# Patient Record
Sex: Female | Born: 1970 | Race: White | Hispanic: No | Marital: Married | Smoking: Never smoker
Health system: Southern US, Community
[De-identification: ages and names within clinical notes are randomized; demographics above are authoritative.]

## PROBLEM LIST (undated history)

## (undated) DIAGNOSIS — R059 Cough, unspecified: Secondary | ICD-10-CM

## (undated) DIAGNOSIS — T4145XA Adverse effect of unspecified anesthetic, initial encounter: Secondary | ICD-10-CM

## (undated) DIAGNOSIS — F32A Depression, unspecified: Secondary | ICD-10-CM

## (undated) DIAGNOSIS — D802 Selective deficiency of immunoglobulin A [IgA]: Secondary | ICD-10-CM

## (undated) DIAGNOSIS — G478 Other sleep disorders: Secondary | ICD-10-CM

## (undated) DIAGNOSIS — T8859XA Other complications of anesthesia, initial encounter: Secondary | ICD-10-CM

## (undated) DIAGNOSIS — R05 Cough: Secondary | ICD-10-CM

## (undated) DIAGNOSIS — F419 Anxiety disorder, unspecified: Secondary | ICD-10-CM

## (undated) DIAGNOSIS — M542 Cervicalgia: Secondary | ICD-10-CM

## (undated) DIAGNOSIS — D649 Anemia, unspecified: Secondary | ICD-10-CM

## (undated) DIAGNOSIS — J45909 Unspecified asthma, uncomplicated: Secondary | ICD-10-CM

## (undated) DIAGNOSIS — Z9889 Other specified postprocedural states: Secondary | ICD-10-CM

## (undated) DIAGNOSIS — N943 Premenstrual tension syndrome: Secondary | ICD-10-CM

## (undated) DIAGNOSIS — R112 Nausea with vomiting, unspecified: Secondary | ICD-10-CM

## (undated) DIAGNOSIS — K811 Chronic cholecystitis: Secondary | ICD-10-CM

## (undated) DIAGNOSIS — L409 Psoriasis, unspecified: Secondary | ICD-10-CM

## (undated) DIAGNOSIS — M25519 Pain in unspecified shoulder: Secondary | ICD-10-CM

## (undated) DIAGNOSIS — G500 Trigeminal neuralgia: Secondary | ICD-10-CM

## (undated) DIAGNOSIS — H9319 Tinnitus, unspecified ear: Secondary | ICD-10-CM

## (undated) DIAGNOSIS — L404 Guttate psoriasis: Secondary | ICD-10-CM

## (undated) DIAGNOSIS — F329 Major depressive disorder, single episode, unspecified: Secondary | ICD-10-CM

## (undated) DIAGNOSIS — J3489 Other specified disorders of nose and nasal sinuses: Secondary | ICD-10-CM

## (undated) DIAGNOSIS — J302 Other seasonal allergic rhinitis: Secondary | ICD-10-CM

## (undated) DIAGNOSIS — L309 Dermatitis, unspecified: Secondary | ICD-10-CM

## (undated) DIAGNOSIS — Z8489 Family history of other specified conditions: Secondary | ICD-10-CM

## (undated) DIAGNOSIS — J069 Acute upper respiratory infection, unspecified: Secondary | ICD-10-CM

## (undated) HISTORY — DX: Acute upper respiratory infection, unspecified: J06.9

## (undated) HISTORY — DX: Unspecified asthma, uncomplicated: J45.909

## (undated) HISTORY — DX: Depression, unspecified: F32.A

## (undated) HISTORY — DX: Premenstrual tension syndrome: N94.3

## (undated) HISTORY — DX: Cervicalgia: M54.2

## (undated) HISTORY — PX: WISDOM TOOTH EXTRACTION: SHX21

## (undated) HISTORY — DX: Major depressive disorder, single episode, unspecified: F32.9

## (undated) HISTORY — DX: Pain in unspecified shoulder: M25.519

## (undated) HISTORY — DX: Anxiety disorder, unspecified: F41.9

## (undated) HISTORY — DX: Selective deficiency of immunoglobulin a (iga): D80.2

## (undated) HISTORY — DX: Trigeminal neuralgia: G50.0

## (undated) HISTORY — DX: Other seasonal allergic rhinitis: J30.2

## (undated) HISTORY — DX: Dermatitis, unspecified: L30.9

## (undated) HISTORY — DX: Other specified disorders of nose and nasal sinuses: J34.89

## (undated) HISTORY — DX: Other sleep disorders: G47.8

## (undated) HISTORY — DX: Anemia, unspecified: D64.9

## (undated) HISTORY — DX: Guttate psoriasis: L40.4

## (undated) SURGERY — ERCP, WITH INTERVENTION IF INDICATED
Anesthesia: General

---

## 1991-01-05 DIAGNOSIS — L404 Guttate psoriasis: Secondary | ICD-10-CM

## 1991-01-05 HISTORY — DX: Guttate psoriasis: L40.4

## 2011-01-07 ENCOUNTER — Other Ambulatory Visit: Payer: Self-pay | Admitting: Otolaryngology

## 2011-01-07 DIAGNOSIS — R1310 Dysphagia, unspecified: Secondary | ICD-10-CM

## 2011-01-07 DIAGNOSIS — R220 Localized swelling, mass and lump, head: Secondary | ICD-10-CM

## 2011-01-07 DIAGNOSIS — F458 Other somatoform disorders: Secondary | ICD-10-CM

## 2011-01-12 ENCOUNTER — Ambulatory Visit
Admission: RE | Admit: 2011-01-12 | Discharge: 2011-01-12 | Disposition: A | Discharge status: Home / Self Care | Payer: BC Managed Care – PPO | Source: Ambulatory Visit | Attending: Otolaryngology | Admitting: Otolaryngology

## 2011-01-12 DIAGNOSIS — F458 Other somatoform disorders: Secondary | ICD-10-CM

## 2011-01-12 DIAGNOSIS — R1310 Dysphagia, unspecified: Secondary | ICD-10-CM

## 2011-01-12 DIAGNOSIS — R221 Localized swelling, mass and lump, neck: Secondary | ICD-10-CM

## 2011-01-12 MED ORDER — IOHEXOL 300 MG/ML  SOLN
75.0000 mL | Freq: Once | INTRAMUSCULAR | Status: AC | PRN
  Administered 2011-01-12: 75 mL via INTRAVENOUS

## 2011-12-16 ENCOUNTER — Encounter: Payer: Self-pay | Admitting: Obstetrics and Gynecology

## 2011-12-16 ENCOUNTER — Ambulatory Visit (INDEPENDENT_AMBULATORY_CARE_PROVIDER_SITE_OTHER): Payer: BC Managed Care – PPO | Admitting: Obstetrics and Gynecology

## 2011-12-16 VITALS — BP 130/70 | HR 80 | Ht 65.75 in | Wt 147.0 lb

## 2011-12-16 DIAGNOSIS — Z124 Encounter for screening for malignant neoplasm of cervix: Secondary | ICD-10-CM

## 2011-12-16 MED ORDER — CYCLOBENZAPRINE HCL 5 MG PO TABS: 5.0000 mg | ORAL_TABLET | Freq: Three times a day (TID) | ORAL | Status: DC | PRN

## 2011-12-16 MED ORDER — BUPROPION HCL ER (XL) 150 MG PO TB24: 150.0000 mg | ORAL_TABLET | Freq: Every day | ORAL | Status: DC

## 2011-12-16 NOTE — Patient Instructions (Signed)
Muscle Strain °Muscle strain occurs when a muscle is stretched beyond its normal length. A small number of muscle fibers generally are torn. This is especially common in athletes. This happens when a sudden, violent force placed on a muscle stretches it too far. Usually, recovery from muscle strain takes 1 to 2 weeks. Complete healing will take 5 to 6 weeks.  °HOME CARE INSTRUCTIONS  °· While awake, apply ice to the sore muscle for the first 2 days after the injury. °· Put ice in a plastic bag. °· Place a towel between your skin and the bag. °· Leave the ice on for 15 to 20 minutes each hour. °· Do not use the strained muscle for several days, until you no longer have pain. °· You may wrap the injured area with an elastic bandage for comfort. Be careful not to wrap it too tightly. This may interfere with blood circulation or increase swelling. °· Only take over-the-counter or prescription medicines for pain, discomfort, or fever as directed by your caregiver. °SEEK MEDICAL CARE IF:  °You have increasing pain or swelling in the injured area. °MAKE SURE YOU:  °· Understand these instructions. °· Will watch your condition. °· Will get help right away if you are not doing well or get worse. °Document Released: 12/21/2004 Document Revised: 03/15/2011 Document Reviewed: 01/02/2011 °ExitCare® Patient Information ©2013 [employer]. ° °

## 2011-12-16 NOTE — Progress Notes (Signed)
Last Pap: 2007 per pt WNL: Yes Regular Periods:yes Contraception: vasectomy   Monthly Breast exam:no Tetanus<18yrs:yes Nl.Bladder Function:yes Daily BMs:yes Healthy Diet:yes Calcium:no Mammogram:no Date of Mammogram:  Exercise:yes Have often Exercise: 1-2 times per week  Seatbelt: yes Abuse at home: no Stressful work:no Sigmoid-colonoscopy: n/a Bone Density: No PCP: none, pt would like referral  Change in PMH: n/a  Change in FMH:n/a  BP 130/70  Pulse 80  Ht 5' 5.75" (1.67 m)  Wt 147 lb (66.679 kg)  BMI 23.91 kg/m2  LMP 12/06/2011 Pt with complaints:1.  She has seasonal affetive disorder.  Every winter she becomes depressed and doesn't feel like connecting with people.  She has crying spells. She can not concentrate and she is snippy with her children  No HI or SI.  She used zoloft in the the past and it didn't help.  wellbutrin helped to prevent pp depression.  This has been present for 20 yrs.  2. She has a muscle stran in her neck.  Massage and flexeril helps.  No sharp pain.  She thinks its form stress 3. She is having problems sleeping.  She does not use electronics one hr before bed.  Shehas no abnormal lights on.  No caffeine.  occ she exercises before dinner Physical Examination: General appearance - alert, well appearing, and in no distress Mental status - normal mood, behavior, speech, dress, motor activity, and thought processes Neck - supple, no significant adenopathy,  thyroid exam: thyroid is normal in size without nodules or tenderness Chest - clear to auscultation, no wheezes, rales or rhonchi, symmetric air entry Heart - normal rate and regular rhythm Abdomen - soft, nontender, nondistended, no masses or organomegaly Breasts - breasts appear normal, no suspicious masses, no skin or nipple changes or axillary nodes Pelvic - normal external genitalia, vulva, vagina, cervix, uterus and adnexa Rectal - normal rectal, no masses, rectal exam not indicated Back exam -  full range of motion, no tenderness, palpable spasm or pain on motion Neurological - alert, oriented, normal speech, no focal findings or movement disorder noted Musculoskeletal - no joint tenderness, deformity or swelling Extremities - no edema, redness or tenderness in the calves or thighs Skin - normal coloration and turgor, no rashes, no suspicious skin lesions noted Routine exam Depression.  Pt referred for psycho therapy and placed on wellbutrin.  Also told to consider phototherapy Problems sleeping.  She was told to try going to bed earlier and pt exercise at night Muscle strain.  Pt given flexeril.  She declined PT eval Pap sent yes Mammogram due yes scheduled vasectomy used for contraception RT 2 weeks

## 2011-12-17 LAB — PAP IG W/ RFLX HPV ASCU

## 2011-12-21 ENCOUNTER — Telehealth: Payer: Self-pay

## 2011-12-21 NOTE — Telephone Encounter (Signed)
Tc to pt regarding pap results, advised pt that pap was normal but yeast was found. Advised pt that she could be treated by either taking 1 pill or trying over the counter treatment. Pt opted to try over the counter treatment.

## 2011-12-22 ENCOUNTER — Ambulatory Visit
Admission: RE | Admit: 2011-12-22 | Discharge: 2011-12-22 | Disposition: A | Discharge status: Home / Self Care | Payer: BC Managed Care – PPO | Source: Ambulatory Visit | Attending: Obstetrics and Gynecology | Admitting: Obstetrics and Gynecology

## 2011-12-22 DIAGNOSIS — Z124 Encounter for screening for malignant neoplasm of cervix: Secondary | ICD-10-CM

## 2012-01-03 ENCOUNTER — Encounter: Payer: Self-pay | Admitting: Obstetrics and Gynecology

## 2012-01-10 ENCOUNTER — Encounter: Payer: BC Managed Care – PPO | Admitting: Obstetrics and Gynecology

## 2012-01-12 ENCOUNTER — Encounter: Payer: BC Managed Care – PPO | Admitting: Obstetrics and Gynecology

## 2012-01-27 ENCOUNTER — Encounter: Payer: BC Managed Care – PPO | Admitting: Obstetrics and Gynecology

## 2012-02-05 ENCOUNTER — Other Ambulatory Visit: Payer: Self-pay | Admitting: Obstetrics and Gynecology

## 2013-08-13 ENCOUNTER — Encounter (HOSPITAL_COMMUNITY): Payer: Self-pay | Admitting: Emergency Medicine

## 2013-08-13 ENCOUNTER — Emergency Department (HOSPITAL_COMMUNITY)
Admission: EM | Admit: 2013-08-13 | Discharge: 2013-08-13 | Disposition: A | Discharge status: Home / Self Care | Payer: BC Managed Care – PPO | Source: Home / Self Care | Attending: Family Medicine | Admitting: Family Medicine

## 2013-08-13 DIAGNOSIS — S61209A Unspecified open wound of unspecified finger without damage to nail, initial encounter: Secondary | ICD-10-CM

## 2013-08-13 DIAGNOSIS — IMO0002 Reserved for concepts with insufficient information to code with codable children: Secondary | ICD-10-CM

## 2013-08-13 DIAGNOSIS — S61011A Laceration without foreign body of right thumb without damage to nail, initial encounter: Secondary | ICD-10-CM

## 2013-08-13 NOTE — ED Provider Notes (Signed)
Medical screening examination/treatment/procedure(s) were performed by a resident physician or non-physician practitioner and as the supervising physician I was immediately available for consultation/collaboration.  Erienne Spelman, MD Family Medicine   Aevah Stansbery J Janye Maynor, MD 08/13/13 2138 

## 2013-08-13 NOTE — ED Notes (Signed)
Patient reports moving furniture, hit right thumb into the edge of a desk.  Partial avulsion to area beside the nail and laceration extending into nail.  Bleeding controlled.  Last tetanus was less than 2 years ago

## 2013-08-13 NOTE — Discharge Instructions (Signed)
Sutured Wound Care °Sutures are stitches that can be used to close wounds. Wound care helps prevent pain and infection.  °HOME CARE INSTRUCTIONS  °· Rest and elevate the injured area until all the pain and swelling are gone. °· Only take over-the-counter or prescription medicines for pain, discomfort, or fever as directed by your caregiver. °· After 48 hours, gently wash the area with mild soap and water once a day, or as directed. Rinse off the soap. Pat the area dry with a clean towel. Do not rub the wound. This may cause bleeding. °· Follow your caregiver's instructions for how often to change the bandage (dressing). Stop using a dressing after 2 days or after the wound stops draining. °· If the dressing sticks, moisten it with soapy water and gently remove it. °· Apply ointment on the wound as directed. °· Avoid stretching a sutured wound. °· Drink enough fluids to keep your urine clear or pale yellow. °· Follow up with your caregiver for suture removal as directed. °· Use sunscreen on your wound for the next 3 to 6 months so the scar will not darken. °SEEK IMMEDIATE MEDICAL CARE IF:  °· Your wound becomes red, swollen, hot, or tender. °· You have increasing pain in the wound. °· You have a red streak that extends from the wound. °· There is pus coming from the wound. °· You have a fever. °· You have shaking chills. °· There is a bad smell coming from the wound. °· You have persistent bleeding from the wound. °MAKE SURE YOU:  °· Understand these instructions. °· Will watch your condition. °· Will get help right away if you are not doing well or get worse. °Document Released: 01/29/2004 Document Revised: 03/15/2011 Document Reviewed: 04/26/2010 °ExitCare® Patient Information ©2015 [employer]. This information is not intended to replace advice given to you by your health care provider. Make sure you discuss any questions you have with your health care provider. ° °

## 2013-08-13 NOTE — ED Notes (Signed)
Patient laceration dressed with non adherent tefla and 1' coban.

## 2013-08-13 NOTE — ED Provider Notes (Signed)
CSN: [id number]     Arrival date & time 08/13/13  1055 History   First MD Initiated Contact with Patient 08/13/13 1119     Chief Complaint  Patient presents with  . Laceration   (Consider location/radiation/quality/duration/timing/severity/associated sxs/prior Treatment) HPI Comments: Last tetanus 1.5 years ago.  Patient is a 43 y.o. female presenting with skin laceration. The history is provided by the patient.  Laceration Location:  Hand Hand laceration location:  R finger Length (cm):  1 Depth:  Through dermis Quality: avulsion   Bleeding: controlled   Time since incident:  1 hour Injury mechanism: Cut on edge of desk while moving furniture at work P[employer].   Past Medical History  Diagnosis Date  . Depression   . Poor sleep pattern   . Neck pain   . Shoulder pain   . PMS (premenstrual syndrome)    Past Surgical History  Procedure Laterality Date  . Wisdom tooth extraction     Family History  Problem Relation Age of Onset  . Heart disease Paternal Grandfather   . Diabetes Paternal Grandfather   . Heart disease Paternal Grandmother   . Diabetes Paternal Grandmother   . Hernia Father   . Diabetes Father   . Other Father     joint problems  . Thyroid disease Mother   . Other Mother     blood transfusion  . Hepatitis C Mother     contracted from blood transfusion  . Thyroid disease Maternal Aunt   . Thyroid disease Maternal Aunt   . Other Paternal Aunt     joint problems   History  Substance Use Topics  . Smoking status: Never Smoker   . Smokeless tobacco: Not on file  . Alcohol Use: 1.0 - 2.0 oz/week    2-4 drink(s) per week   OB History   Grav Para Term Preterm Abortions TAB SAB Ect Mult Living   2 2        2      Review of Systems  All other systems reviewed and are negative.   Allergies  Other  Home Medications   Prior to Admission medications   Medication Sig Start Date End Date Taking? Authorizing Provider  buPROPion (WELLBUTRIN XL) 150 MG  24 hr tablet Take 1 tablet (150 mg total) by mouth daily. 12/16/11   Naima A Dillard, MD  cyclobenzaprine (FLEXERIL) 5 MG tablet Take 1 tablet (5 mg total) by mouth 3 (three) times daily as needed for muscle spasms. 12/16/11   Naima A Dillard, MD   BP 144/90  Pulse 86  Temp(Src) 98.8 F (37.1 C) (Oral)  Resp 16  SpO2 100%  LMP 08/13/2013 Physical Exam  Nursing note and vitals reviewed. Constitutional: She is oriented to person, place, and time. She appears well-developed and well-nourished. No distress.  HENT:  Head: Normocephalic and atraumatic.  Eyes: Conjunctivae are normal.  Cardiovascular: Normal rate.   Pulmonary/Chest: Effort normal.  Musculoskeletal: Normal range of motion.  Neurological: She is alert and oriented to person, place, and time.  Skin: Skin is warm and dry.  1 cm avulsion type laceration to distal lateral right thumb along edge of nailbed with partial laceration to lateral thumb nail.   Psychiatric: She has a normal mood and affect. Her behavior is normal.    ED Course  LACERATION REPAIR Date/Time: 08/13/2013 1:08 PM Performed by: Lemmie EvensPRESSON, Ikenna Ohms LEE H Authorized by: Lemmie EvensPRESSON, Aydenn Gervin LEE H Consent: Verbal consent obtained. written consent not obtained. Risks and benefits: risks, benefits  and alternatives were discussed Consent given by: patient Patient understanding: patient states understanding of the procedure being performed Patient identity confirmed: verbally with patient Time out: Immediately prior to procedure a "time out" was called to verify the correct patient, procedure, equipment, support staff and site/side marked as required. Body area: upper extremity Location details: right thumb Laceration length: 1 cm Foreign bodies: no foreign bodies Tendon involvement: none Nerve involvement: none Vascular damage: no Anesthesia: local infiltration Local anesthetic: lidocaine 2% without epinephrine Anesthetic total: 0.5 ml Patient sedated:  no Preparation: Patient was prepped and draped in the usual sterile fashion. Irrigation solution: saline Irrigation method: syringe Amount of cleaning: standard Debridement: none Degree of undermining: none Skin closure: 4-0 Prolene Number of sutures: 3 Technique: simple Approximation: close Approximation difficulty: simple Dressing: antibiotic ointment and tube gauze Patient tolerance: Patient tolerated the procedure well with no immediate complications.   (including critical care time) Labs Review Labs Reviewed - No data to display  Imaging Review No results found.   MDM   1. Laceration of right thumb, initial encounter    Laceration care instructions provided to patient. Suture removal in one week.     Ria Clock, [place] 08/13/13 1310

## 2013-08-20 ENCOUNTER — Emergency Department (INDEPENDENT_AMBULATORY_CARE_PROVIDER_SITE_OTHER)
Admission: EM | Admit: 2013-08-20 | Discharge: 2013-08-20 | Disposition: A | Discharge status: Home / Self Care | Payer: BC Managed Care – PPO | Source: Home / Self Care | Attending: Family Medicine | Admitting: Family Medicine

## 2013-08-20 ENCOUNTER — Encounter (HOSPITAL_COMMUNITY): Payer: Self-pay | Admitting: Emergency Medicine

## 2013-08-20 DIAGNOSIS — Z4802 Encounter for removal of sutures: Secondary | ICD-10-CM

## 2013-08-20 NOTE — ED Provider Notes (Signed)
Beth MuscaRachel Rivera is a 43 y.o. female who presents to Urgent Care today for suture removal. Patient had sutures placed into her left thumb about one week ago. She's feeling well. No pain.   Past Medical History  Diagnosis Date  . Depression   . Poor sleep pattern   . Neck pain   . Shoulder pain   . PMS (premenstrual syndrome)    History  Substance Use Topics  . Smoking status: Never Smoker   . Smokeless tobacco: Not on file  . Alcohol Use: 1.0 - 2.0 oz/week    2-4 drink(s) per week   ROS as above Medications: No current facility-administered medications for this encounter.   Current Outpatient Prescriptions  Medication Sig Dispense Refill  . buPROPion (WELLBUTRIN XL) 150 MG 24 hr tablet Take 1 tablet (150 mg total) by mouth daily.  30 tablet  0  . cyclobenzaprine (FLEXERIL) 5 MG tablet Take 1 tablet (5 mg total) by mouth 3 (three) times daily as needed for muscle spasms.  30 tablet  0    Exam:  BP 138/78  Pulse 96  Temp(Src) 98.5 F (36.9 C) (Oral)  Resp 14  SpO2 98%  LMP 08/13/2013 Gen: Well NAD Left thumb: well-appearing laceration. No erythema exudate. Minimally tender. Normal motion. 3 sutures removed No results found for this or any previous visit (from the past 24 hour(s)). No results found.  Assessment and Plan: 43 y.o. female with thumb laceration. Sutures removed. Wound care instructions provided. Followup as needed  Discussed warning signs or symptoms. Please see discharge instructions. Patient expresses understanding.   This note was created using Conservation officer, historic buildingsDragon voice recognition software. Any transcription errors are unintended.    Rodolph BongEvan S Iker Nuttall, MD 08/20/13 [phone]

## 2013-08-20 NOTE — Discharge Instructions (Signed)
Thank you for coming in today. Keep the wound covered in ointment.  The nail should grow back normally but they may be a small chance that it will look funny.    Suture Removal, Care After Refer to this sheet in the next few weeks. These instructions provide you with information on caring for yourself after your procedure. Your health care provider may also give you more specific instructions. Your treatment has been planned according to current medical practices, but problems sometimes occur. Call your health care provider if you have any problems or questions after your procedure. WHAT TO EXPECT AFTER THE PROCEDURE After your stitches (sutures) are removed, it is typical to have the following:  Some discomfort and swelling in the wound area.  Slight redness in the area. HOME CARE INSTRUCTIONS   If you have skin adhesive strips over the wound area, do not take the strips off. They will fall off on their own in a few days. If the strips remain in place after 14 days, you may remove them.  Change any bandages (dressings) at least once a day or as directed by your health care provider. If the bandage sticks, soak it off with warm, soapy water.  Apply cream or ointment only as directed by your health care provider. If using cream or ointment, wash the area with soap and water 2 times a day to remove all the cream or ointment. Rinse off the soap and pat the area dry with a clean towel.  Keep the wound area dry and clean. If the bandage becomes wet or dirty, or if it develops a bad smell, change it as soon as possible.  Continue to protect the wound from injury.  Use sunscreen when out in the sun. New scars become sunburned easily. SEEK MEDICAL CARE IF:  You have increasing redness, swelling, or pain in the wound.  You see pus coming from the wound.  You have a fever.  You notice a bad smell coming from the wound or dressing.  Your wound breaks open (edges not staying  together). Document Released: 09/15/2000 Document Revised: 10/11/2012 Document Reviewed: 08/02/2012 [facility] Patient Information 2015 [place], [place]. This information is not intended to replace advice given to you by your health care provider. Make sure you discuss any questions you have with your health care provider.

## 2013-08-20 NOTE — ED Notes (Signed)
Pt is here for a f/u and to have stitches removed from left thumb [facility] Vo[facility] no new concerns Alert; no signs of acute distress.

## 2013-11-05 ENCOUNTER — Encounter (HOSPITAL_COMMUNITY): Payer: Self-pay | Admitting: Emergency Medicine

## 2015-01-03 ENCOUNTER — Encounter (HOSPITAL_COMMUNITY): Payer: Self-pay | Admitting: Oncology

## 2015-01-03 ENCOUNTER — Emergency Department (HOSPITAL_COMMUNITY): Payer: BLUE CROSS/BLUE SHIELD

## 2015-01-03 ENCOUNTER — Emergency Department (HOSPITAL_COMMUNITY)
Admission: EM | Admit: 2015-01-03 | Discharge: 2015-01-03 | Disposition: A | Discharge status: Home / Self Care | Payer: BLUE CROSS/BLUE SHIELD | Attending: Emergency Medicine | Admitting: Emergency Medicine

## 2015-01-03 DIAGNOSIS — N390 Urinary tract infection, site not specified: Secondary | ICD-10-CM | POA: Insufficient documentation

## 2015-01-03 DIAGNOSIS — Z8669 Personal history of other diseases of the nervous system and sense organs: Secondary | ICD-10-CM | POA: Diagnosis not present

## 2015-01-03 DIAGNOSIS — F329 Major depressive disorder, single episode, unspecified: Secondary | ICD-10-CM | POA: Diagnosis not present

## 2015-01-03 DIAGNOSIS — K8070 Calculus of gallbladder and bile duct without cholecystitis without obstruction: Secondary | ICD-10-CM | POA: Insufficient documentation

## 2015-01-03 DIAGNOSIS — Z8742 Personal history of other diseases of the female genital tract: Secondary | ICD-10-CM | POA: Insufficient documentation

## 2015-01-03 DIAGNOSIS — R1011 Right upper quadrant pain: Secondary | ICD-10-CM | POA: Diagnosis present

## 2015-01-03 LAB — COMPREHENSIVE METABOLIC PANEL
ALBUMIN: 4.2 g/dL (ref 3.5–5.0)
ALT: 15 U/L (ref 14–54)
AST: 20 U/L (ref 15–41)
Alkaline Phosphatase: 53 U/L (ref 38–126)
Anion gap: 7 (ref 5–15)
BUN: 10 mg/dL (ref 6–20)
CALCIUM: 8.8 mg/dL — AB (ref 8.9–10.3)
CHLORIDE: 104 mmol/L (ref 101–111)
CO2: 27 mmol/L (ref 22–32)
CREATININE: 0.72 mg/dL (ref 0.44–1.00)
GFR calc Af Amer: >60 mL/min (ref >60)
GFR calc non Af Amer: >60 mL/min (ref >60)
GLUCOSE: 108 mg/dL — AB (ref 65–99)
Potassium: 3.8 mmol/L (ref 3.5–5.1)
SODIUM: 138 mmol/L (ref 135–145)
Total Bilirubin: 0.5 mg/dL (ref 0.3–1.2)
Total Protein: 7.5 g/dL (ref 6.5–8.1)

## 2015-01-03 LAB — CBC
HCT: 35.5 % — ABNORMAL LOW (ref 36.0–46.0)
HEMOGLOBIN: 11.5 g/dL — AB (ref 12.0–15.0)
MCH: 27.4 pg (ref 26.0–34.0)
MCHC: 32.4 g/dL (ref 30.0–36.0)
MCV: 84.5 fL (ref 78.0–100.0)
Platelets: 300 10*3/uL (ref 150–400)
RBC: 4.2 MIL/uL (ref 3.87–5.11)
RDW: 15.5 % (ref 11.5–15.5)
WBC: 6.3 10*3/uL (ref 4.0–10.5)

## 2015-01-03 LAB — URINE MICROSCOPIC-ADD ON

## 2015-01-03 LAB — URINALYSIS, ROUTINE W REFLEX MICROSCOPIC
Bilirubin Urine: NEGATIVE
GLUCOSE, UA: NEGATIVE mg/dL
Ketones, ur: NEGATIVE mg/dL
Leukocytes, UA: NEGATIVE
Nitrite: POSITIVE — AB
Protein, ur: NEGATIVE mg/dL
SPECIFIC GRAVITY, URINE: 1.021 (ref 1.005–1.030)
pH: 6 (ref 5.0–8.0)

## 2015-01-03 LAB — LIPASE, BLOOD: Lipase: 40 U/L (ref 11–51)

## 2015-01-03 MED ORDER — HYDROCODONE-ACETAMINOPHEN 5-325 MG PO TABS: 1.0000 | ORAL_TABLET | Freq: Four times a day (QID) | ORAL | Status: DC | PRN

## 2015-01-03 MED ORDER — CEPHALEXIN 500 MG PO CAPS: 500.0000 mg | ORAL_CAPSULE | Freq: Three times a day (TID) | ORAL | Status: DC

## 2015-01-03 MED ORDER — CEPHALEXIN 500 MG PO CAPS
500.0000 mg | ORAL_CAPSULE | Freq: Once | ORAL | Status: AC
  Administered 2015-01-03: 500 mg via ORAL
  Filled 2015-01-03: qty 1

## 2015-01-03 NOTE — Discharge Instructions (Signed)
Take motrin for pain.  Take vicodin for severe pain.  Take keflex three times daily for a week for UTI.   See general surgery for follow up.   Return to ER if you have severe abdominal pain, vomiting, fevers.    Biliary Colic Biliary colic is a pain in the upper abdomen. The pain:  Is usually felt on the right side of the abdomen, but it may also be felt in the center of the abdomen, just below the breastbone (sternum).  May spread back toward the right shoulder blade.  May be steady or irregular.  May be accompanied by nausea and vomiting. Most of the time, the pain goes away in 1-5 hours. After the most intense pain passes, the abdomen may continue to ache mildly for about 24 hours. Biliary colic is caused by a blockage in the bile duct. The bile duct is a pathway that carries bile--a liquid that helps to digest fats--from the gallbladder to the small intestine. Biliary colic usually occurs after eating, when the digestive system demands bile. The pain develops when muscle cells contract forcefully to try to move the blockage so that bile can get by. HOME CARE INSTRUCTIONS  Take medicines only as directed by your health care provider.  Drink enough fluid to keep your urine clear or pale yellow.  Avoid fatty, greasy, and fried foods. These kinds of foods increase your body's demand for bile.  Avoid any foods that make your pain worse.  Avoid overeating.  Avoid having a large meal after fasting. SEEK MEDICAL CARE IF:  You develop a fever.  Your pain gets worse.  You vomit.  You develop nausea that prevents you from eating and drinking. SEEK IMMEDIATE MEDICAL CARE IF:  You suddenly develop a fever and shaking chills.  You develop a yellowish discoloration (jaundice) of:  Skin.  Whites of the eyes.  Mucous membranes.  You have continuous or severe pain that is not relieved with medicines.  You have nausea and vomiting that is not relieved with  medicines.  You develop dizziness or you faint.   This information is not intended to replace advice given to you by your health care provider. Make sure you discuss any questions you have with your health care provider.   Document Released: 05/24/2005 Document Revised: 05/07/2014 Document Reviewed: 10/02/2013 Else[employer] Interactive Patient Education [employer]2016 [employer]

## 2015-01-03 NOTE — ED Provider Notes (Signed)
CSN: [id number]     Arrival date & time 01/03/15  1850 History   First MD Initiated Contact with Patient 01/03/15 2106     Chief Complaint  Patient presents with  . Abdominal Pain     (Consider location/radiation/quality/duration/timing/severity/associated sxs/prior Treatment) The history is provided by the patient.  Beth Rivera is a 44 y.o. female hx of depression, here with RUQ pain, flank pain. Patient states that she has intermittent right upper quadrant pain radiating to the right flank for several weeks. She had about 4-5 episodes since November. She states that usually is at night and woke her up from sleep. She did have several episodes of pain after she eats usually about 2-3 hours after she eats. Today she ate lunch around noon and had some pain around 5 PM that was more severe than usual. She was nauseated but did not vomit. She denies any urinary symptoms. No history of gallstones but she thought she may have some gallstones.    Past Medical History  Diagnosis Date  . Depression   . Poor sleep pattern   . Neck pain   . Shoulder pain   . PMS (premenstrual syndrome)    Past Surgical History  Procedure Laterality Date  . Wisdom tooth extraction     Family History  Problem Relation Age of Onset  . Heart disease Paternal Grandfather   . Diabetes Paternal Grandfather   . Heart disease Paternal Grandmother   . Diabetes Paternal Grandmother   . Hernia Father   . Diabetes Father   . Other Father     joint problems  . Thyroid disease Mother   . Other Mother     blood transfusion  . Hepatitis C Mother     contracted from blood transfusion  . Thyroid disease Maternal Aunt   . Thyroid disease Maternal Aunt   . Other Paternal Aunt     joint problems   Social History  Substance Use Topics  . Smoking status: Never Smoker   . Smokeless tobacco: Never Used  . Alcohol Use: 1.0 - 2.0 oz/week    2-4 Standard drinks or equivalent per week   OB History    Gravida Para  Term Preterm AB TAB SAB Ectopic Multiple Living   2 2        2      Review of Systems  Gastrointestinal: Positive for abdominal pain.  All other systems reviewed and are negative.     Allergies  Other  Home Medications   Prior to Admission medications   Medication Sig Start Date End Date Taking? Authorizing Provider  famotidine (PEPCID) 20 MG tablet Take 20 mg by mouth daily as needed for heartburn or indigestion.   Yes Historical Provider, MD  ibuprofen (ADVIL,MOTRIN) 200 MG tablet Take 400 mg by mouth every 6 (six) hours as needed for moderate pain.   Yes Historical Provider, MD  buPROPion (WELLBUTRIN XL) 150 MG 24 hr tablet Take 1 tablet (150 mg total) by mouth daily. Patient not taking: Reported on 01/03/2015 12/16/11   Jaymes GraffNaima Dillard, MD  cyclobenzaprine (FLEXERIL) 5 MG tablet Take 1 tablet (5 mg total) by mouth 3 (three) times daily as needed for muscle spasms. Patient not taking: Reported on 01/03/2015 12/16/11   Jaymes GraffNaima Dillard, MD   BP 137/92 mmHg  Pulse 80  Temp(Src) 98.3 F (36.8 C) (Oral)  Resp 20  Ht 5\' 6"  (1.676 m)  Wt 150 lb (68.04 kg)  BMI 24.22 kg/m2  SpO2 100%  LMP  11/12/2014 (Approximate) Physical Exam  Constitutional: She is oriented to person, place, and time. She appears well-developed and well-nourished.  HENT:  Head: Normocephalic.  Mouth/Throat: Oropharynx is clear and moist.  Eyes: Conjunctivae are normal. Pupils are equal, round, and reactive to light.  Neck: Normal range of motion. Neck supple.  Cardiovascular: Normal rate, regular rhythm and normal heart sounds.   Pulmonary/Chest: Effort normal and breath sounds normal. No respiratory distress. She has no wheezes. She has no rales.  Abdominal: Soft. Bowel sounds are normal.  Mild RUQ tenderness, no rebound. No CVAT   Musculoskeletal: Normal range of motion. She exhibits no edema or tenderness.  Neurological: She is alert and oriented to person, place, and time. No cranial nerve deficit.  Coordination normal.  Skin: Skin is warm and dry.  Psychiatric: She has a normal mood and affect. Her behavior is normal. Judgment and thought content normal.  Nursing note and vitals reviewed.   ED Course  Procedures (including critical care time) Labs Review Labs Reviewed  COMPREHENSIVE METABOLIC PANEL - Abnormal; Notable for the following:    Glucose, Bld 108 (*)    Calcium 8.8 (*)    All other components within normal limits  CBC - Abnormal; Notable for the following:    Hemoglobin 11.5 (*)    HCT 35.5 (*)    All other components within normal limits  URINALYSIS, ROUTINE W REFLEX MICROSCOPIC (NOT AT [facility]) - Abnormal; Notable for the following:    APPearance CLOUDY (*)    Hgb urine dipstick TRACE (*)    Nitrite POSITIVE (*)    All other components within normal limits  URINE MICROSCOPIC-ADD ON - Abnormal; Notable for the following:    Squamous Epithelial / LPF 0-5 (*)    Bacteria, UA MANY (*)    Crystals CA OXALATE CRYSTALS (*)    All other components within normal limits  LIPASE, BLOOD    Imaging Review US Abdomen Complete  01/03/2015  CLINICAL DATA:  44 year old female with history of right upper quadrant abdominal pain for the past 6 weeks. EXAM: ABDOMEN ULTRASOUND COMPLETE COMPARISON:  No priors. FINDINGS: Gallbladder: Multiple echogenic foci with posterior acoustic shadowing, compatible with gallstones, largest of which measures up to 3.1 cm. Gallbladder does not appear distended. Gallbladder wall thickness is normal at 2.4 mm. No pericholecystic fluid. Per report from the sonographer, the patient did not exhibit a sonographic Murphy's sign on examination. Common bile duct: Diameter: 2 mm in the porta hepatis. Liver: No focal lesion identified. Within normal limits in parenchymal echogenicity. IVC: No abnormality visualized. Pancreas: Visualized portion unremarkable. Spleen: Size and appearance within normal limits.  6.7 cm in length. Right Kidney: Length: 12.3 cm.  Echogenicity within normal limits. No mass or hydronephrosis visualized. Left Kidney: Length: 11.7 cm. Echogenicity within normal limits. No mass or hydronephrosis visualized. Abdominal aorta: No aneurysm visualized. Other findings: None. IMPRESSION: 1. Study is positive for cholelithiasis, but there are no findings to suggest an acute cholecystitis at this time. 2. Otherwise normal abdominal ultrasound. Electronically Signed   By: Trudie Reed M.D.   On: 01/03/2015 22:05   I have personally reviewed and evaluated these images and lab results as part of my medical decision-making.   EKG Interpretation None      MDM   Final diagnoses:  None   Beth Rivera is a 44 y.o. female here with RUQ pain. Likely biliary colic vs renal colic. Will get labs, UA, RUQ [place].   10:46 PM US showed cholelithiasis no  acute chole. UA + UTI but has no CVAT. WBC nl. LFTs unremarkable. Doesn't want pain meds in the ED. Will dc home with keflex, vicodin, surgery f/u.      Richardean Canal, MD 01/03/15 [phone]

## 2015-01-03 NOTE — ED Notes (Signed)
Per pt she has had RUQ pain w/ radiation to back x 6 weeks.  +nausea.  Pt has an appointment w/ GI however it is not until February.  Per pt GI told her if the sx became worse to go to urgent care which pt did and they sent her here.  Pt has not had any scans done however per google, believes the pain to be caused by her gallbladder.

## 2015-01-16 ENCOUNTER — Ambulatory Visit: Payer: Self-pay | Admitting: Surgery

## 2015-01-29 ENCOUNTER — Other Ambulatory Visit (HOSPITAL_COMMUNITY): Payer: Self-pay | Admitting: *Deleted

## 2015-01-29 NOTE — Patient Instructions (Addendum)
Beth Rivera  01/29/2015   Your procedure is scheduled on: 02-03-15  Report to [facility]  Entrance take [facility]  elevators to 3rd floor to  Short Stay Center at 530 AM.  Call this number if you have problems the morning of surgery [phone]   Remember: ONLY 1 PERSON MAY GO WITH YOU TO SHORT STAY TO GET  READY MORNING OF YOUR SURGERY.  Do not eat food or drink liquids :After Midnight.   [facility] [phone]   Take these medicines the morning of surgery with A SIP OF WATER: HYDROCODONE IF NEEDED                               You may not have any metal on your body including hair pins and              piercings  Do not wear jewelry, make-up, lotions, powders or perfumes, deodorant             Do not wear nail polish.  Do not shave  48 hours prior to surgery.              Men may shave face and neck.   Do not bring valuables to the hospital. [place] IS NOT             RESPONSIBLE   FOR VALUABLES.  Contacts, dentures or bridgework may not be worn into surgery.  Leave suitcase in the car. After surgery it may be brought to your room.                  Please read over the following fact sheets you were given: _____________________________________________________________________             [facility] - Preparing for Surgery Before surgery, you can play an important role.  Because skin is not sterile, your skin needs to be as free of germs as possible.  You can reduce the number of germs on your skin by washing with CHG (chlorahexidine gluconate) soap before surgery.  CHG is an antiseptic cleaner which kills germs and bonds with the skin to continue killing germs even after washing. Please DO NOT use if you have an allergy to CHG or antibacterial soaps.  If your skin becomes reddened/irritated stop using the CHG and inform your nurse when you arrive at Short Stay. Do not shave (including legs and underarms) for at least 48 hours  prior to the first CHG shower.  You may shave your face/neck. Please follow these instructions carefully:  1.  Shower with CHG Soap the night before surgery and the  morning of Surgery.  2.  If you choose to wash your hair, wash your hair first as usual with your  normal  shampoo.  3.  After you shampoo, rinse your hair and body thoroughly to remove the  shampoo.                           4.  Use CHG as you would any other liquid soap.  You can apply chg directly  to the skin and wash                       Gently with a scrungie or clean washcloth.  5.  Apply the CHG Soap to your body ONLY FROM THE NECK DOWN.   Do not use on face/ open                           Wound or open sores. Avoid contact with eyes, ears mouth and genitals (private parts).                       Wash face,  Genitals (private parts) with your normal soap.             6.  Wash thoroughly, paying special attention to the area where your surgery  will be performed.  7.  Thoroughly rinse your body with warm water from the neck down.  8.  DO NOT shower/wash with your normal soap after using and rinsing off  the CHG Soap.                9.  Pat yourself dry with a clean towel.            10.  Wear clean pajamas.            11.  Place clean sheets on your bed the night of your first shower and do not  sleep with pets. Day of Surgery : Do not apply any lotions/deodorants the morning of surgery.  Please wear clean clothes to the hospital/surgery center.  FAILURE TO FOLLOW THESE INSTRUCTIONS MAY RESULT IN THE CANCELLATION OF YOUR SURGERY PATIENT SIGNATURE_________________________________  NURSE SIGNATURE__________________________________  ________________________________________________________________________

## 2015-01-31 ENCOUNTER — Encounter (HOSPITAL_COMMUNITY)
Admission: RE | Admit: 2015-01-31 | Discharge: 2015-01-31 | Disposition: A | Discharge status: Home / Self Care | Payer: BLUE CROSS/BLUE SHIELD | Source: Ambulatory Visit | Attending: Surgery | Admitting: Surgery

## 2015-01-31 ENCOUNTER — Encounter (HOSPITAL_COMMUNITY): Payer: Self-pay

## 2015-01-31 DIAGNOSIS — Z8379 Family history of other diseases of the digestive system: Secondary | ICD-10-CM | POA: Diagnosis not present

## 2015-01-31 DIAGNOSIS — Z79899 Other long term (current) drug therapy: Secondary | ICD-10-CM | POA: Diagnosis not present

## 2015-01-31 DIAGNOSIS — K8020 Calculus of gallbladder without cholecystitis without obstruction: Secondary | ICD-10-CM | POA: Diagnosis present

## 2015-01-31 DIAGNOSIS — M549 Dorsalgia, unspecified: Secondary | ICD-10-CM | POA: Diagnosis not present

## 2015-01-31 DIAGNOSIS — Z79891 Long term (current) use of opiate analgesic: Secondary | ICD-10-CM | POA: Diagnosis not present

## 2015-01-31 DIAGNOSIS — M1990 Unspecified osteoarthritis, unspecified site: Secondary | ICD-10-CM | POA: Diagnosis not present

## 2015-01-31 DIAGNOSIS — K8010 Calculus of gallbladder with chronic cholecystitis without obstruction: Secondary | ICD-10-CM | POA: Diagnosis not present

## 2015-01-31 DIAGNOSIS — Z23 Encounter for immunization: Secondary | ICD-10-CM | POA: Diagnosis not present

## 2015-01-31 HISTORY — DX: Cough, unspecified: R05.9

## 2015-01-31 HISTORY — DX: Tinnitus, unspecified ear: H93.19

## 2015-01-31 HISTORY — DX: Other complications of anesthesia, initial encounter: T88.59XA

## 2015-01-31 HISTORY — DX: Nausea with vomiting, unspecified: R11.2

## 2015-01-31 HISTORY — DX: Adverse effect of unspecified anesthetic, initial encounter: T41.45XA

## 2015-01-31 HISTORY — DX: Cough: R05

## 2015-01-31 HISTORY — DX: Family history of other specified conditions: Z84.89

## 2015-01-31 HISTORY — DX: Other specified postprocedural states: Z98.890

## 2015-01-31 HISTORY — DX: Chronic cholecystitis: K81.1

## 2015-01-31 HISTORY — DX: Psoriasis, unspecified: L40.9

## 2015-01-31 LAB — CBC
HCT: 38.3 % (ref 36.0–46.0)
Hemoglobin: 12.6 g/dL (ref 12.0–15.0)
MCH: 27.6 pg (ref 26.0–34.0)
MCHC: 32.9 g/dL (ref 30.0–36.0)
MCV: 83.8 fL (ref 78.0–100.0)
PLATELETS: 343 10*3/uL (ref 150–400)
RBC: 4.57 MIL/uL (ref 3.87–5.11)
RDW: 15.4 % (ref 11.5–15.5)
WBC: 5.9 10*3/uL (ref 4.0–10.5)

## 2015-01-31 LAB — HCG, SERUM, QUALITATIVE: PREG SERUM: NEGATIVE

## 2015-02-02 ENCOUNTER — Encounter (HOSPITAL_COMMUNITY): Payer: Self-pay | Admitting: Surgery

## 2015-02-02 DIAGNOSIS — K8010 Calculus of gallbladder with chronic cholecystitis without obstruction: Secondary | ICD-10-CM | POA: Diagnosis present

## 2015-02-02 NOTE — H&P (Signed)
General Surgery [facility]  Beth Rivera. Beth Rivera DOB: 1970-07-13 Married / Language: English / Race: White Female  History of Present Illness Patient words: gallbladder.  The patient is a 45 year old female who presents for evaluation of gallstones.  Patient is referred by Dr. Silverio Lay in the emergency department for treatment of symptomatic cholelithiasis. Patient had developed intermittent right upper quadrant abdominal pain radiating to the back in November 2016. Some of the episodes have lasted as long as 2 hours and been associated with nausea and vomiting. Patient denies fevers or chills. She denies jaundice or acholic stools. She has had no prior abdominal surgery. There is a family history of cholecystectomy in the patient's father. Patient underwent ultrasound on January 03, 2015. This showed multiple gallstones, the largest of which measured 3.1 cm. There was no biliary dilatation and no sign of acute cholecystitis. Patient now presents for consideration of laparoscopic cholecystectomy. Patient denies any prior history of hepatobiliary or pancreatic disease. Patient is taking Vicodin intermittently with episodes of biliary colic.  Other Problems Arthritis Back Pain Cholelithiasis Depression  Diagnostic Studies History Colonoscopy never Mammogram within last year Pap Smear 1-5 years ago  Allergies No Known Drug Allergies01/12/2015  Medication History Cephalexin (  Capsule, Oral) Active. Hydrocodone-Acetaminophen (5-325MG  Tablet, Oral) Active. Flexeril (  Tablet, Oral) Active. Medications Reconciled  Social History Alcohol use Occasional alcohol use. Caffeine use Carbonated beverages, Coffee, Tea. No drug use Tobacco use Never smoker.  Family History Breast Cancer Family Members In General. Depression Brother, Father, Mother. Diabetes Mellitus Father. Heart Disease Father. Respiratory Condition  Brother. Thyroid problems Family Members In General, Mother.  Pregnancy / Birth History Age at menarche 13 years. Contraceptive History Contraceptive implant. Gravida 2 Irregular periods Maternal age 64-30 Para 2  Review of Systems General Present- Weight Loss. Not Present- Appetite Loss, Chills, Fatigue, Fever, Night Sweats and Weight Gain. Skin Present- Dryness. Not Present- Change in Wart/Mole, Hives, Jaundice, New Lesions, Non-Healing Wounds, Rash and Ulcer. HEENT Present- Ringing in the Ears, Seasonal Allergies and Wears glasses/contact lenses. Not Present- Earache, Hearing Loss, Hoarseness, Nose Bleed, Oral Ulcers, Sinus Pain, Sore Throat, Visual Disturbances and Yellow Eyes. Respiratory Present- Snoring. Not Present- Bloody sputum, Chronic Cough, Difficulty Breathing and Wheezing. Breast Not Present- Breast Mass, Breast Pain, Nipple Discharge and Skin Changes. Cardiovascular Not Present- Chest Pain, Difficulty Breathing Lying Down, Leg Cramps, Palpitations, Rapid Heart Rate, Shortness of Breath and Swelling of Extremities. Gastrointestinal Present- Abdominal Pain, Difficulty Swallowing, Excessive gas and Gets full quickly at meals. Not Present- Bloating, Bloody Stool, Change in Bowel Habits, Chronic diarrhea, Constipation, Hemorrhoids, Indigestion, Nausea, Rectal Pain and Vomiting. Female Genitourinary Not Present- Frequency, Nocturia, Painful Urination, Pelvic Pain and Urgency. Musculoskeletal Present- Back Pain. Not Present- Joint Pain, Joint Stiffness, Muscle Pain, Muscle Weakness and Swelling of Extremities. Neurological Present- Headaches. Not Present- Decreased Memory, Fainting, Numbness, Seizures, Tingling, Tremor, Trouble walking and Weakness. Endocrine Not Present- Cold Intolerance, Excessive Hunger, Hair Changes, Heat Intolerance, Hot flashes and New Diabetes. Hematology Not Present- Easy Bruising, Excessive bleeding, Gland problems, HIV and Persistent  Infections.  Vitals Weight: 148 lb Height: 66in Body Surface Area: 1.76 m Body Mass Index: 23.89 kg/m  Temp.: 98.22F(Oral)  Pulse: 80 (Regular)  BP: 116/78 (Sitting, Left Arm, Standard)  Physical Exam  General - appears comfortable, no distress; not diaphorectic  HEENT - normocephalic; sclerae clear, gaze conjugate; mucous membranes moist, dentition good; voice normal  Neck - symmetric on extension; no palpable anterior or posterior cervical adenopathy; no palpable  masses in the thyroid bed  Chest - clear bilaterally without rhonchi, rales, or wheeze  Cor - regular rhythm with normal rate; no significant murmur  Abd - soft without distension; no surgical incisions; no sign of hernia; no hepatosplenomegaly; no tenderness in the right upper quadrant; no masses  Ext - non-tender without significant edema or lymphedema  Neuro - grossly intact; no tremor   Assessment & Plan  CALCULUS OF GALLBLADDER WITH CHRONIC CHOLECYSTITIS WITHOUT OBSTRUCTION (K80.10)  Pt Education - Pamphlet Given - Laparoscopic Gallbladder Surgery: discussed with patient and provided information.  Patient is referred from the emergency department for treatment of symptomatic cholelithiasis. Patient is provided with written literature on gallbladder surgery to review at home.  Patient has had symptoms since November 2016. She has had intermittent episodes of biliary colic. She is interested in proceeding with laparoscopic cholecystectomy.  I have recommended laparoscopic cholecystectomy with intraoperative cholangiography. We have discussed risk and benefits of the procedure including the potential for open surgery. We have discussed the hospital stay to be anticipated. We have discussed postoperative recovery and return to normal activity. Patient understands and wishes to proceed in the near future.  The risks and benefits of the procedure have been discussed at length with the patient. The  patient understands the proposed procedure, potential alternative treatments, and the course of recovery to be expected. All of the patient's questions have been answered at this time. The patient wishes to proceed with surgery.  Velora Heckler, MD, [facility] Office: [phone]

## 2015-02-03 ENCOUNTER — Observation Stay (HOSPITAL_COMMUNITY)
Admission: RE | Admit: 2015-02-03 | Discharge: 2015-02-03 | Disposition: A | Discharge status: Home / Self Care | Payer: BLUE CROSS/BLUE SHIELD | Source: Ambulatory Visit | Attending: Surgery | Admitting: Surgery

## 2015-02-03 ENCOUNTER — Encounter (HOSPITAL_COMMUNITY)
Admission: RE | Disposition: A | Discharge status: Home / Self Care | Payer: Self-pay | Source: Ambulatory Visit | Attending: Surgery

## 2015-02-03 ENCOUNTER — Ambulatory Visit (HOSPITAL_COMMUNITY): Payer: BLUE CROSS/BLUE SHIELD | Admitting: Anesthesiology

## 2015-02-03 ENCOUNTER — Ambulatory Visit (HOSPITAL_COMMUNITY): Payer: BLUE CROSS/BLUE SHIELD

## 2015-02-03 ENCOUNTER — Encounter (HOSPITAL_COMMUNITY): Payer: Self-pay | Admitting: *Deleted

## 2015-02-03 DIAGNOSIS — M1990 Unspecified osteoarthritis, unspecified site: Secondary | ICD-10-CM | POA: Insufficient documentation

## 2015-02-03 DIAGNOSIS — K8010 Calculus of gallbladder with chronic cholecystitis without obstruction: Principal | ICD-10-CM | POA: Diagnosis present

## 2015-02-03 DIAGNOSIS — Z79899 Other long term (current) drug therapy: Secondary | ICD-10-CM | POA: Insufficient documentation

## 2015-02-03 DIAGNOSIS — Z79891 Long term (current) use of opiate analgesic: Secondary | ICD-10-CM | POA: Insufficient documentation

## 2015-02-03 DIAGNOSIS — Z419 Encounter for procedure for purposes other than remedying health state, unspecified: Secondary | ICD-10-CM

## 2015-02-03 DIAGNOSIS — Z23 Encounter for immunization: Secondary | ICD-10-CM | POA: Insufficient documentation

## 2015-02-03 DIAGNOSIS — Z8379 Family history of other diseases of the digestive system: Secondary | ICD-10-CM | POA: Insufficient documentation

## 2015-02-03 DIAGNOSIS — M549 Dorsalgia, unspecified: Secondary | ICD-10-CM | POA: Insufficient documentation

## 2015-02-03 HISTORY — PX: CHOLECYSTECTOMY: SHX55

## 2015-02-03 SURGERY — LAPAROSCOPIC CHOLECYSTECTOMY WITH INTRAOPERATIVE CHOLANGIOGRAM
Anesthesia: General | Site: Abdomen

## 2015-02-03 MED ORDER — ONDANSETRON HCL 4 MG/2ML IJ SOLN
INTRAMUSCULAR | Status: AC
  Filled 2015-02-03: qty 2

## 2015-02-03 MED ORDER — ROCURONIUM BROMIDE 100 MG/10ML IV SOLN
INTRAVENOUS | Status: DC | PRN
  Administered 2015-02-03: 40 mg via INTRAVENOUS

## 2015-02-03 MED ORDER — FENTANYL CITRATE (PF) 100 MCG/2ML IJ SOLN
INTRAMUSCULAR | Status: DC | PRN
  Administered 2015-02-03: 150 ug via INTRAVENOUS
  Administered 2015-02-03 (×2): 50 ug via INTRAVENOUS

## 2015-02-03 MED ORDER — DEXAMETHASONE SODIUM PHOSPHATE 4 MG/ML IJ SOLN
INTRAMUSCULAR | Status: DC | PRN
  Administered 2015-02-03: 10 mg via INTRAVENOUS

## 2015-02-03 MED ORDER — KCL IN DEXTROSE-NACL 20-5-0.45 MEQ/L-%-% IV SOLN
INTRAVENOUS | Status: DC
  Administered 2015-02-03: 1000 mL via INTRAVENOUS
  Filled 2015-02-03: qty 1000

## 2015-02-03 MED ORDER — BUPIVACAINE-EPINEPHRINE 0.5% -1:200000 IJ SOLN
INTRAMUSCULAR | Status: AC
  Filled 2015-02-03: qty 1

## 2015-02-03 MED ORDER — ROCURONIUM BROMIDE 100 MG/10ML IV SOLN
INTRAVENOUS | Status: AC
  Filled 2015-02-03: qty 1

## 2015-02-03 MED ORDER — LACTATED RINGERS IV SOLN
INTRAVENOUS | Status: DC | PRN
  Administered 2015-02-03 (×2): via INTRAVENOUS

## 2015-02-03 MED ORDER — CEFAZOLIN SODIUM-DEXTROSE 2-3 GM-% IV SOLR
2.0000 g | INTRAVENOUS | Status: AC
  Administered 2015-02-03: 2 g via INTRAVENOUS

## 2015-02-03 MED ORDER — LACTATED RINGERS IR SOLN
Status: DC | PRN
  Administered 2015-02-03: 1000 mL

## 2015-02-03 MED ORDER — MIDAZOLAM HCL 5 MG/5ML IJ SOLN
INTRAMUSCULAR | Status: DC | PRN
  Administered 2015-02-03: 2 mg via INTRAVENOUS

## 2015-02-03 MED ORDER — INFLUENZA VAC SPLIT QUAD 0.5 ML IM SUSY
0.5000 mL | PREFILLED_SYRINGE | INTRAMUSCULAR | Status: AC | PRN
  Administered 2015-02-03: 0.5 mL via INTRAMUSCULAR
  Filled 2015-02-03: qty 0.5

## 2015-02-03 MED ORDER — SUGAMMADEX SODIUM 200 MG/2ML IV SOLN
INTRAVENOUS | Status: DC | PRN
  Administered 2015-02-03: 200 mg via INTRAVENOUS

## 2015-02-03 MED ORDER — HYDROMORPHONE HCL 1 MG/ML IJ SOLN
0.2500 mg | INTRAMUSCULAR | Status: DC | PRN
  Administered 2015-02-03: 0.5 mg via INTRAVENOUS
  Administered 2015-02-03 (×2): 0.25 mg via INTRAVENOUS

## 2015-02-03 MED ORDER — INFLUENZA VAC SPLIT QUAD 0.5 ML IM SUSY: 0.5000 mL | PREFILLED_SYRINGE | INTRAMUSCULAR | Status: DC

## 2015-02-03 MED ORDER — PROPOFOL 10 MG/ML IV BOLUS
INTRAVENOUS | Status: AC
  Filled 2015-02-03: qty 20

## 2015-02-03 MED ORDER — CYCLOBENZAPRINE HCL 5 MG PO TABS: 5.0000 mg | ORAL_TABLET | Freq: Three times a day (TID) | ORAL | Status: DC | PRN

## 2015-02-03 MED ORDER — HYDROMORPHONE HCL 1 MG/ML IJ SOLN
INTRAMUSCULAR | Status: AC
  Filled 2015-02-03: qty 1

## 2015-02-03 MED ORDER — FENTANYL CITRATE (PF) 250 MCG/5ML IJ SOLN
INTRAMUSCULAR | Status: AC
  Filled 2015-02-03: qty 5

## 2015-02-03 MED ORDER — LIDOCAINE HCL (CARDIAC) 20 MG/ML IV SOLN
INTRAVENOUS | Status: AC
  Filled 2015-02-03: qty 5

## 2015-02-03 MED ORDER — PROPOFOL 10 MG/ML IV BOLUS
INTRAVENOUS | Status: DC | PRN
  Administered 2015-02-03: 160 mg via INTRAVENOUS

## 2015-02-03 MED ORDER — MIDAZOLAM HCL 2 MG/2ML IJ SOLN
INTRAMUSCULAR | Status: AC
  Filled 2015-02-03: qty 2

## 2015-02-03 MED ORDER — LIDOCAINE HCL (CARDIAC) 20 MG/ML IV SOLN
INTRAVENOUS | Status: DC | PRN
  Administered 2015-02-03: 50 mg via INTRAVENOUS

## 2015-02-03 MED ORDER — CEFAZOLIN SODIUM-DEXTROSE 2-3 GM-% IV SOLR
INTRAVENOUS | Status: AC
  Filled 2015-02-03: qty 50

## 2015-02-03 MED ORDER — DEXAMETHASONE SODIUM PHOSPHATE 10 MG/ML IJ SOLN
INTRAMUSCULAR | Status: AC
  Filled 2015-02-03: qty 1

## 2015-02-03 MED ORDER — ONDANSETRON 4 MG PO TBDP: 4.0000 mg | ORAL_TABLET | Freq: Four times a day (QID) | ORAL | Status: DC | PRN

## 2015-02-03 MED ORDER — SCOPOLAMINE 1 MG/3DAYS TD PT72
MEDICATED_PATCH | TRANSDERMAL | Status: AC
  Filled 2015-02-03: qty 1

## 2015-02-03 MED ORDER — PROMETHAZINE HCL 25 MG/ML IJ SOLN: 6.2500 mg | INTRAMUSCULAR | Status: DC | PRN

## 2015-02-03 MED ORDER — ONDANSETRON HCL 4 MG/2ML IJ SOLN: 4.0000 mg | Freq: Four times a day (QID) | INTRAMUSCULAR | Status: DC | PRN

## 2015-02-03 MED ORDER — SODIUM CHLORIDE 0.9 % IJ SOLN
INTRAMUSCULAR | Status: AC
  Filled 2015-02-03: qty 10

## 2015-02-03 MED ORDER — HYDROCODONE-ACETAMINOPHEN 5-325 MG PO TABS: 1.0000 | ORAL_TABLET | ORAL | Status: DC | PRN

## 2015-02-03 MED ORDER — IOHEXOL 300 MG/ML  SOLN
INTRAMUSCULAR | Status: DC | PRN
  Administered 2015-02-03: 50 mL via INTRAVENOUS
  Administered 2015-02-03: 16 mL

## 2015-02-03 MED ORDER — BUPROPION HCL ER (XL) 150 MG PO TB24
150.0000 mg | ORAL_TABLET | Freq: Every day | ORAL | Status: DC
  Administered 2015-02-03: 150 mg via ORAL
  Filled 2015-02-03: qty 1

## 2015-02-03 MED ORDER — ACETAMINOPHEN 325 MG PO TABS: 650.0000 mg | ORAL_TABLET | Freq: Four times a day (QID) | ORAL | Status: DC | PRN

## 2015-02-03 MED ORDER — SCOPOLAMINE 1 MG/3DAYS TD PT72
MEDICATED_PATCH | TRANSDERMAL | Status: DC | PRN
  Administered 2015-02-03: 1 via TRANSDERMAL

## 2015-02-03 MED ORDER — SUGAMMADEX SODIUM 200 MG/2ML IV SOLN
INTRAVENOUS | Status: AC
  Filled 2015-02-03: qty 2

## 2015-02-03 MED ORDER — ACETAMINOPHEN 650 MG RE SUPP
650.0000 mg | Freq: Four times a day (QID) | RECTAL | Status: DC | PRN
Start: 2015-02-03 — End: 2015-02-03

## 2015-02-03 MED ORDER — BUPIVACAINE-EPINEPHRINE 0.5% -1:200000 IJ SOLN
INTRAMUSCULAR | Status: DC | PRN
  Administered 2015-02-03: 20 mL

## 2015-02-03 MED ORDER — EPHEDRINE SULFATE 50 MG/ML IJ SOLN
INTRAMUSCULAR | Status: AC
  Filled 2015-02-03: qty 1

## 2015-02-03 MED ORDER — ONDANSETRON HCL 4 MG/2ML IJ SOLN
INTRAMUSCULAR | Status: DC | PRN
  Administered 2015-02-03: 4 mg via INTRAVENOUS

## 2015-02-03 MED ORDER — 0.9 % SODIUM CHLORIDE (POUR BTL) OPTIME
TOPICAL | Status: DC | PRN
  Administered 2015-02-03: 1000 mL

## 2015-02-03 MED ORDER — HYDROMORPHONE HCL 1 MG/ML IJ SOLN
1.0000 mg | INTRAMUSCULAR | Status: DC | PRN
Start: 2015-02-03 — End: 2015-02-03

## 2015-02-03 SURGICAL SUPPLY — 33 items
APPLIER CLIP ROT 10 11.4 M/L (STAPLE) ×3
BENZOIN TINCTURE PRP APPL 2/3 (GAUZE/BANDAGES/DRESSINGS) ×3 IMPLANT
CABLE HIGH FREQUENCY MONO STRZ (ELECTRODE) ×3 IMPLANT
CHLORAPREP W/TINT 26ML (MISCELLANEOUS) ×3 IMPLANT
CLIP APPLIE ROT 10 11.4 M/L (STAPLE) ×1 IMPLANT
CLOSURE WOUND 1/2 X4 (GAUZE/BANDAGES/DRESSINGS) ×1
COVER MAYO STAND STRL (DRAPES) ×3 IMPLANT
COVER SURGICAL LIGHT HANDLE (MISCELLANEOUS) ×3 IMPLANT
DECANTER SPIKE VIAL GLASS SM (MISCELLANEOUS) ×3 IMPLANT
DRAPE C-ARM 42X120 X-RAY (DRAPES) ×3 IMPLANT
DRAPE LAPAROSCOPIC ABDOMINAL (DRAPES) IMPLANT
ELECT REM PT RETURN 9FT ADLT (ELECTROSURGICAL) ×3
ELECTRODE REM PT RTRN 9FT ADLT (ELECTROSURGICAL) ×1 IMPLANT
GAUZE SPONGE 2X2 8PLY STRL LF (GAUZE/BANDAGES/DRESSINGS) ×1 IMPLANT
GLOVE SURG ORTHO 8.0 STRL STRW (GLOVE) ×3 IMPLANT
GOWN STRL REUS W/TWL XL LVL3 (GOWN DISPOSABLE) ×6 IMPLANT
HEMOSTAT SURGICEL 4X8 (HEMOSTASIS) IMPLANT
KIT BASIN OR (CUSTOM PROCEDURE TRAY) ×3 IMPLANT
POUCH SPECIMEN RETRIEVAL 10MM (ENDOMECHANICALS) ×3 IMPLANT
SCISSORS LAP 5X35 DISP (ENDOMECHANICALS) ×3 IMPLANT
SET CHOLANGIOGRAPH MIX (MISCELLANEOUS) ×3 IMPLANT
SET IRRIG TUBING LAPAROSCOPIC (IRRIGATION / IRRIGATOR) ×3 IMPLANT
SLEEVE XCEL OPT CAN 5 100 (ENDOMECHANICALS) ×3 IMPLANT
SPONGE GAUZE 2X2 STER 10/PKG (GAUZE/BANDAGES/DRESSINGS) ×2
STRIP CLOSURE SKIN 1/2X4 (GAUZE/BANDAGES/DRESSINGS) ×2 IMPLANT
SUT MNCRL AB 4-0 PS2 18 (SUTURE) ×3 IMPLANT
TAPE CLOTH SURG 4X10 WHT LF (GAUZE/BANDAGES/DRESSINGS) ×3 IMPLANT
TOWEL OR 17X26 10 PK STRL BLUE (TOWEL DISPOSABLE) ×3 IMPLANT
TRAY LAPAROSCOPIC (CUSTOM PROCEDURE TRAY) ×3 IMPLANT
TROCAR BLADELESS OPT 5 100 (ENDOMECHANICALS) ×3 IMPLANT
TROCAR XCEL BLUNT TIP 100MML (ENDOMECHANICALS) ×3 IMPLANT
TROCAR XCEL NON-BLD 11X100MML (ENDOMECHANICALS) ×3 IMPLANT
TUBING INSUF HEATED (TUBING) ×3 IMPLANT

## 2015-02-03 NOTE — Interval H&P Note (Signed)
History and Physical Interval Note:  02/03/2015 7:11 AM  Beth Rivera  has presented today for surgery, with the diagnosis of Chronic cholecystitis ,cholelithiasis.  The various methods of treatment have been discussed with the patient and family. After consideration of risks, benefits and other options for treatment, the patient has consented to    Procedure(s): LAPAROSCOPIC CHOLECYSTECTOMY WITH INTRAOPERATIVE CHOLANGIOGRAM (N/A) as a surgical intervention .    The patient's history has been reviewed, patient examined, no change in status, stable for surgery.  I have reviewed the patient's chart and labs.  Questions were answered to the patient's satisfaction.    Velora Heckler, MD, [facility] Office: [phone]    Amori Colomb Judie Petit

## 2015-02-03 NOTE — Anesthesia Postprocedure Evaluation (Signed)
Anesthesia Post Note  Patient: Beth Rivera  Procedure(s) Performed: Procedure(s) (LRB): LAPAROSCOPIC CHOLECYSTECTOMY WITH INTRAOPERATIVE CHOLANGIOGRAM (N/A)  Patient location during evaluation: PACU Anesthesia Type: General Level of consciousness: awake and alert Pain management: pain level controlled Vital Signs Assessment: post-procedure vital signs reviewed and stable Respiratory status: spontaneous breathing, nonlabored ventilation, respiratory function stable and patient connected to nasal cannula oxygen Cardiovascular status: blood pressure returned to baseline and stable Postop Assessment: no signs of nausea or vomiting Anesthetic complications: no    Last Vitals:  Filed Vitals:   02/03/15 1100 02/03/15 1205  BP: 124/80 135/82  Pulse: 65 66  Temp: 36.5 C 36.8 C  Resp: 15 15    Last Pain:  Filed Vitals:   02/03/15 1227  PainSc: 0-No pain                 Reino Kent

## 2015-02-03 NOTE — Op Note (Signed)
Procedure Note  Pre-operative Diagnosis:  Chronic cholecystitis, cholelithiasis  Post-operative Diagnosis:  same  Surgeon:  Velora Heckler, MD, FACS  Assistant:  none   Procedure:  Laparoscopic cholecystectomy with intra-operative cholangiography  Anesthesia:  General  Estimated Blood Loss:  minimal  Drains: none         Specimen: Gallbladder to pathology  Indications:  Patient is referred by Dr. Silverio Lay in the emergency department for treatment of symptomatic cholelithiasis. Patient had developed intermittent right upper quadrant abdominal pain radiating to the back in November 2016. Some of the episodes have lasted as long as 2 hours and been associated with nausea and vomiting. Patient denies fevers or chills. She denies jaundice or acholic stools. She has had no prior abdominal surgery. There is a family history of cholecystectomy in the patient's father. Patient underwent ultrasound on January 03, 2015. This showed multiple gallstones, the largest of which measured 3.1 cm. There was no biliary dilatation and no sign of acute cholecystitis. Patient now presents for consideration of laparoscopic cholecystectomy.  Procedure Details:  The patient was seen in the pre-op holding area. The risks, benefits, complications, treatment options, and expected outcomes were previously discussed with the patient. The patient agreed with the proposed plan and has signed the informed consent form.  The patient was brought to the Operating Room, identified as Graycie Halley and the procedure verified as laparoscopic cholecystectomy with intraoperative cholangiography. A "time out" was completed and the above information confirmed.  The patient was placed in the supine position. Following induction of general anesthesia, the abdomen was prepped and draped in the usual aseptic fashion.  An incision was made in the skin near the umbilicus. The midline fascia was incised and the peritoneal cavity  was entered and a Hasson canula was introduced under direct vision.  The Hasson canula was secured with a 0-Vicryl pursestring suture. Pneumoperitoneum was established with carbon dioxide. Additional trocars were introduced under direct vision along the right costal margin in the midline, mid-clavicular line, and anterior axillary line.   The gallbladder was identified and the fundus grasped and retracted cephalad. The gallbladder was largely intrahepatic.  Adhesions were taken down bluntly and the electrocautery was utilized as needed, taking care not to injure any adjacent structures. The infundibulum was grasped and retracted laterally, exposing the peritoneum overlying the triangle of Calot. The peritoneum was incised and structures exposed with blunt dissection. The cystic duct was clearly identified, bluntly dissected circumferentially, and clipped at the neck of the gallbladder.  An incision was made in the cystic duct and the cholangiogram catheter introduced. The catheter was secured using an ligaclip.  Real-time cholangiography was performed using C-arm fluoroscopy.  There was rapid filling of a normal caliber common bile duct.  There was reflux of contrast into the left and right hepatic ductal systems.  There was free flow distally into the duodenum without filling defect or obstruction.  The catheter was removed from the peritoneal cavity.  The cystic duct was then ligated with surgical clips and divided. The cystic artery was identified, dissected circumferentially, ligated with ligaclips, and divided.  The gallbladder was dissected away from the liver bed using the electrocautery for hemostasis. The gallbladder was completely removed from the liver and placed into an endocatch bag. The gallbladder was removed in the endocatch bag through the umbilical port site and submitted to pathology for review.  The right upper quadrant was irrigated and the gallbladder bed was inspected. Hemostasis was  achieved with the electrocautery.  Pneumoperitoneum was released after viewing removal of the trocars with good hemostasis noted. The umbilical wound was irrigated and the fascia was then closed with the pursestring suture.  Local anesthetic was infiltrated at all port sites. The skin incisions were closed with 4-0 Monocril subcuticular sutures and steri-strips and dressings were applied.  Instrument, sponge, and needle counts were correct at the conclusion of the case.  The patient was awakened from anesthesia and brought to the recovery room in stable condition.  The patient tolerated the procedure well.   Velora Heckler, MD, [facility] Office: [phone]

## 2015-02-03 NOTE — Transfer of Care (Signed)
Immediate Anesthesia Transfer of Care Note  Patient: Beth Rivera  Procedure(s) Performed: Procedure(s): LAPAROSCOPIC CHOLECYSTECTOMY WITH INTRAOPERATIVE CHOLANGIOGRAM (N/A)  Patient Location: PACU  Anesthesia Type:General  Level of Consciousness: awake, alert  and oriented  Airway & Oxygen Therapy: Patient Spontanous Breathing and Patient connected to face mask oxygen  Post-op Assessment: Report given to RN  Post vital signs: Reviewed  Last Vitals:  Filed Vitals:   02/03/15 0548  BP: 127/74  Pulse: 96  Temp: 36.8 C  Resp: 18    Complications: No apparent anesthesia complications

## 2015-02-03 NOTE — Anesthesia Procedure Notes (Signed)
Procedure Name: Intubation Date/Time: 02/03/2015 7:28 AM Performed by: Jhonnie Garner Pre-anesthesia Checklist: Patient identified, Emergency Drugs available, Suction available and Patient being monitored Patient Re-evaluated:Patient Re-evaluated prior to inductionOxygen Delivery Method: Circle system utilized Preoxygenation: Pre-oxygenation with 100% oxygen Intubation Type: IV induction Ventilation: Mask ventilation without difficulty Laryngoscope Size: Miller and 2 Grade View: Grade I Tube type: Oral Tube size: 7.0 mm Number of attempts: 1 Airway Equipment and Method: Stylet Placement Confirmation: ETT inserted through vocal cords under direct vision,  positive ETCO2 and breath sounds checked- equal and bilateral Secured at: 21 cm Tube secured with: Tape Dental Injury: Teeth and Oropharynx as per pre-operative assessment

## 2015-02-03 NOTE — Discharge Summary (Signed)
  Physician Discharge Summary [facility]  Patient ID: Beth Rivera MRN: [id number] DOB/AGE: 1970/03/08 45 y.o.  Admit date: 02/03/2015 Discharge date: 02/03/2015  Admission Diagnoses:  Chronic cholecystitis, cholelithiasis  Discharge Diagnoses:  Principal Problem:   Cholelithiasis with chronic cholecystitis   Discharged Condition: good  Hospital Course: patient observed post op until mid afternoon on the day of surgery.  Ambulating, tolerating diet, pain controlled.  Patient desires discharge home.  Consults: None  Treatments: surgery: lap chole with IOC  Discharge Exam: Blood pressure 135/82, pulse 66, temperature 98.3 F (36.8 C), temperature source Oral, resp. rate 15, height  (1.676 m), weight 65.772 kg (145 lb), SpO2 99 %.   Disposition: Home     Medication List    TAKE these medications        HYDROcodone-acetaminophen 5-325 MG tablet  Commonly known as:  NORCO/VICODIN  Take 1 tablet by mouth every 6 (six) hours as needed.     HYDROcodone-acetaminophen 5-325 MG tablet  Commonly known as:  NORCO/VICODIN  Take 1-2 tablets by mouth every 4 (four) hours as needed for moderate pain.      ASK your doctor about these medications        buPROPion 150 MG 24 hr tablet  Commonly known as:  WELLBUTRIN XL  Take 1 tablet (150 mg total) by mouth daily.     calcium carbonate 500 MG chewable tablet  Commonly known as:  TUMS - dosed in mg elemental calcium  Chew 1 tablet by mouth 3 (three) times daily as needed for indigestion or heartburn.     cephALEXin 500 MG capsule  Commonly known as:  KEFLEX  Take 1 capsule (500 mg total) by mouth 3 (three) times daily.     cyclobenzaprine 5 MG tablet  Commonly known as:  FLEXERIL  Take 1 tablet (5 mg total) by mouth 3 (three) times daily as needed for muscle spasms.     famotidine 20 MG tablet  Commonly known as:  PEPCID  Take 20 mg by mouth daily as needed for heartburn or indigestion.     ibuprofen 200 MG tablet  Commonly known as:  ADVIL,MOTRIN  Take 400 mg by mouth every 6 (six) hours as needed for moderate pain.           Follow-up Information    Follow up with Velora Heckler, MD. Schedule an appointment as soon as possible for a visit in 3 weeks.   Specialty:  General Surgery   Why:  For wound re-check   Contact information:   [address] [place] [address] [phone]       Velora Heckler, MD, [facility] Office: [phone]   Signed: Velora Heckler 02/03/2015, 1:55 PM

## 2015-02-03 NOTE — Discharge Instructions (Signed)
Post Anesthesia Home Care Instructions  Activity: Get plenty of rest for the remainder of the day. A responsible adult should stay with you for 24 hours following the procedure.  For the next 24 hours, DO NOT: -Drive a car -Advertising copywriter -Drink alcoholic beverages -Take any medication unless instructed by your physician -Make any legal decisions or sign important papers.  Meals: Start with liquid foods such as gelatin or soup. Progress to regular foods as tolerated. Avoid greasy, spicy, heavy foods. If nausea and/or vomiting occur, drink only clear liquids until the nausea and/or vomiting subsides. Call your physician if vomiting continues.  Special Instructions/Symptoms: Your throat may feel dry or sore from the anesthesia or the breathing tube placed in your throat during surgery. If this causes discomfort, gargle with warm salt water. The discomfort should disappear within 24 hours.  If you had a scopolamine patch placed behind your ear for the management of post- operative nausea and/or vomiting:  1. The medication in the patch is effective for 72 hours, after which it should be removed.  Wrap patch in a tissue and discard in the trash. Wash hands thoroughly with soap and water. 2. You may remove the patch earlier than 72 hours if you experience unpleasant side effects which may include dry mouth, dizziness or visual disturbances. 3. Avoid touching the patch. Wash your hands with soap and water after contact with the patch.    [facility]  LAPAROSCOPIC SURGERY:  POST-OP INSTRUCTIONS  Always review your discharge instruction sheet given to you by the facility where your surgery was performed.  A prescription for pain medication may be given to you upon discharge.  Take your pain medication as prescribed.  If narcotic pain medicine is not needed, then you may take acetaminophen (Tylenol) or ibuprofen (Advil) as needed.  Take your usually prescribed  medications unless otherwise directed.  If you need a refill on your pain medication, please contact your pharmacy.  They will contact our office to request authorization. Prescriptions will not be filled after 5 P.M. or on weekends.  You should follow a light diet the first few days after arrival home, such as soup and crackers or toast.  Be sure to include plenty of fluids daily.  Most patients will experience some swelling and bruising in the area of the incisions.  Ice packs will help.  Swelling and bruising can take several days to resolve.   It is common to experience some constipation after surgery.  Increasing fluid intake and taking a stool softener (such as Colace) will usually help or prevent this problem from occurring.  A mild laxative (Milk of Magnesia or Miralax) should be taken according to package instructions if there has been no bowel movement after 48 hours.  You will have steri-strips and a gauze dressing over your incisions.  You may remove the gauze bandage on the second day after surgery, and you may shower at that time.  Leave your steri-strips (small skin tapes) in place directly over the incision.  These strips should remain on the skin for 5-7 days and then be removed.  You may get them wet in the shower and pat them dry.  Any sutures or staples will be removed at the office during your follow-up visit.  ACTIVITIES:  You may resume regular (light) daily activities beginning the next day - such as daily self-care, walking, climbing stairs - gradually increasing activities as tolerated.  You may have sexual intercourse when it is comfortable.  Refrain from any heavy lifting or straining until approved by your doctor.  You may drive when you are no longer taking prescription pain medication, you can comfortably wear a seatbelt, and you can safely maneuver your car and apply brakes.  You should see your doctor in the office for a follow-up appointment approximately 2-3 weeks  after your surgery.  Make sure that you call for this appointment within a day or two after you arrive home to insure a convenient appointment time.  WHEN TO CALL YOUR DOCTOR: 1. Fever over 101.0 2. Inability to urinate 3. Continued bleeding from incision 4. Increased pain, redness, or drainage from the incision 5. Increasing abdominal pain  The clinic staff is available to answer your questions during regular business hours.  Please dont hesitate to call and ask to speak to one of the nurses for clinical concerns.  If you have a medical emergency, go to the nearest emergency room or call 911.  A surgeon from [facility] is always on call for the hospital.  Velora Heckler, MD, [facility] Office: [phone] Toll Free:  [phone] FAX [phone]  Website: [email or web address]

## 2015-02-03 NOTE — Anesthesia Preprocedure Evaluation (Signed)
Anesthesia Evaluation  Patient identified by MRN, date of birth, ID band Patient awake    Reviewed: Allergy & Precautions, H&P , NPO status , Patient's Chart, lab work & pertinent test results  History of Anesthesia Complications (+) PONV and history of anesthetic complications  Airway Mallampati: II  TM Distance: >3 FB Neck ROM: full    Dental no notable dental hx.    Pulmonary neg pulmonary ROS,    Pulmonary exam normal breath sounds clear to auscultation       Cardiovascular negative cardio ROS Normal cardiovascular exam Rhythm:regular Rate:Normal     Neuro/Psych PSYCHIATRIC DISORDERS Depression negative neurological ROS     GI/Hepatic negative GI ROS, Neg liver ROS,   Endo/Other  negative endocrine ROS  Renal/GU negative Renal ROS     Musculoskeletal   Abdominal   Peds  Hematology negative hematology ROS (+)   Anesthesia Other Findings   Reproductive/Obstetrics                             Anesthesia Physical Anesthesia Plan  ASA: II  Anesthesia Plan: General   Post-op Pain Management:    Induction: Intravenous  Airway Management Planned: Oral ETT  Additional Equipment:   Intra-op Plan:   Post-operative Plan: Extubation in OR  Informed Consent: I have reviewed the patients History and Physical, chart, labs and discussed the procedure including the risks, benefits and alternatives for the proposed anesthesia with the patient or authorized representative who has indicated his/her understanding and acceptance.   Dental Advisory Given  Plan Discussed with: Anesthesiologist, CRNA and Surgeon  Anesthesia Plan Comments: (Plan for PONV prophylaxis with scopolamine patch, decadron, zofran +/- propofol based anesthetic)        Anesthesia Quick Evaluation

## 2015-12-11 ENCOUNTER — Ambulatory Visit (INDEPENDENT_AMBULATORY_CARE_PROVIDER_SITE_OTHER): Payer: BLUE CROSS/BLUE SHIELD | Admitting: Family

## 2015-12-11 ENCOUNTER — Encounter: Payer: Self-pay | Admitting: Family

## 2015-12-11 VITALS — BP 154/92 | HR 84 | Temp 98.9°F | Resp 16 | Ht 66.0 in | Wt 143.0 lb

## 2015-12-11 DIAGNOSIS — F339 Major depressive disorder, recurrent, unspecified: Secondary | ICD-10-CM

## 2015-12-11 DIAGNOSIS — G4710 Hypersomnia, unspecified: Secondary | ICD-10-CM | POA: Insufficient documentation

## 2015-12-11 DIAGNOSIS — F338 Other recurrent depressive disorders: Secondary | ICD-10-CM

## 2015-12-11 DIAGNOSIS — R112 Nausea with vomiting, unspecified: Secondary | ICD-10-CM | POA: Diagnosis not present

## 2015-12-11 DIAGNOSIS — Z23 Encounter for immunization: Secondary | ICD-10-CM

## 2015-12-11 DIAGNOSIS — F458 Other somatoform disorders: Secondary | ICD-10-CM

## 2015-12-11 DIAGNOSIS — R0989 Other specified symptoms and signs involving the circulatory and respiratory systems: Secondary | ICD-10-CM | POA: Insufficient documentation

## 2015-12-11 MED ORDER — ONDANSETRON 4 MG PO TBDP: 4.0000 mg | ORAL_TABLET | Freq: Three times a day (TID) | ORAL | 0 refills | Status: DC | PRN

## 2015-12-11 MED ORDER — BUPROPION HCL ER (XL) 150 MG PO TB24: 150.0000 mg | ORAL_TABLET | Freq: Every day | ORAL | 0 refills | Status: DC

## 2015-12-11 MED ORDER — CYCLOBENZAPRINE HCL 10 MG PO TABS: 10.0000 mg | ORAL_TABLET | Freq: Three times a day (TID) | ORAL | 0 refills | Status: DC | PRN

## 2015-12-11 NOTE — Progress Notes (Signed)
Subjective:    Patient ID: Beth Rivera, female    DOB: 10/28/1970, 45 y.o.   MRN: [id number]  Chief Complaint  Patient presents with  . Establish Care    referral to sleep specialist, antidepression medication, flexeril for globus pharyngeus, zofran for vomitting spells, dexa scan and hormone levels checked    HPI:  Beth Rivera is a 45 y.o. female who  has a past medical history of Chronic cholecystitis; Complication of anesthesia; Cough (LAST 3 WEEKS); Depression; Family history of adverse reaction to anesthesia; Neck pain; PMS (premenstrual syndrome); PONV (postoperative nausea and vomiting); Poor sleep pattern; Psoriasis; Shoulder pain; and Tinnitus. and presents today for an office visit to establish care.    1.) Sleep issues - Associated symptoms of hypersomnia. Previously tested at home with borderline diagnosis of sleep apnea. Recommended for her to complete lab study but was unable to do so at the time. Expresses that she has hypersomina and notes that she takes a nap and will go to bed early. Averaging about 8 hours of sleep at night and 1-2 hours during the day. Does have some fatigue and tiredness even after sleep. Does snore at night and there have been periods of suspected apnea. Has a strong family history with both parents having sleep apnea.   2.) Depression - Associated symptom of depression that occurs in the winter. Denies any suicidal ideations. Previously maintained on Zoloft which did help for a period of time and then changed to bupropion. Describes a lot of emotion and having a short fuse. There is occasional heightened anxiety even with daily tasks that does not occur in the spring or summer.  3.) Gall bladder - Previously had a cholecystectomy and would have nausea on a daily basis which is now down to 1-2 times per week with only occasional vomiting. This has gradually improved over time from August to present. Maintained on Zofran as needed which did help  significantly with her symptoms.    Allergies  Allergen Reactions  . Other     Seasonal, dogs      Outpatient Medications Prior to Visit  Medication Sig Dispense Refill  . buPROPion (WELLBUTRIN XL) 150 MG 24 hr tablet Take 1 tablet (150 mg total) by mouth daily. (Patient not taking: Reported on 01/03/2015) 30 tablet 0  . calcium carbonate (TUMS - DOSED IN MG ELEMENTAL CALCIUM) 500 MG chewable tablet Chew 1 tablet by mouth 3 (three) times daily as needed for indigestion or heartburn.    . cephALEXin (KEFLEX) 500 MG capsule Take 1 capsule (500 mg total) by mouth 3 (three) times daily. (Patient not taking: Reported on 01/31/2015) 21 capsule 0  . cyclobenzaprine (FLEXERIL) 5 MG tablet Take 1 tablet (5 mg total) by mouth 3 (three) times daily as needed for muscle spasms. (Patient not taking: Reported on 01/03/2015) 30 tablet 0  . famotidine (PEPCID) 20 MG tablet Take 20 mg by mouth daily as needed for heartburn or indigestion.    Marland Kitchen. HYDROcodone-acetaminophen (NORCO/VICODIN) 5-325 MG tablet Take 1 tablet by mouth every 6 (six) hours as needed. (Patient taking differently: Take 1 tablet by mouth every 6 (six) hours as needed for moderate pain. ) 8 tablet 0  . HYDROcodone-acetaminophen (NORCO/VICODIN) 5-325 MG tablet Take 1-2 tablets by mouth every 4 (four) hours as needed for moderate pain. 20 tablet 0  . ibuprofen (ADVIL,MOTRIN) 200 MG tablet Take 400 mg by mouth every 6 (six) hours as needed for moderate pain.     No facility-administered  medications prior to visit.      Past Medical History:  Diagnosis Date  . Chronic cholecystitis   . Complication of anesthesia   . Cough LAST 3 WEEKS   CLEAR SPUTUM, NO FEVER   . Depression   . Family history of adverse reaction to anesthesia    SON HAS PONV  . Neck pain   . PMS (premenstrual syndrome)   . PONV (postoperative nausea and vomiting)   . Poor sleep pattern   . Psoriasis    GUTATE, MOSTLY ON LIMBS  . Shoulder pain   . Tinnitus    BOTH  EARS      Past Surgical History:  Procedure Laterality Date  . CHOLECYSTECTOMY N/A 02/03/2015   Procedure: LAPAROSCOPIC CHOLECYSTECTOMY WITH INTRAOPERATIVE CHOLANGIOGRAM;  Surgeon: Darnell Level, MD;  Location: [facility];  Service: General;  Laterality: N/A;  . WISDOM TOOTH EXTRACTION        Family History  Problem Relation Age of Onset  . Heart disease Paternal Grandfather   . Diabetes Paternal Grandfather   . Heart disease Paternal Grandmother   . Diabetes Paternal Grandmother   . Hernia Father   . Diabetes Father   . Other Father     joint problems  . Thyroid disease Mother   . Other Mother     blood transfusion  . Hepatitis C Mother     contracted from blood transfusion  . Thyroid disease Maternal Aunt   . Thyroid disease Maternal Aunt   . Other Paternal Aunt     joint problems      Social History   Social History  . Marital status: Married    Spouse name: N/A  . Number of children: 2  . Years of education: 16   Occupational History  . Not on file.   Social History Main Topics  . Smoking status: Never Smoker  . Smokeless tobacco: Never Used  . Alcohol use 1.0 - 2.0 oz/week    2 - 4 Standard drinks or equivalent per week     Comment: OCCASIONAL  . Drug use: No  . Sexual activity: Yes    Partners: Male    Birth control/ protection: Surgical     Comment: husband vasectomy    Other Topics Concern  . Not on file   Social History Narrative   Fun: Biking, hiking and gardening.   Denies abuse and feels safe at home.       Review of Systems  Constitutional: Negative for chills and fever.  HENT: Negative for congestion, sore throat and trouble swallowing.   Respiratory: Negative for chest tightness and shortness of breath.   Cardiovascular: Negative for chest pain, palpitations and leg swelling.  Gastrointestinal: Positive for nausea and vomiting. Negative for abdominal pain, constipation and diarrhea.  Psychiatric/Behavioral: Positive for dysphoric  mood and sleep disturbance. Negative for self-injury and suicidal ideas.       Objective:    BP (!) 154/92 (BP Location: Left Arm, Patient Position: Sitting, Cuff Size: Normal)   Pulse 84   Temp 98.9 F (37.2 C) (Oral)   Resp 16   Ht 5\' 6"  (1.676 m)   Wt 143 lb (64.9 kg)   SpO2 97%   BMI 23.08 kg/m  Nursing note and vital signs reviewed.  Physical Exam  Constitutional: She is oriented to person, place, and time. She appears well-developed and well-nourished. No distress.  Cardiovascular: Normal rate, regular rhythm, normal heart sounds and intact distal pulses.   Pulmonary/Chest: Effort normal  and breath sounds normal.  Abdominal: Normal appearance and bowel sounds are normal. She exhibits no mass. There is no hepatosplenomegaly. There is no tenderness. There is no rigidity, no rebound, no guarding, no tenderness at McBurney's point and negative Murphy's sign.  Neurological: She is alert and oriented to person, place, and time.  Skin: Skin is warm and dry.  Psychiatric: She has a normal mood and affect. Her behavior is normal. Judgment and thought content normal.       Assessment & Plan:   Problem List Items Addressed This Visit      Digestive   Non-intractable vomiting with nausea    Occasional nausea and vomiting of unexplained origin follow cholecystectomy appears to be slowly improving with symptom management with ondansetron as needed. Refill ondansetron. Continue to monitor.       Relevant Medications   ondansetron (ZOFRAN-ODT) 4 MG disintegrating tablet     Other   Seasonal affective disorder (HCC) - Primary    Symptoms are consistent with seasonal affective disorder that generally occurs between November and March. Start bupropion. Recommend counseling in combination with medication, however patient declines at this time. Continue to monitor.       Relevant Medications   buPROPion (WELLBUTRIN XL) 150 MG 24 hr tablet   Hypersomnia    Hypersomnia with concern  with possible sleep apnea based on history and previous at home testing with the recommendation of a follow up sleep study for confirmation. Does have significant family history. Refer to sleep medicine for completion of sleep study.       Relevant Orders   Ambulatory referral to Neurology   Globus pharyngeus    Feelings of globus pharygeus that occurs infrequently and improved with cyclobenzaprine. Refill cyclobenzaprine. If symptoms continue consider referral to gastroenterology for possible endoscopy.      Relevant Medications   cyclobenzaprine (FLEXERIL) 10 MG tablet    Other Visit Diagnoses    Need for Tdap vaccination       Relevant Orders   Tdap vaccine greater than or equal to 7yo IM (Completed)   Encounter for immunization       Relevant Orders   Tdap vaccine greater than or equal to 7yo IM (Completed)   Flu Vaccine QUAD 36+ mos IM (Completed)       I have discontinued Ms. Neisler's cyclobenzaprine, ibuprofen, famotidine, cephALEXin, HYDROcodone-acetaminophen, calcium carbonate, and HYDROcodone-acetaminophen. I am also having her start on ondansetron and cyclobenzaprine. Additionally, I am having her maintain her buPROPion.   Meds ordered this encounter  Medications  . buPROPion (WELLBUTRIN XL) 150 MG 24 hr tablet    Sig: Take 1 tablet (150 mg total) by mouth daily.    Dispense:  30 tablet    Refill:  0    Order Specific Question:   Supervising Provider    Answer:   Hillard Danker A [[id number]]  . ondansetron (ZOFRAN-ODT) 4 MG disintegrating tablet    Sig: Take 1 tablet (4 mg total) by mouth every 8 (eight) hours as needed for nausea or vomiting.    Dispense:  20 tablet    Refill:  0    Order Specific Question:   Supervising Provider    Answer:   Hillard Danker A [[id number]]  . cyclobenzaprine (FLEXERIL) 10 MG tablet    Sig: Take 1 tablet (10 mg total) by mouth 3 (three) times daily as needed for muscle spasms.    Dispense:  30 tablet    Refill:  0    Order  Specific Question:   Supervising Provider    Answer:   Hillard Danker A [[id number]]     Follow-up: Return in about 1 month (around 01/11/2016), or if symptoms worsen or fail to improve.  Jeanine Luz, FNP

## 2015-12-11 NOTE — Assessment & Plan Note (Signed)
Occasional nausea and vomiting of unexplained origin follow cholecystectomy appears to be slowly improving with symptom management with ondansetron as needed. Refill ondansetron. Continue to monitor.

## 2015-12-11 NOTE — Assessment & Plan Note (Signed)
Hypersomnia with concern with possible sleep apnea based on history and previous at home testing with the recommendation of a follow up sleep study for confirmation. Does have significant family history. Refer to sleep medicine for completion of sleep study.

## 2015-12-11 NOTE — Assessment & Plan Note (Signed)
Symptoms are consistent with seasonal affective disorder that generally occurs between November and March. Start bupropion. Recommend counseling in combination with medication, however patient declines at this time. Continue to monitor.

## 2015-12-11 NOTE — Assessment & Plan Note (Signed)
Feelings of globus pharygeus that occurs infrequently and improved with cyclobenzaprine. Refill cyclobenzaprine. If symptoms continue consider referral to gastroenterology for possible endoscopy.

## 2015-12-11 NOTE — Patient Instructions (Addendum)
Thank you for choosing [facility].  SUMMARY AND INSTRUCTIONS:  Medication:  Please start the Wellbutrin daily.  Take the Zofran as needed for nausea.  Cyclobezaprine as needed for globus sensation.  Your prescription(s) have been submitted to your pharmacy or been printed and provided for you. Please take as directed and contact our office if you believe you are having problem(s) with the medication(s) or have any questions.  Referrals:  Our Patient Care Coordinators will call with your appointment with sleep medicine.  Referrals have been made during this visit. You should expect to hear back from our schedulers in about 7-10 days in regards to establishing an appointment with the specialists we discussed.   Follow up:  If your symptoms worsen or fail to improve, please contact our office for further instruction, or in case of emergency go directly to the emergency room at the closest medical facility.    Seasonal Affective Disorder Introduction Seasonal affective disorder (SAD) is a form of depression. It is when you feel sad, down, or blue at specific times of the year. The most common time of year for this is late fall and winter, when the days are shorter and most people spend less time outdoors. This is why SAD is also known as the "winter blues." SAD occurs less commonly in the spring or summer. SAD can vary in severity and can interfere with work, school, relationships, and normal daily activities. What increases the risk? This condition is more common in:  Young women.  People who have a history of clinical depression or bipolar disorder.  People who live far [place] or far [place] of the equator. What are the signs or symptoms? Symptoms of this condition include:  Depressed mood, such as:  Feeling sad, down, blue, or teary.  Having crying spells.  Irritability.  Trouble sleeping or sleeping more than usual.  Loss of interest in activities that you  usually enjoy.  Feelings of guilt or worthlessness.  Restlessness or loss of energy.  Difficulty concentrating, remembering, or making decisions.  Significant change in appetite or weight.  Recurrent wishes for death, recurrent thoughts of self-harm, or an attempt at suicide. How is this diagnosed? Diagnosis of this condition is usually made through an assessment by your health care provider. You will be asked about your moods, thoughts, and behaviors. You will also be asked about your medical history, any major life changes, medicines, and substance use. A physical exam and lab work may be done to make sure there is no other cause for your depression. You may be referred to a mental health specialist for further evaluation. How is this treated? Treatment for this condition may include:  Light therapy. Light therapy involves sitting in front of a light source for 15-30 minutes every day. It is thought to work by increasing the duration of daylight that is sensed by the brain.  Antidepressant medicine.  Cognitive behavioral therapy (CBT).CBT is a form of talk therapy that helps to identify and change negative thoughts that are associated with SAD. Follow these instructions at home:  Take over-the-counter and prescription medicines only as told by your healthcare provider. This is important.  Check with your health care provider before you start taking any new prescriptions, over-the-counter medicines, herbs, or supplements.  Keep all follow-up visits as told by your health care provider.This is important.  Maintain a healthy lifestyle.  Eat healthy foods.  Get plenty of sleep.  Exercise regularly.  Do not drink alcohol.  Do not  use recreational drugs.  Make your home and work environment as sunny or bright as possible. Open blinds and spend as much time as possible outside. Contact a health care provider if:  Your medicines do not seem to be helping.  Your symptoms do  not improve or they get worse.  You have trouble taking care of yourself.  You experience side effects of medicines, such as changes in sleep patterns, dizziness, drowsiness, weight gain, restlessness, movement changes, or tremors. Get help right away if:  You have serious thoughts about hurting yourself or others.  You experience serious side effects of medicine, such as:  Swelling of your face, lips, tongue, or throat.  Fever, confusion, muscle spasms, or seizures. This information is not intended to replace advice given to you by your health care provider. Make sure you discuss any questions you have with your health care provider. Document Released: 09/15/2000 Document Revised: 05/29/2015 Document Reviewed: 12/25/2013  2017 Elsevier

## 2016-01-20 ENCOUNTER — Encounter: Payer: Self-pay | Admitting: Neurology

## 2016-01-20 ENCOUNTER — Ambulatory Visit (INDEPENDENT_AMBULATORY_CARE_PROVIDER_SITE_OTHER): Payer: BLUE CROSS/BLUE SHIELD | Admitting: Neurology

## 2016-01-20 VITALS — BP 122/80 | HR 78 | Resp 16 | Ht 66.0 in | Wt 143.0 lb

## 2016-01-20 DIAGNOSIS — G4710 Hypersomnia, unspecified: Secondary | ICD-10-CM | POA: Diagnosis not present

## 2016-01-20 DIAGNOSIS — G4733 Obstructive sleep apnea (adult) (pediatric): Secondary | ICD-10-CM

## 2016-01-20 DIAGNOSIS — G4730 Sleep apnea, unspecified: Secondary | ICD-10-CM

## 2016-01-20 NOTE — Patient Instructions (Signed)

## 2016-01-20 NOTE — Progress Notes (Signed)
Subjective:    Patient ID: Beth Rivera is a 46 y.o. female.  HPI     Beth Foley, MD, PhD [facility] [address] [address] [place], [place] [address]  HPI:   Dear Beth Rivera,   I saw your patient, Beth Rivera, upon your kind request in my neurologic clinic today for initial consultation of her sleep disorder, in particular, concern for underlying obstructive sleep apnea. The patient is unaccompanied today. As you know, Beth Rivera is a 46 year old right-handed woman with an underlying medical history of depression, neck pain, PMS, and psoriasis, who reports a prior diagnosis of obstructive sleep apnea. She had a home sleep test in January 2016 which showed mild to borderline findings with an RDI of 5.4, minimum desaturation to 82%, time below 89% saturation of 12 minutes. I reviewed your office note from 12/11/2015. She reports a family history of obstructive sleep apnea in both parents.  Her Epworth sleepiness score is 11 out of 24 today, her fatigue score is 47 out of 63. She lives at home with her husband and 2 children as well as a cousin. She does not currently work. She is a nonsmoker, drinks alcohol occasionally, about once a month and caffeine in the form of coffee, one cup per day and green tea one serving per day, does not currently drink any sodas. Used to work as Runner, broadcasting/film/video. She denies cataplexy, AH HAs, nocturia, RLS symptoms, sleep paralysis, or hypnagogic or hypnopompic hallucinations, does report that she has heard music at night at times which she knew was not real. No PLMs reported. She has a 45 yo and 45 yo.  Her bedtime is between 10 and 11 PM, wakeup time around 6 AM or later 6:45 AM. She likes to take a nap which usually is 1 or 2 hours long, typically around 1 PM or 1:30 PM. She had gallbladder surgery a year ago, has intermittent issues with neck pain for which she has tried Flexeril in the past, has recently received a  prescription for this but has not taken it yet. They have a dog in the bedroom, usually sleeps in the closet, does not bother her at night. She would be willing to consider treatment for her obstructive sleep apnea. She delayed treatment a couple of years ago due to insurance restrictions.  Her Past Medical History Is Significant For: Past Medical History:  Diagnosis Date  . Chronic cholecystitis   . Complication of anesthesia   . Cough LAST 3 WEEKS   CLEAR SPUTUM, NO FEVER   . Depression   . Family history of adverse reaction to anesthesia    SON HAS PONV  . Neck pain   . PMS (premenstrual syndrome)   . PONV (postoperative nausea and vomiting)   . Poor sleep pattern   . Psoriasis    GUTATE, MOSTLY ON LIMBS  . Shoulder pain   . Tinnitus    BOTH EARS    Her Past Surgical History Is Significant For: Past Surgical History:  Procedure Laterality Date  . CHOLECYSTECTOMY N/A 02/03/2015   Procedure: LAPAROSCOPIC CHOLECYSTECTOMY WITH INTRAOPERATIVE CHOLANGIOGRAM;  Surgeon: Darnell Level, MD;  Location: [facility];  Service: General;  Laterality: N/A;  . WISDOM TOOTH EXTRACTION      Her Family History Is Significant For: Family History  Problem Relation Age of Onset  . Heart disease Paternal Grandfather   . Diabetes Paternal Grandfather   . Heart disease Paternal Grandmother   . Diabetes Paternal Grandmother   .  Hernia Father   . Diabetes Father   . Other Father     joint problems  . Thyroid disease Mother   . Other Mother     blood transfusion  . Hepatitis C Mother     contracted from blood transfusion  . Thyroid disease Maternal Aunt   . Thyroid disease Maternal Aunt   . Other Paternal Aunt     joint problems    Her Social History Is Significant For: Social History   Social History  . Marital status: Married    Spouse name: N/A  . Number of children: 2  . Years of education: BA   Social History Main Topics  . Smoking status: Never Smoker  . Smokeless tobacco: Never  Used  . Alcohol use 1.0 - 2.0 oz/week    2 - 4 Standard drinks or equivalent per week     Comment: OCCASIONAL  . Drug use: No  . Sexual activity: Yes    Partners: Male    Birth control/ protection: Surgical     Comment: husband vasectomy    Other Topics Concern  . None   Social History Narrative   Fun: Biking, hiking and gardening.   Denies abuse and feels safe at home.    Drinks 1 cup of coffee and green tea a day     Her Allergies Are:  Allergies  Allergen Reactions  . Other     Seasonal, dogs  :   Her Current Medications Are:  Outpatient Encounter Prescriptions as of 01/20/2016  Medication Sig  . buPROPion (WELLBUTRIN XL) 150 MG 24 hr tablet Take 1 tablet (150 mg total) by mouth daily.  . cyclobenzaprine (FLEXERIL) 10 MG tablet Take 1 tablet (10 mg total) by mouth 3 (three) times daily as needed for muscle spasms.  . ondansetron (ZOFRAN-ODT) 4 MG disintegrating tablet Take 1 tablet (4 mg total) by mouth every 8 (eight) hours as needed for nausea or vomiting. (Patient not taking: Reported on 01/20/2016)   No facility-administered encounter medications on file as of 01/20/2016.   :  Review of Systems:  Out of a complete 14 point review of systems, all are reviewed and negative with the exception of these symptoms as listed below:  Review of Systems  Constitutional: Positive for fatigue.  HENT: Positive for tinnitus.   Neurological:       Patient had sleep study a couple of years ago, before she could f/u for titration study, the company went out of business.   Patient has trouble staying asleep, snores, witnessed apnea, wakes up feeling tired, daytime fatigue, headaches, takes naps.    Epworth Sleepiness Scale 0= would never doze 1= slight chance of dozing 2= moderate chance of dozing 3= high chance of dozing  Sitting and reading:1 Watching TV:0 Sitting inactive in a public place (ex. Theater or meeting):1 As a passenger in a car for an hour without a  break:3 Lying down to rest in the afternoon:3 Sitting and talking to someone:0 Sitting quietly after lunch (no alcohol):3 In a car, while stopped in traffic:0 Total:11  Objective:  Neurologic Exam  Physical Exam Physical Examination:   Vitals:   01/20/16 1007  BP: 122/80  Pulse: 78  Resp: 16   Physical Examination:   Vitals:   01/20/16 1007  BP: 122/80  Pulse: 78  Resp: 16   General Examination: The patient is a very pleasant 46 y.o. female in no acute distress. She appears well-developed and well-nourished and well groomed.  HEENT: Normocephalic, atraumatic, pupils are equal, round and reactive to light and accommodation. Funduscopic exam is normal with sharp disc margins noted. Extraocular tracking is good without limitation to gaze excursion or nystagmus noted. Normal smooth pursuit is noted. Hearing is grossly intact. Tympanic membranes are clear bilaterally. Face is symmetric with normal facial animation and normal facial sensation. Speech is clear with no dysarthria noted. There is no hypophonia. There is no lip, neck/head, jaw or voice tremor. Neck is supple with full range of passive and active motion. There are no carotid bruits on auscultation. Oropharynx exam reveals: mild mouth dryness, good dental hygiene and moderate airway crowding, due to larger uvula and smaller airway opening. Mallampati is class II. Tongue protrudes centrally and palate elevates symmetrically. Tonsils are 1+ in size. Neck size is 12.5 inches. She has a Mild overbite. Nasal inspection reveals no significant nasal mucosal bogginess or redness and no septal deviation.   Chest: Clear to auscultation without wheezing, rhonchi or crackles noted.  Heart: S1+S2+0, regular and normal without murmurs, rubs or gallops noted.   Abdomen: Soft, non-tender and non-distended with normal bowel sounds appreciated on auscultation.  Extremities: There is no pitting edema in the distal lower extremities  bilaterally. Pedal pulses are intact.  Skin: Warm and dry without trophic changes noted.   Musculoskeletal: exam reveals no obvious joint deformities, tenderness or joint swelling or erythema.   Neurologically:  Mental status: The patient is awake, alert and oriented in all 4 spheres. Her immediate and remote memory, attention, language skills and fund of knowledge are appropriate. There is no evidence of aphasia, agnosia, apraxia or anomia. Speech is clear with normal prosody and enunciation. Thought process is linear. Mood is normal and affect is normal.  Cranial nerves II - XII are as described above under HEENT exam. In addition: shoulder shrug is normal with equal shoulder height noted.  Motor exam: Normal bulk, strength and tone is noted. There is no drift, tremor or rebound. Romberg is negative. Reflexes are 2+ throughout. Babinski: Toes are flexor bilaterally. Fine motor skills and coordination: intact with normal finger taps, normal hand movements, normal rapid alternating patting, normal foot taps and normal foot agility.  Cerebellar testing: No dysmetria or intention tremor on finger to nose testing. Heel to shin is unremarkable bilaterally. There is no truncal or gait ataxia.  Sensory exam: intact to light touch, vibration, temperature sense in the upper and lower extremities.  Gait, station and balance: She stands easily. No veering to one side is noted. No leaning to one side is noted. Posture is age-appropriate and stance is narrow based. Gait shows normal stride length and normal pace. No problems turning are noted. Tandem walk is unremarkable. Intact toe and heel stance is noted.               Assessment and Plan:   In summary, Sheylin Scharnhorst is a very pleasant 46 y.o.-year old female with an underlying medical history of depression, neck pain, PMS, and psoriasis, whose istory and physical exam as well as prior home sleep test results are in keeping with obstructive sleep apnea.  Of note, she has a family history of obstructive sleep apnea in both parents and she reports that her mother is not obese and still has to use a CPAP machine. She reports daytime somnolence and the need for a daily nap but she does not endorse sleep attacks or severe uncontrolled hypersomnolence, arguing against narcolepsy.  I had a long chat with the patient  about my findings and the diagnosis of OSA, its prognosis and treatment options. We talked about medical treatments, surgical interventions and non-pharmacological approaches. I explained in particular the risks and ramifications of untreated moderate to severe OSA, especially with respect to developing cardiovascular disease down the Road, including congestive heart failure, difficult to treat hypertension, cardiac arrhythmias, or stroke. Even type 2 diabetes has, in part, been linked to untreated OSA. Symptoms of untreated OSA include daytime sleepiness, memory problems, mood irritability and mood disorder such as depression and anxiety, lack of energy, as well as recurrent headaches, especially morning headaches. We talked about trying to maintain a healthy lifestyle in general, as well as the importance of weight control. I encouraged the patient to eat healthy, exercise daily and keep well hydrated, to keep a scheduled bedtime and wake time routine, to not skip any meals and eat healthy snacks in between meals. I advised the patient not to drive when feeling sleepy. I recommended the following at this time: sleep study with potential positive airway pressure titration. (We will score hypopneas at 3% and split the sleep study into diagnostic and treatment portion, if the estimated. 2 hour AHI is >15/h).   I explained the sleep test procedure to the patient and also outlined possible surgical and non-surgical treatment options of OSA, including the use of a custom-made dental device (which would require a referral to a specialist dentist or oral  surgeon), upper airway surgical options, such as pillar implants, radiofrequency surgery, tongue base surgery, and UPPP (which would involve a referral to an ENT surgeon). Rarely, jaw surgery such as mandibular advancement may be considered.  I also explained the CPAP treatment option to the patient, who indicated that she would be willing to try CPAP if the need arises. I explained the importance of being compliant with PAP treatment, not only for insurance purposes but primarily to improve Her symptoms, and for the patient's long term health benefit, including to reduce Her cardiovascular risks. I answered all her questions today and the patient was in agreement. I would like to see her back after the sleep study is completed and encouraged her to call with any interim questions, concerns, problems or updates.   Thank you very much for allowing me to participate in the care of this nice patient. If I can be of any further assistance to you please do not hesitate to call me at [phone].  Sincerely,   Beth FoleySaima Suheyb Raucci, MD, PhD

## 2016-02-01 ENCOUNTER — Other Ambulatory Visit: Payer: Self-pay | Admitting: Family

## 2016-02-01 DIAGNOSIS — F338 Other recurrent depressive disorders: Secondary | ICD-10-CM

## 2016-02-16 ENCOUNTER — Ambulatory Visit (INDEPENDENT_AMBULATORY_CARE_PROVIDER_SITE_OTHER): Payer: BLUE CROSS/BLUE SHIELD | Admitting: Neurology

## 2016-02-16 DIAGNOSIS — G4733 Obstructive sleep apnea (adult) (pediatric): Secondary | ICD-10-CM | POA: Diagnosis not present

## 2016-02-16 DIAGNOSIS — G4710 Hypersomnia, unspecified: Secondary | ICD-10-CM

## 2016-02-19 ENCOUNTER — Telehealth: Payer: Self-pay

## 2016-02-19 NOTE — Telephone Encounter (Signed)
I spoke to pt. I advised her of this information. She says that she will speak to her PCP and her dentist to discuss these possible snoring options. If the snoring treatment does not work or she is unable to get treatment, she said she will call us back to discuss a possible PSG/MSLT. Pt verbalized understanding of this advice.

## 2016-02-19 NOTE — Telephone Encounter (Signed)
I called pt and advised her of her sleep study results. Pt is able to see the technical data on mychart. She is asking about the "snoring events" of 188. Pt wants to know if her snoring is treated, would that help her sleep better? Why does she need 10-12 hours if her sleep looks good? Does Dr. Frances FurbishAthar recommend treating pt's snoring?  I advised pt that I would call her back with Dr. Teofilo PodAthar's response. Pt verbalized understanding.

## 2016-02-19 NOTE — Telephone Encounter (Signed)
-----   Message from Huston FoleySaima Athar, MD sent at 02/19/2016  8:01 AM EST ----- Patient referred by Sharman CheekG. Calone, NP, seen by me on 01/20/16, HST on 02/17/16.   Please call and notify the patient that the recent home sleep test did not show any significant obstructive sleep apnea. Patient can follow up with the referring provider. Once you have spoken to patient, you can close this encounter.   Thanks,  Huston FoleySaima Athar, MD, PhD [facility]

## 2016-02-19 NOTE — Progress Notes (Signed)
Patient referred by Sharman CheekG. Calone, NP, seen by me on 01/20/16, HST on 02/17/16.   Please call and notify the patient that the recent home sleep test did not show any significant obstructive sleep apnea. Patient can follow up with the referring provider. Once you have spoken to patient, you can close this encounter.   Thanks,  Huston FoleySaima Ishan Sanroman, MD, PhD [facility])

## 2016-02-19 NOTE — Procedures (Signed)
  [facility] [address], [address] [place], [place] [address]  NAME: Jeanine LuzGregory Calone, FNP DOB: 01/20/1970 MEDICAL RECORD [id number] DOS: 02/23/16 REFERRING PHYSICIAN: Jeanine LuzGregory Calone, FNP  Study Performed:  HST/Out of Center Sleep Test  HISTORY: 46 year old woman with a history of depression, neck pain, PMS, and psoriasis, who reports a prior diagnosis of obstructive sleep apnea. Epworth sleepiness score is 11 out of 24, her fatigue score is 47 out of 63. BMI is 23.1.   STUDY RESULTS:  Total Recording Time: 6h 8140m   Total Apnea/Hypopnea Index (AHI): 4.4  Average Oxygen Saturation: 95%  Lowest Oxygen Saturation: 90%  Average Mean Heart Rate: 77 bpm  IMPRESSION: Hypersomnia, NOS   RECOMMENDATION:   This home sleep test does not demonstrate any significant obstructive or central sleep disordered breathing. Other causes of the patient's symptoms, including circadian rhythm disturbances, PLMD, an underlying mood disorder, medication effect and/or an underlying medical problem cannot be ruled out based on this test. Clinical correlation is recommended. The patient and her referring provider will be notified of the test results; a follow up appointment will be made as necessary.   I certify that I have reviewed the raw data recording prior to the issuance of this report in accordance with the standards of Accreditation of the American Academy of Sleep medicine (AASM).   Huston FoleySaima Aubreyana Saltz, MD, PhD Diplomat, ABPN (Neurology and Sleep)

## 2016-02-19 NOTE — Telephone Encounter (Signed)
Please call patient back: For disturbing snoring, she may want to consult with ENT and/or her dentist.  Dentist can potentially make an oral appliance and  ENT can talk to her about possible surgical options for treatment of snoring.  Ultimately, if these measures do not pan out and she has ongoing issues with severe sleepiness, we may be able to bring her back for sleep study testing with an overnight sleep study followed by a nap study. The problem is that she did not endorse much in the way of narcolepsy-like symptoms or severe sleepiness. Her sleep apnea score is not enough to justify CPAP her AutoPap treatment.

## 2016-03-17 ENCOUNTER — Ambulatory Visit (INDEPENDENT_AMBULATORY_CARE_PROVIDER_SITE_OTHER): Payer: BLUE CROSS/BLUE SHIELD | Admitting: Family

## 2016-03-17 ENCOUNTER — Encounter: Payer: Self-pay | Admitting: Family

## 2016-03-17 DIAGNOSIS — F339 Major depressive disorder, recurrent, unspecified: Secondary | ICD-10-CM

## 2016-03-17 DIAGNOSIS — F338 Other recurrent depressive disorders: Secondary | ICD-10-CM

## 2016-03-17 MED ORDER — BUPROPION HCL ER (XL) 150 MG PO TB24: 150.0000 mg | ORAL_TABLET | Freq: Every day | ORAL | 0 refills | Status: DC

## 2016-03-17 NOTE — Assessment & Plan Note (Signed)
Seasonal affective disorder appears adequately controlled current medication regimen and no adverse side effects. Continue current dosage of Wellbutrin pending end of season and continued need.

## 2016-03-17 NOTE — Progress Notes (Signed)
Subjective:    Patient ID: Beth Rivera, female    DOB: March 27, 1970, 46 y.o.   MRN: [id number]  Chief Complaint  Patient presents with  . Follow-up    wants to talk about appetite    HPI:  Beth Rivera is a 46 y.o. female who  has a past medical history of Chronic cholecystitis; Complication of anesthesia; Cough (LAST 3 WEEKS); Depression; Family history of adverse reaction to anesthesia; Neck pain; PMS (premenstrual syndrome); PONV (postoperative nausea and vomiting); Poor sleep pattern; Psoriasis; Shoulder pain; and Tinnitus. and presents today for a follow up office visit.   1.) Seasonal affective disorder -  Currently maintained Wellbutrin. Reports taking the medication as prescribed and denies significant adverse side effect but does have a decreased amount of appetite.  Symptoms are generally well controlled with the current medication. Able to eat and has maintained her weight.   Wt Readings from Last 3 Encounters:  03/17/16 142 lb 12.8 oz (64.8 kg)  01/20/16 143 lb (64.9 kg)  12/11/15 143 lb (64.9 kg)     Allergies  Allergen Reactions  . Other     Seasonal, dogs      Outpatient Medications Prior to Visit  Medication Sig Dispense Refill  . cyclobenzaprine (FLEXERIL) 10 MG tablet Take 1 tablet (10 mg total) by mouth 3 (three) times daily as needed for muscle spasms. 30 tablet 0  . ondansetron (ZOFRAN-ODT) 4 MG disintegrating tablet Take 1 tablet (4 mg total) by mouth every 8 (eight) hours as needed for nausea or vomiting. 20 tablet 0  . buPROPion (WELLBUTRIN XL) 150 MG 24 hr tablet take 1 tablet by mouth once daily 30 tablet 0   No facility-administered medications prior to visit.     Review of Systems  Constitutional: Negative for chills and fever.  Respiratory: Negative for chest tightness and shortness of breath.   Neurological: Negative for weakness.  Psychiatric/Behavioral: Negative for dysphoric mood and sleep disturbance. The patient is not  nervous/anxious.       Objective:    BP 120/80 (BP Location: Left Arm, Patient Position: Sitting, Cuff Size: Normal)   Pulse 81   Temp 98.5 F (36.9 C) (Oral)   Resp 16   Ht 5\' 6"  (1.676 m)   Wt 142 lb 12.8 oz (64.8 kg)   SpO2 97%   BMI 23.05 kg/m  Nursing note and vital signs reviewed.  Physical Exam  Constitutional: She is oriented to person, place, and time. She appears well-developed and well-nourished. No distress.  Cardiovascular: Normal rate, regular rhythm, normal heart sounds and intact distal pulses.   Pulmonary/Chest: Effort normal and breath sounds normal.  Neurological: She is alert and oriented to person, place, and time.  Skin: Skin is warm and dry.  Psychiatric: She has a normal mood and affect. Her behavior is normal. Judgment and thought content normal.       Assessment & Plan:   Problem List Items Addressed This Visit      Other   Seasonal affective disorder (HCC)    Seasonal affective disorder appears adequately controlled current medication regimen and no adverse side effects. Continue current dosage of Wellbutrin pending end of season and continued need.      Relevant Medications   buPROPion (WELLBUTRIN XL) 150 MG 24 hr tablet       I have changed Ms. Dimaio's buPROPion. I am also having her maintain her ondansetron and cyclobenzaprine.   Meds ordered this encounter  Medications  . buPROPion (WELLBUTRIN XL)  150 MG 24 hr tablet    Sig: Take 1 tablet (150 mg total) by mouth daily.    Dispense:  90 tablet    Refill:  0    Order Specific Question:   Supervising Provider    Answer:   Hillard DankerRAWFORD, ELIZABETH A [[id number]]     Follow-up: Return if symptoms worsen or fail to improve.  Jeanine Luzalone, [place] Truett, FNP

## 2016-03-17 NOTE — Patient Instructions (Addendum)
Thank you for choosing [facility].  SUMMARY AND INSTRUCTIONS:  Consider follow up with ENT as needed.   Medication:  Please continue to take medication as prescribed.   Your prescription(s) have been submitted to your pharmacy or been printed and provided for you. Please take as directed and contact our office if you believe you are having problem(s) with the medication(s) or have any questions.  Follow up:  If your symptoms worsen or fail to improve, please contact our office for further instruction, or in case of emergency go directly to the emergency room at the closest medical facility.

## 2016-11-17 ENCOUNTER — Ambulatory Visit: Payer: BLUE CROSS/BLUE SHIELD | Admitting: Nurse Practitioner

## 2017-01-04 DIAGNOSIS — D802 Selective deficiency of immunoglobulin A [IgA]: Secondary | ICD-10-CM

## 2017-01-04 HISTORY — PX: UPPER GASTROINTESTINAL ENDOSCOPY: SHX188

## 2017-01-04 HISTORY — DX: Selective deficiency of immunoglobulin a (iga): D80.2

## 2017-01-18 ENCOUNTER — Encounter: Payer: Self-pay | Admitting: Nurse Practitioner

## 2017-01-18 ENCOUNTER — Other Ambulatory Visit (INDEPENDENT_AMBULATORY_CARE_PROVIDER_SITE_OTHER): Payer: BLUE CROSS/BLUE SHIELD

## 2017-01-18 ENCOUNTER — Ambulatory Visit (INDEPENDENT_AMBULATORY_CARE_PROVIDER_SITE_OTHER)
Admission: RE | Admit: 2017-01-18 | Discharge: 2017-01-18 | Disposition: A | Discharge status: Home / Self Care | Payer: BLUE CROSS/BLUE SHIELD | Source: Ambulatory Visit | Attending: Nurse Practitioner | Admitting: Nurse Practitioner

## 2017-01-18 ENCOUNTER — Ambulatory Visit: Payer: BLUE CROSS/BLUE SHIELD | Admitting: Nurse Practitioner

## 2017-01-18 VITALS — BP 120/86 | HR 76 | Temp 99.2°F | Resp 16 | Ht 66.0 in | Wt 127.0 lb

## 2017-01-18 DIAGNOSIS — F458 Other somatoform disorders: Secondary | ICD-10-CM | POA: Diagnosis not present

## 2017-01-18 DIAGNOSIS — M898X1 Other specified disorders of bone, shoulder: Secondary | ICD-10-CM

## 2017-01-18 DIAGNOSIS — S0590XA Unspecified injury of unspecified eye and orbit, initial encounter: Secondary | ICD-10-CM

## 2017-01-18 DIAGNOSIS — R634 Abnormal weight loss: Secondary | ICD-10-CM

## 2017-01-18 DIAGNOSIS — R0989 Other specified symptoms and signs involving the circulatory and respiratory systems: Secondary | ICD-10-CM

## 2017-01-18 DIAGNOSIS — F338 Other recurrent depressive disorders: Secondary | ICD-10-CM

## 2017-01-18 DIAGNOSIS — R05 Cough: Secondary | ICD-10-CM | POA: Diagnosis not present

## 2017-01-18 LAB — CBC WITH DIFFERENTIAL/PLATELET
Basophils Absolute: 0.1 K/uL (ref 0.0–0.1)
Basophils Relative: 1.1 % (ref 0.0–3.0)
Eosinophils Absolute: 0.1 K/uL (ref 0.0–0.7)
Eosinophils Relative: 1.9 % (ref 0.0–5.0)
HCT: 36.3 % (ref 36.0–46.0)
Hemoglobin: 11.7 g/dL — ABNORMAL LOW (ref 12.0–15.0)
Lymphocytes Relative: 52.8 % — ABNORMAL HIGH (ref 12.0–46.0)
Lymphs Abs: 2.9 K/uL (ref 0.7–4.0)
MCHC: 32.2 g/dL (ref 30.0–36.0)
MCV: 81.1 fl (ref 78.0–100.0)
Monocytes Absolute: 0.4 K/uL (ref 0.1–1.0)
Monocytes Relative: 8.1 % (ref 3.0–12.0)
Neutro Abs: 2 K/uL (ref 1.4–7.7)
Neutrophils Relative %: 36.1 % — ABNORMAL LOW (ref 43.0–77.0)
Platelets: 320 K/uL (ref 150.0–400.0)
RBC: 4.48 Mil/uL (ref 3.87–5.11)
RDW: 17.3 % — ABNORMAL HIGH (ref 11.5–15.5)
WBC: 5.5 K/uL (ref 4.0–10.5)

## 2017-01-18 LAB — COMPREHENSIVE METABOLIC PANEL WITH GFR
ALT: 8 U/L (ref 0–35)
AST: 15 U/L (ref 0–37)
Albumin: 4.3 g/dL (ref 3.5–5.2)
Alkaline Phosphatase: 57 U/L (ref 39–117)
BUN: 8 mg/dL (ref 6–23)
CO2: 29 meq/L (ref 19–32)
Calcium: 9.3 mg/dL (ref 8.4–10.5)
Chloride: 102 meq/L (ref 96–112)
Creatinine, Ser: 0.82 mg/dL (ref 0.40–1.20)
GFR: 80 mL/min
Glucose, Bld: 73 mg/dL (ref 70–99)
Potassium: 4.2 meq/L (ref 3.5–5.1)
Sodium: 138 meq/L (ref 135–145)
Total Bilirubin: 0.3 mg/dL (ref 0.2–1.2)
Total Protein: 7.4 g/dL (ref 6.0–8.3)

## 2017-01-18 LAB — C-REACTIVE PROTEIN: CRP: 0.2 mg/dL — AB (ref 0.5–20.0)

## 2017-01-18 LAB — SEDIMENTATION RATE: Sed Rate: 9 mm/hr (ref 0–20)

## 2017-01-18 LAB — TSH: TSH: 0.94 u[IU]/mL (ref 0.35–4.50)

## 2017-01-18 MED ORDER — BUPROPION HCL ER (XL) 150 MG PO TB24: 150.0000 mg | ORAL_TABLET | Freq: Every day | ORAL | 1 refills | Status: DC

## 2017-01-18 MED ORDER — CYCLOBENZAPRINE HCL 10 MG PO TABS: 10.0000 mg | ORAL_TABLET | Freq: Three times a day (TID) | ORAL | 0 refills | Status: DC | PRN

## 2017-01-18 NOTE — Progress Notes (Addendum)
Name: Beth Rivera   MRN: [id number]    DOB: 1970/10/15   Date:01/18/2017       Progress Note  Subjective  Chief Complaint  Chief Complaint  Patient presents with  . Establish Care    weight loss since gallbladder surgery 2 years ago, no appetite, bump by eye, collar bone    HPI Beth Rivera presents today to establish care. She has several acute complaints.  Weight loss This is not a new problem This problem began about 2 years ago after she had her gallbladder removed. She says it took about 1.5 years to recover from gallbladder surgery- with frequent nausea and vomiting over that time. For about 6 months now, she has felt better overall and the nausea and vomiting have resolved but she has noticed about  A 15 lb weight loss since last year. She reports continued decreased appetite-she says that foods don't taste good and she feels full quickly. She reports intermittent lightheadedness, a recent dry cough that's improving. She denies fevers, weakness, syncope, lymphadenopathy, chest pain, shortness of breath, abdominal pain, pelvic pain, dysuria, urinary frequency, constipation, diarrhea, anxiety or depression She is maintained on Wellbutrin for depression which she feels is working well. She has never been a smoker. She does not use illegal drugs. She drinks about 2 alcoholic drinks per week. She has a family history of breast cancer- her mothers sister. She does follow with GYN and says she had a normal routine mammogram in September 2018 ordered by GYN.  Wt Readings from Last 3 Encounters:  01/18/17 127 lb (57.6 kg)  03/17/16 142 lb 12.8 oz (64.8 kg)  01/20/16 143 lb (64.9 kg)   Left eye problem- This is a new problem. This problem began about 1 month ago, when a handle bar fell onto her eye. She initially had some bruising and pain which has resolved. She has noticed a hard knot under her skin to the outer corner of her left eye since. She denies LOC, headaches, vision  changes, redness, swelling.  Left clavicle pain- This is a chronic problem. This is her first encounter for this problem. She describes as an aching pulling pain to her left clavicle. The pain is increased with movement. She does not recall any injury to her clavicle.  She denies falls, redness, bruising, discoloration, decreased range of motion. She denies any limitations of daily activity related to the pain. She has tried stretching daily with minimal improvement.  Patient Active Problem List   Diagnosis Date Noted  . Seasonal affective disorder ([place]) 12/11/2015  . Hypersomnia 12/11/2015  . Non-intractable vomiting with nausea 12/11/2015  . Globus pharyngeus 12/11/2015  . Cholelithiasis with chronic cholecystitis 02/02/2015    Social History   Tobacco Use  . Smoking status: Never Smoker  . Smokeless tobacco: Never Used  Substance Use Topics  . Alcohol use: Yes    Alcohol/week: 1.0 - 2.0 oz    Types: 2 - 4 Standard drinks or equivalent per week    Comment: OCCASIONAL     Current Outpatient Medications:  .  buPROPion (WELLBUTRIN XL) 150 MG 24 hr tablet, Take 1 tablet (150 mg total) by mouth daily., Disp: 90 tablet, Rfl: 0 .  cyclobenzaprine (FLEXERIL) 10 MG tablet, Take 1 tablet (10 mg total) by mouth 3 (three) times daily as needed for muscle spasms., Disp: 30 tablet, Rfl: 0 .  Multiple Vitamins-Minerals (MULTIVITAMIN ADULT PO), Take by mouth., Disp: , Rfl:   Allergies  Allergen Reactions  . Other  Seasonal, dogs    ROS See HPI  Objective  Vitals:   01/18/17 1059  BP: 120/86  Pulse: 76  Resp: 16  Temp: 99.2 F (37.3 C)  TempSrc: Oral  SpO2: 99%  Weight: 127 lb (57.6 kg)  Height: '5\' 6"'$  (1.676 m)    Body mass index is 20.5 kg/m.  Nursing Note and Vital Signs reviewed.  Physical Exam  Constitutional: She is oriented to person, place, and time and well-developed, well-nourished, and in no distress.  HENT:  Head: Normocephalic and atraumatic.    Right Ear: External ear normal.  Left Ear: External ear normal.  Eyes: Conjunctivae and EOM are normal. Pupils are equal, round, and reactive to light. Right eye exhibits no discharge. Left eye exhibits no discharge.    Palpable firm mass to illustrated landmark, nontender, no discoloration, smaller than fingertip.  Neck: Normal range of motion. Neck supple. No tracheal deviation present. No thyromegaly present.  Cardiovascular: Normal rate, regular rhythm, normal heart sounds and intact distal pulses.  Pulmonary/Chest: Effort normal and breath sounds normal. No respiratory distress.  Abdominal: Soft. Bowel sounds are normal. She exhibits no distension. There is no hepatosplenomegaly. There is no tenderness. There is no CVA tenderness.  Musculoskeletal: Normal range of motion. She exhibits no edema, tenderness or deformity.       Left shoulder: She exhibits normal range of motion, no tenderness, no bony tenderness, no swelling and normal strength.  Lymphadenopathy:    She has no cervical adenopathy.  Neurological: She is alert and oriented to person, place, and time. Gait normal.  Skin: Skin is warm and dry.  Psychiatric: Mood, affect and judgment normal.  Vitals reviewed.   Assessment & Plan RTC in 1 month for f/u  Weight loss Diagnostic testing ordered: - CBC with Differential/Platelet; Future - Comprehensive metabolic panel; Future - TSH; Future - Sed Rate (ESR); Future - C-reactive protein; Future - DG Chest 2 View; Future  Eye injury, initial encounter No worrisome findings on history or physical exam. Symptoms likely due to healing process. Return precautions given. Will consider imagining if symptoms persist.  Clavicle pain Diagnostic testing ordered: - DG Chest 2 View; Future Will refer to sports medicine if x-ray is normal and pain continues.  -Red flags and when to present for emergency care or RTC including fever >101.45F, headaches, vision changes, chest pain,  shortness of breath, worsening symptoms. reviewed with patient at time of visit. Follow up and care instructions discussed and provided in AVS.

## 2017-01-18 NOTE — Patient Instructions (Addendum)
Please head downstairs for lab work/x-ray.  I'd like to see you back in about 1 month, to see how you are doing.  Please follow up sooner for any fever >101.71F, headaches, vision changes, chest pain, shortness of breath, worsening symptoms.  It was good to meet you. Thanks for letting me take care of you today :)

## 2017-01-18 NOTE — Addendum Note (Signed)
Addended by: Mercer PodWRENN, Tirso Laws E on: 01/18/2017 12:33 PM   Modules accepted: Orders

## 2017-01-24 ENCOUNTER — Other Ambulatory Visit: Payer: Self-pay | Admitting: Nurse Practitioner

## 2017-01-24 DIAGNOSIS — R634 Abnormal weight loss: Secondary | ICD-10-CM

## 2017-01-25 ENCOUNTER — Encounter: Payer: Self-pay | Admitting: Gastroenterology

## 2017-02-01 ENCOUNTER — Encounter: Payer: Self-pay | Admitting: Nurse Practitioner

## 2017-02-01 DIAGNOSIS — R634 Abnormal weight loss: Secondary | ICD-10-CM

## 2017-02-03 ENCOUNTER — Encounter: Payer: Self-pay | Admitting: Family Medicine

## 2017-02-03 ENCOUNTER — Ambulatory Visit: Payer: BLUE CROSS/BLUE SHIELD | Admitting: Family Medicine

## 2017-02-03 VITALS — BP 112/64 | HR 69 | Temp 98.1°F | Ht 66.0 in | Wt 126.0 lb

## 2017-02-03 DIAGNOSIS — G8929 Other chronic pain: Secondary | ICD-10-CM

## 2017-02-03 DIAGNOSIS — M25512 Pain in left shoulder: Secondary | ICD-10-CM | POA: Diagnosis not present

## 2017-02-03 NOTE — Assessment & Plan Note (Signed)
This appears to be a capsule issue. Her movement in ER is mildly limited but rotator cuff looks good on US and on exam. Doesn't appear to be a neck issue.  - referral to PT  - if no improvement can try a GH injection or MRI shoulder.

## 2017-02-03 NOTE — Patient Instructions (Signed)
Please try the sleeper stretch to see if that improves your range of motion  Please continue to try to use the shoulder and exercise You could try a body helix shoulder wrap to wear during tennis to see if that helps.

## 2017-02-03 NOTE — Progress Notes (Signed)
Beth MuscaRachel Rivera - 47 y.o. female MRN [id number]  Date of birth: 10/30/1970  SUBJECTIVE:  Including CC & ROS.  Chief Complaint  Patient presents with  . Shoulder Pain    Beth Rivera is a 47 y.o. female that is presenting with left shoulder and clavicle pain. She states the pain has been chronic in nature. Pain is described as an achy pain. Located in the anterior aspect of her left shoulder radiates upward to her clavicle. She is very active, she started playing tennis in May. Denies tingling or numbness. She does admit to some weakness raising her arm. Denies injury of surgery. She feels a pain and weakness when she is performing a backhand. She is right handed. The pain is localized. Denies any bruising or swelling. She feels that she doesn't have the range of motion when she is raising her arms over her head.   Independent review of the chest xray from 1/15 shows normal clavicle.    Review of Systems  Constitutional: Negative for fever.  Respiratory: Negative for cough.   Cardiovascular: Negative for chest pain.  Gastrointestinal: Negative for abdominal pain.  Musculoskeletal: Negative for back pain.  Skin: Negative for color change.  Neurological: Positive for weakness.  Hematological: Negative for adenopathy.  Psychiatric/Behavioral: Negative for agitation.    HISTORY: Past Medical, Surgical, Social, and Family History Reviewed & Updated per EMR.   Pertinent Historical Findings include:  Past Medical History:  Diagnosis Date  . Chronic cholecystitis   . Complication of anesthesia   . Cough LAST 3 WEEKS   CLEAR SPUTUM, NO FEVER   . Depression   . Family history of adverse reaction to anesthesia    SON HAS PONV  . Neck pain   . PMS (premenstrual syndrome)   . PONV (postoperative nausea and vomiting)   . Poor sleep pattern   . Psoriasis    GUTATE, MOSTLY ON LIMBS  . Shoulder pain   . Tinnitus    BOTH EARS    Past Surgical History:  Procedure Laterality Date  .  CHOLECYSTECTOMY N/A 02/03/2015   Procedure: LAPAROSCOPIC CHOLECYSTECTOMY WITH INTRAOPERATIVE CHOLANGIOGRAM;  Surgeon: Beth Levelodd Gerkin, MD;  Location: [facility];  Service: General;  Laterality: N/A;  . WISDOM TOOTH EXTRACTION      Allergies  Allergen Reactions  . Other     Seasonal, dogs    Family History  Problem Relation Age of Onset  . Heart disease Paternal Grandfather   . Diabetes Paternal Grandfather   . Heart disease Paternal Grandmother   . Diabetes Paternal Grandmother   . Hernia Father   . Diabetes Father   . Other Father        joint problems  . Thyroid disease Mother   . Other Mother        blood transfusion  . Hepatitis C Mother        contracted from blood transfusion  . Thyroid disease Maternal Aunt   . Thyroid disease Maternal Aunt   . Other Paternal Aunt        joint problems     Social History   Socioeconomic History  . Marital status: Married    Spouse name: Not on file  . Number of children: 2  . Years of education: BA  . Highest education level: Not on file  Social Needs  . Financial resource strain: Not on file  . Food insecurity - worry: Not on file  . Food insecurity - inability: Not on file  . Transportation  needs - medical: Not on file  . Transportation needs - non-medical: Not on file  Occupational History  . Not on file  Tobacco Use  . Smoking status: Never Smoker  . Smokeless tobacco: Never Used  Substance and Sexual Activity  . Alcohol use: Yes    Alcohol/week: 1.0 - 2.0 oz    Types: 2 - 4 Standard drinks or equivalent per week    Comment: OCCASIONAL  . Drug use: No  . Sexual activity: Yes    Partners: Male    Birth control/protection: Surgical    Comment: husband vasectomy   Other Topics Concern  . Not on file  Social History Narrative   Fun: Biking, hiking and gardening.   Denies abuse and feels safe at home.    Drinks 1 cup of coffee and green tea a day      PHYSICAL EXAM:  VS: BP 112/64 (BP Location: Left Arm, Patient  Position: Sitting, Cuff Size: Normal)   Pulse 69   Temp 98.1 F (36.7 C) (Oral)   Ht 5\' 6"  (1.676 m)   Wt 126 lb (57.2 kg)   SpO2 100%   BMI 20.34 kg/m  Physical Exam Gen: NAD, alert, cooperative with exam, well-appearing ENT: normal lips, normal nasal mucosa,  Eye: normal EOM, normal conjunctiva and lids CV:  no edema, +2 pedal pulses   Resp: no accessory muscle use, non-labored,  GI: no masses or tenderness, no hernia  Skin: no rashes, no areas of induration  Neuro: normal tone, normal sensation to touch Psych:  normal insight, alert and oriented MSK:  Left shoulder:  No TTP of the clavicle or AC joint  Normal active abduction and flexion  ER is normal but decreased compared to right.  Normal strength to resistance with IR and ER  Normal empty can testing  Normal Hawkin's testing  Normal cross arm test  Normal O'Brien's  Neurovascularly intact.   Limited ultrasound: left shoulder:  Normal appearing BT in short and long axis  Normal subscapularis in static and dynamic testing  Normal supraspinatus in static and dynamic testing  Normal infraspinatus  Normal dynamic testing of posterior GH  Normal AC joint  Normal appearing clavicle  Normal trapezius   Summary: normal exam   Ultrasound and interpretation by Clare Gandy, MD           ASSESSMENT & PLAN:   Chronic left shoulder pain This appears to be a capsule issue. Her movement in ER is mildly limited but rotator cuff looks good on [place] and on exam. Doesn't appear to be a neck issue.  - referral to PT  - if no improvement can try a GH injection or MRI shoulder.

## 2017-02-21 ENCOUNTER — Ambulatory Visit: Payer: BLUE CROSS/BLUE SHIELD | Admitting: Nurse Practitioner

## 2017-02-21 ENCOUNTER — Ambulatory Visit: Payer: BLUE CROSS/BLUE SHIELD | Admitting: Family

## 2017-03-02 ENCOUNTER — Other Ambulatory Visit (INDEPENDENT_AMBULATORY_CARE_PROVIDER_SITE_OTHER): Payer: BLUE CROSS/BLUE SHIELD

## 2017-03-02 ENCOUNTER — Ambulatory Visit (INDEPENDENT_AMBULATORY_CARE_PROVIDER_SITE_OTHER): Payer: BLUE CROSS/BLUE SHIELD | Admitting: Gastroenterology

## 2017-03-02 ENCOUNTER — Telehealth: Payer: Self-pay | Admitting: Nurse Practitioner

## 2017-03-02 ENCOUNTER — Encounter: Payer: Self-pay | Admitting: Gastroenterology

## 2017-03-02 ENCOUNTER — Other Ambulatory Visit: Payer: Self-pay

## 2017-03-02 VITALS — BP 110/70 | HR 64 | Ht 66.0 in | Wt 127.4 lb

## 2017-03-02 DIAGNOSIS — R634 Abnormal weight loss: Secondary | ICD-10-CM | POA: Diagnosis not present

## 2017-03-02 DIAGNOSIS — R112 Nausea with vomiting, unspecified: Secondary | ICD-10-CM

## 2017-03-02 DIAGNOSIS — K219 Gastro-esophageal reflux disease without esophagitis: Secondary | ICD-10-CM

## 2017-03-02 DIAGNOSIS — D649 Anemia, unspecified: Secondary | ICD-10-CM

## 2017-03-02 DIAGNOSIS — F338 Other recurrent depressive disorders: Secondary | ICD-10-CM

## 2017-03-02 DIAGNOSIS — D509 Iron deficiency anemia, unspecified: Secondary | ICD-10-CM

## 2017-03-02 LAB — FERRITIN: FERRITIN: 6.4 ng/mL — AB (ref 10.0–291.0)

## 2017-03-02 LAB — IBC PANEL
Iron: 17 ug/dL — ABNORMAL LOW (ref 42–145)
SATURATION RATIOS: 3.6 % — AB (ref 20.0–50.0)
TRANSFERRIN: 336 mg/dL (ref 212.0–360.0)

## 2017-03-02 LAB — VITAMIN B12: VITAMIN B 12: 328 pg/mL (ref 211–911)

## 2017-03-02 LAB — FOLATE: FOLATE: 13.3 ng/mL (ref >5.9)

## 2017-03-02 LAB — IGA: IGA: 1 mg/dL — AB (ref 68–378)

## 2017-03-02 MED ORDER — BUPROPION HCL ER (XL) 150 MG PO TB24: 150.0000 mg | ORAL_TABLET | Freq: Every day | ORAL | 1 refills | Status: DC

## 2017-03-02 MED ORDER — IRON 325 (65 FE) MG PO TABS: 325.0000 mg | ORAL_TABLET | Freq: Two times a day (BID) | ORAL | 0 refills | Status: DC

## 2017-03-02 NOTE — Telephone Encounter (Signed)
Patient came by the office today requesting that her buPROPion (WELLBUTRIN XL) 150 MG 24 hr tablet be sent as a 90 day supply to [facility] [place] [id number]. She is [facility] to [facility] to [facility] and is needing this refill now.

## 2017-03-02 NOTE — Patient Instructions (Signed)
Your physician has requested that you go to the basement for lab work before leaving today.  Follow the instructions on the Hemoccult cards and mail them back to [place] when you are finished or you may take them directly to the lab in the basement of the [facility] building. We will call you with the results.   You have been scheduled for a CT scan of the abdomen and pelvis at [facility] ([address]---this is in the same building as [facility]).   You are scheduled on 03/09/17 at 9:00am. You should arrive 15 minutes prior to your appointment time for registration. Please follow the written instructions below on the day of your exam:  WARNING: IF YOU ARE ALLERGIC TO IODINE/X-RAY DYE, PLEASE NOTIFY RADIOLOGY IMMEDIATELY AT [phone]! YOU WILL BE GIVEN A 13 HOUR PREMEDICATION PREP.  1) Do not eat or drink anything after 5:00am (4 hours prior to your test) 2) You have been given 2 bottles of oral contrast to drink. The solution may taste               better if refrigerated, but do NOT add ice or any other liquid to this solution. Shake             well before drinking.    Drink 1 bottle of contrast @ 7:00am (2 hours prior to your exam)  Drink 1 bottle of contrast @ 8:00am (1 hour prior to your exam)  You may take any medications as prescribed with a small amount of water except for the following: Metformin, Glucophage, Glucovance, Avandamet, Riomet, Fortamet, Actoplus Met, Janumet, Glumetza or Metaglip. The above medications must be held the day of the exam AND 48 hours after the exam.  The purpose of you drinking the oral contrast is to aid in the visualization of your intestinal tract. The contrast solution may cause some diarrhea. Before your exam is started, you will be given a small amount of fluid to drink. Depending on your individual set of symptoms, you may also receive an intravenous injection of x-ray contrast/dye. Plan on being at [facility] for 30 minutes or  longer, depending on the type of exam you are having performed.  This test typically takes 30-45 minutes to complete.  If you have any questions regarding your exam or if you need to reschedule, you may call the CT department at [phone] between the hours of 8:00 am and 5:00 pm, Monday-Friday.  ________________________________________________________________________   Dennis Bast have been scheduled for an endoscopy. Please follow written instructions given to you at your visit today. If you use inhalers (even only as needed), please bring them with you on the day of your procedure. Your physician has requested that you go to www.startemmi.com and enter the access code given to you at your visit today. This web site gives a general overview about your procedure. However, you should still follow specific instructions given to you by our office regarding your preparation for the procedure.   Thank you for choosing me and [facility].  Pricilla Riffle. Dagoberto Ligas., MD., [facility] Regal

## 2017-03-02 NOTE — Telephone Encounter (Signed)
Updated pharmacy and [facility]

## 2017-03-02 NOTE — Progress Notes (Signed)
History of Present Illness: This is a 47 year old female referred by Evaristo BuryShambley, Ashleigh N, NP for the evaluation of weight loss, poor appetite, nausea, vomiting, early satiety.  She relates a gradual 20-25 pound weight loss since her lap chole in 01/2015.  She notes significant problems with recurrent nausea, vomiting, heartburn and early satiety that went on for months following her lap chole.  She adjusted her diet and now eats much smaller meals and has control of her symptoms. She continues to lose weight however she is no longer having nausea, vomiting or heartburn.  Following her cholecystectomy she had diarrhea for several months but this resolved.  Her bowel pattern prior to her lap chole was mild chronic constipation.  Her bowel habits have been normal since her diarrhea resolved.  Hb=11.7 and she has been told of iron deficiency in the past. Recent CMP, TSH were normal. Denies abdominal pain, change in stool caliber, melena, hematochezia, chest pain.  Abd US 12/2014 IMPRESSION: 1. Study is positive for cholelithiasis, but there are no findings to suggest an acute cholecystitis at this time. 2. Otherwise normal abdominal ultrasound   Allergies  Allergen Reactions  . Other     Seasonal, dogs   Outpatient Medications Prior to Visit  Medication Sig Dispense Refill  . buPROPion (WELLBUTRIN XL) 150 MG 24 hr tablet Take 1 tablet (150 mg total) by mouth daily. 90 tablet 1  . cetirizine (KLS ALLER-TEC) 10 MG tablet Take 10 mg by mouth daily.    . cyclobenzaprine (FLEXERIL) 10 MG tablet Take 1 tablet (10 mg total) by mouth 3 (three) times daily as needed for muscle spasms. 30 tablet 0  . Multiple Vitamins-Minerals (MULTIVITAMIN ADULT PO) Take by mouth.     No facility-administered medications prior to visit.    Past Medical History:  Diagnosis Date  . Chronic cholecystitis   . Complication of anesthesia   . Cough LAST 3 WEEKS   CLEAR SPUTUM, NO FEVER   . Depression   . Family history  of adverse reaction to anesthesia    SON HAS PONV  . Neck pain   . PMS (premenstrual syndrome)   . PONV (postoperative nausea and vomiting)   . Poor sleep pattern   . Psoriasis    GUTATE, MOSTLY ON LIMBS  . Shoulder pain   . Tinnitus    BOTH EARS   Past Surgical History:  Procedure Laterality Date  . CHOLECYSTECTOMY N/A 02/03/2015   Procedure: LAPAROSCOPIC CHOLECYSTECTOMY WITH INTRAOPERATIVE CHOLANGIOGRAM;  Surgeon: Darnell Levelodd Gerkin, MD;  Location: [facility];  Service: General;  Laterality: N/A;  . WISDOM TOOTH EXTRACTION     Social History   Socioeconomic History  . Marital status: Married    Spouse name: None  . Number of children: 2  . Years of education: BA  . Highest education level: None  Social Needs  . Financial resource strain: None  . Food insecurity - worry: None  . Food insecurity - inability: None  . Transportation needs - medical: None  . Transportation needs - non-medical: None  Occupational History  . None  Tobacco Use  . Smoking status: Never Smoker  . Smokeless tobacco: Never Used  Substance and Sexual Activity  . Alcohol use: Yes    Alcohol/week: 1.0 - 2.0 oz    Types: 2 - 4 Standard drinks or equivalent per week    Comment: OCCASIONAL  . Drug use: No  . Sexual activity: Yes    Partners: Male  Birth control/protection: Surgical    Comment: husband vasectomy   Other Topics Concern  . None  Social History Narrative   Fun: Biking, hiking and gardening.   Denies abuse and feels safe at home.    Drinks 1 cup of coffee and green tea a day    Family History  Problem Relation Age of Onset  . Heart disease Paternal Grandfather   . Diabetes Paternal Grandfather   . Heart disease Paternal Grandmother   . Diabetes Paternal Grandmother   . Hernia Father   . Diabetes Father   . Other Father        joint problems  . Thyroid disease Mother   . Other Mother        blood transfusion  . Hepatitis C Mother        contracted from blood transfusion  .  Thyroid disease Maternal Aunt   . Thyroid disease Maternal Aunt   . Other Paternal Aunt        joint problems      Review of Systems: Pertinent positive and negative review of systems were noted in the above HPI section. All other review of systems were otherwise negative.   Physical Exam: General: Well developed, well nourished, no acute distress Head: Normocephalic and atraumatic Eyes:  sclerae anicteric, EOMI Ears: Normal auditory acuity Mouth: No deformity or lesions Neck: Supple, no masses or thyromegaly Lungs: Clear throughout to auscultation Heart: Regular rate and rhythm; no murmurs, rubs or bruits Abdomen: Soft, non tender and non distended. No masses, hepatosplenomegaly or hernias noted. Normal Bowel sounds Musculoskeletal: Symmetrical with no gross deformities  Skin: No lesions on visible extremities Pulses:  Normal pulses noted Extremities: No clubbing, cyanosis, edema or deformities noted Neurological: Alert oriented x 4, grossly nonfocal Cervical Nodes:  No significant cervical adenopathy Inguinal Nodes: No significant inguinal adenopathy Psychological:  Alert and cooperative. Normal mood and affect  Assessment and Recommendations:  1.  Unexplained weight loss, early satiety, history of recurrent nausea/vomiting, GERD.  Rule out occult malignancy, esophagitis, ulcer celiac disease.  Schedule abd/pelvic CT, schedule EGD, obtain stool hemoccults, tTG & IgA. The risks (including bleeding, perforation, infection, missed lesions, medication reactions and possible hospitalization or surgery if complications occur), benefits, and alternatives to endoscopy with possible biopsy and possible dilation were discussed with the patient and they consent to proceed.   2. Anemia. Fe, TIBC, ferritin, B12, folate today.     cc: Evaristo Bury, NP [address] [place], [place] [address]

## 2017-03-03 LAB — TISSUE TRANSGLUTAMINASE, IGA: (tTG) Ab, IgA: 1 U/mL

## 2017-03-09 ENCOUNTER — Ambulatory Visit (INDEPENDENT_AMBULATORY_CARE_PROVIDER_SITE_OTHER)
Admission: RE | Admit: 2017-03-09 | Discharge: 2017-03-09 | Disposition: A | Discharge status: Home / Self Care | Payer: BLUE CROSS/BLUE SHIELD | Source: Ambulatory Visit | Attending: Gastroenterology | Admitting: Gastroenterology

## 2017-03-09 DIAGNOSIS — R1110 Vomiting, unspecified: Secondary | ICD-10-CM | POA: Diagnosis not present

## 2017-03-09 DIAGNOSIS — D649 Anemia, unspecified: Secondary | ICD-10-CM

## 2017-03-09 DIAGNOSIS — R112 Nausea with vomiting, unspecified: Secondary | ICD-10-CM

## 2017-03-09 DIAGNOSIS — R634 Abnormal weight loss: Secondary | ICD-10-CM

## 2017-03-09 DIAGNOSIS — R109 Unspecified abdominal pain: Secondary | ICD-10-CM | POA: Diagnosis not present

## 2017-03-09 MED ORDER — IOPAMIDOL (ISOVUE-300) INJECTION 61%
100.0000 mL | Freq: Once | INTRAVENOUS | Status: AC | PRN
  Administered 2017-03-09: 100 mL via INTRAVENOUS

## 2017-03-11 DIAGNOSIS — G500 Trigeminal neuralgia: Secondary | ICD-10-CM | POA: Insufficient documentation

## 2017-03-11 HISTORY — DX: Trigeminal neuralgia: G50.0

## 2017-03-14 ENCOUNTER — Ambulatory Visit (INDEPENDENT_AMBULATORY_CARE_PROVIDER_SITE_OTHER): Payer: BLUE CROSS/BLUE SHIELD | Admitting: Internal Medicine

## 2017-03-14 ENCOUNTER — Encounter: Payer: Self-pay | Admitting: Internal Medicine

## 2017-03-14 DIAGNOSIS — J069 Acute upper respiratory infection, unspecified: Secondary | ICD-10-CM

## 2017-03-14 DIAGNOSIS — B9789 Other viral agents as the cause of diseases classified elsewhere: Secondary | ICD-10-CM

## 2017-03-14 MED ORDER — AZELASTINE-FLUTICASONE 137-50 MCG/ACT NA SUSP: 2.0000 | Freq: Every day | NASAL | 6 refills | Status: DC

## 2017-03-14 NOTE — Progress Notes (Signed)
   Subjective:    Patient ID: Beth Rivera, female    DOB: 12/30/1970, 47 y.o.   MRN: [id number]  HPI The patient is a 47 YO female coming in for pain in the left ear. Started about 1 week ago now and overall increasing. She denies fevers or chills. She is having nasal drainage yellow and some mild cough. Mostly non-productive but rare yellow sputum. Denies SOB. No sleeping problems. She is taking zyrtec over the counter. Denies using anything else for the problems. Denies headaches or muscle aches.   Review of Systems  Constitutional: Negative for activity change, appetite change, chills, fatigue, fever and unexpected weight change.  HENT: Positive for congestion, ear pain, postnasal drip, rhinorrhea and sinus pressure. Negative for ear discharge, sinus pain, sneezing, sore throat, tinnitus, trouble swallowing and voice change.   Eyes: Negative.   Respiratory: Positive for cough. Negative for chest tightness, shortness of breath and wheezing.   Cardiovascular: Negative.   Gastrointestinal: Negative.   Musculoskeletal: Negative for arthralgias and myalgias.  Neurological: Negative.       Objective:   Physical Exam  Constitutional: She is oriented to person, place, and time. She appears well-developed and well-nourished.  HENT:  Head: Normocephalic and atraumatic.  Oropharynx with redness and clear drainage, nose with swollen turbinates, TMs normal bilaterally  Eyes: EOM are normal.  Neck: Normal range of motion. No thyromegaly present.  Cardiovascular: Normal rate and regular rhythm.  Pulmonary/Chest: Effort normal and breath sounds normal. No respiratory distress. She has no wheezes. She has no rales.  Abdominal: Soft.  Lymphadenopathy:    She has no cervical adenopathy.  Neurological: She is alert and oriented to person, place, and time.  Skin: Skin is warm and dry.   Vitals:   03/14/17 1416  BP: 100/78  Pulse: 83  Temp: 99.2 F (37.3 C)  TempSrc: Oral  SpO2: 98%    Weight: 125 lb (56.7 kg)  Height: 5\' 6"  (1.676 m)      Assessment & Plan:

## 2017-03-14 NOTE — Patient Instructions (Signed)
We have sent in dymista to use 2 sprays in each nostril once a day for the next 1-2 weeks.   If you are not better by Friday call and let us know or send a mychart message.

## 2017-03-15 ENCOUNTER — Encounter: Payer: Self-pay | Admitting: Internal Medicine

## 2017-03-15 DIAGNOSIS — J069 Acute upper respiratory infection, unspecified: Secondary | ICD-10-CM | POA: Insufficient documentation

## 2017-03-15 DIAGNOSIS — B9789 Other viral agents as the cause of diseases classified elsewhere: Principal | ICD-10-CM

## 2017-03-15 NOTE — Assessment & Plan Note (Signed)
Continue zyrtec and add dymista for better control of drainage. She denies need for cough medicine. No indication for antibiotics or steroids today.

## 2017-03-18 ENCOUNTER — Other Ambulatory Visit (INDEPENDENT_AMBULATORY_CARE_PROVIDER_SITE_OTHER): Payer: BLUE CROSS/BLUE SHIELD

## 2017-03-18 DIAGNOSIS — R112 Nausea with vomiting, unspecified: Secondary | ICD-10-CM

## 2017-03-18 DIAGNOSIS — D649 Anemia, unspecified: Secondary | ICD-10-CM | POA: Diagnosis not present

## 2017-03-18 DIAGNOSIS — R634 Abnormal weight loss: Secondary | ICD-10-CM

## 2017-03-18 DIAGNOSIS — G500 Trigeminal neuralgia: Secondary | ICD-10-CM | POA: Diagnosis not present

## 2017-03-18 LAB — HEMOCCULT SLIDES (X 3 CARDS)
Fecal Occult Blood: NEGATIVE
OCCULT 1: NEGATIVE
OCCULT 2: NEGATIVE
OCCULT 3: NEGATIVE
OCCULT 4: NEGATIVE
OCCULT 5: NEGATIVE

## 2017-03-21 ENCOUNTER — Other Ambulatory Visit: Payer: BLUE CROSS/BLUE SHIELD

## 2017-03-21 ENCOUNTER — Ambulatory Visit: Payer: BLUE CROSS/BLUE SHIELD | Admitting: Nurse Practitioner

## 2017-03-21 ENCOUNTER — Encounter: Payer: Self-pay | Admitting: Nurse Practitioner

## 2017-03-21 VITALS — BP 124/88 | HR 104 | Temp 98.7°F | Resp 16 | Ht 66.0 in | Wt 120.6 lb

## 2017-03-21 DIAGNOSIS — M2550 Pain in unspecified joint: Secondary | ICD-10-CM | POA: Diagnosis not present

## 2017-03-21 DIAGNOSIS — J309 Allergic rhinitis, unspecified: Secondary | ICD-10-CM

## 2017-03-21 DIAGNOSIS — D802 Selective deficiency of immunoglobulin A [IgA]: Secondary | ICD-10-CM | POA: Diagnosis not present

## 2017-03-21 DIAGNOSIS — R634 Abnormal weight loss: Secondary | ICD-10-CM

## 2017-03-21 MED ORDER — FLUTICASONE PROPIONATE 50 MCG/ACT NA SUSP
2.0000 | Freq: Every day | NASAL | 6 refills | Status: DC
Start: 2017-03-21 — End: 2020-06-11

## 2017-03-21 NOTE — Progress Notes (Signed)
Name: Beth Rivera   MRN: [id number]    DOB: 1970/08/05   Date:03/21/2017       Progress Note  Subjective  Chief Complaint  Chief Complaint  Patient presents with  . Follow-up    discuss flexeril refill, check ears    HPI She is here today for weight loss follow up. She would also like to talk about her ears popping, flexeril and new diagnosis of IGA deficiency  Weight loss- This is not a new problem The weight loss seemed to begin after cholecystectomy several years ago  First seen by me for this complaint on 1/15 and CBC w diff, CMET, TSH, sed rate, CRP and chst xray were ordered and did not show any indications for weight loss so she was referred to gastroenterology for further evaluation.  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 She has a history of seasonal allergies  Neck pain-  This is a chronic issue She was given flexeril PRN and is following with sports medicine for neck and shoulder pain She is wondering if it is safe to continue her flexeril after a new diagnosis of trigeminal neuralgia by neurology- she was told this could interact with her new medication tegretol She denies weakness, confusion, syncope.  Patient Active Problem List   Diagnosis Date Noted  . Viral URI with cough 03/15/2017  . Chronic left shoulder pain 02/03/2017  . Seasonal affective disorder (HCC) 12/11/2015  . Hypersomnia 12/11/2015  . Non-intractable vomiting with nausea 12/11/2015  . Globus  pharyngeus 12/11/2015  . Cholelithiasis with chronic cholecystitis 02/02/2015    Past Surgical History:  Procedure Laterality Date  . CHOLECYSTECTOMY N/A 02/03/2015   Procedure: LAPAROSCOPIC CHOLECYSTECTOMY WITH INTRAOPERATIVE CHOLANGIOGRAM;  Surgeon: Darnell Level, MD;  Location: [facility];  Service: General;  Laterality: N/A;  . WISDOM TOOTH EXTRACTION      Family History  Problem Relation Age of Onset  . Heart disease Paternal Grandfather   . Diabetes Paternal Grandfather   . Heart disease Paternal Grandmother   . Diabetes Paternal Grandmother   . Hernia Father   . Diabetes Father   . Other Father        joint problems  . Thyroid disease Mother   . Other Mother        blood transfusion  . Hepatitis C Mother        contracted from blood transfusion  . Thyroid disease Maternal Aunt   . Thyroid disease Maternal Aunt   . Other Paternal Aunt        joint problems    Social History   Socioeconomic History  . Marital status: Married    Spouse name: Not on file  . Number of children: 2  . Years of education: BA  . Highest education level: Not on file  Social Needs  . Financial resource strain: Not on file  . Food insecurity - worry: Not on file  . Food insecurity - inability: Not on file  .  Transportation needs - medical: Not on file  . Transportation needs - non-medical: Not on file  Occupational History  . Not on file  Tobacco Use  . Smoking status: Never Smoker  . Smokeless tobacco: Never Used  Substance and Sexual Activity  . Alcohol use: Yes    Alcohol/week: 1.0 - 2.0 oz    Types: 2 - 4 Standard drinks or equivalent per week    Comment: OCCASIONAL  . Drug use: No  . Sexual activity: Yes    Partners: Male    Birth control/protection: Surgical    Comment: husband vasectomy   Other Topics Concern  . Not on file  Social History Narrative   Fun: Biking, hiking and gardening.   Denies abuse and feels safe at home.    Drinks 1 cup of coffee and green tea a day       Current Outpatient Medications:  .  Azelastine-Fluticasone 137-50 MCG/ACT SUSP, Place 2 sprays into the nose daily., Disp: 23 g, Rfl: 6 .  buPROPion (WELLBUTRIN XL) 150 MG 24 hr tablet, Take 1 tablet (150 mg total) by mouth daily., Disp: 90 tablet, Rfl: 1 .  carbamazepine (CARBATROL) 100 MG 12 hr capsule, 100 mg., Disp: , Rfl: 3 .  cetirizine (KLS ALLER-TEC) 10 MG tablet, Take 10 mg by mouth daily., Disp: , Rfl:  .  cyclobenzaprine (FLEXERIL) 10 MG tablet, Take 1 tablet (10 mg total) by mouth 3 (three) times daily as needed for muscle spasms., Disp: 30 tablet, Rfl: 0 .  Ferrous Sulfate (IRON) 325 (65 Fe) MG TABS, Take 1 tablet (325 mg total) by mouth 2 (two) times daily with a meal., Disp: 30 each, Rfl: 0 .  HYDROcodone-acetaminophen (NORCO/VICODIN) 5-325 MG tablet, 5-325 tablets., Disp: , Rfl:  .  Multiple Vitamins-Minerals (MULTIVITAMIN ADULT PO), Take by mouth., Disp: , Rfl:   Allergies  Allergen Reactions  . Other     Seasonal, dogs     ROS See HPI  Objective  Vitals:   03/21/17 1130  BP: 124/88  Pulse: (!) 104  Resp: 16  Temp: 98.7 F (37.1 C)  TempSrc: Oral  SpO2: 97%  Weight: 120 lb 9.6 oz (54.7 kg)  Height: 5\' 6"  (1.676 m)    Body mass index is 19.47 kg/m.  Physical Exam Vital signs reviewed. Constitutional: Patient appears well-developed and well-nourished. No distress.  HENT: Head: Normocephalic and atraumatic. Ears: Bilateral TMs without erythema or effusion; no frontal or maxillary tenderness. Nose: Nose normal. Mouth/Throat: Oropharynx is clear and moist. Eyes: Conjunctivae and EOM are normal. Pupils are equal, round, and reactive to light. No scleral icterus.  Neck: Normal range of motion. Neck supple. No cervical adenopathy. Cardiovascular: Normal rate, regular rhythm and normal heart sounds. No BLE edema. Distal pulses intact. Pulmonary/Chest: Effort normal and breath sounds normal. No respiratory distress. Musculoskeletal: Normal range of motion,  no joint effusions. No gross deformities Neurological: She is alert and oriented to person, place, and time. No cranial nerve deficit. Coordination, balance, strength, speech and gait are normal.  Skin: Skin is warm and dry. No rash noted. No erythema.  Psychiatric: Patient has a normal mood and affect. behavior is normal. Judgment and thought content normal.   Assessment & Plan RTC in 3 months for F/U, CPE if well  Arthralgia, unspecified joint - Rheumatoid Factor; Future We discussed side effects of using muscle relaxer including drowsiness and not administering the muscle relaxer at the same time as the tegretrol to avoid increasing risks of adverse effects;  she was instructed to continue muscle relaxer prn at lowest dosage for shortest duration of time necessary to provide relief.  Weight loss Continue F/U with GI for further workup of weight loss

## 2017-03-21 NOTE — Patient Instructions (Addendum)
Please head downstairs for lab work.  It is okay to continue your muscle relaxer as needed only. Please be aware that when you take this medication it can cause drowsiness. The tegretol can also cause drowsiness. Do not take these 2 medications together. Do not operate machinery or drink alcohol when you take these medications.  Please follow up with your neurologist for further questions about MS, and orthopedics to help [place] with your shoulder pain.  For your ear popping and pressure, I have sent a prescription for flonase 2 sprays into each nostril daily until your symptoms improve, then you can decrease to 1 spray into each nostril daily. You can also take an over the counter claritin or zyrtec once daily. Please follow up If you develop fevers over 101, worsening symptoms or symptoms dont improve with flonase.  Please return in about 3 months for follow up, we can do your annual physical if you are feeling well that day.   Selective Immunoglobulin A Deficiency Selective immunoglobulin A deficiency means that you either do not have any or do not have enough immunoglobulin A (IgA). IgA is found in your blood and in other areas of the body, such as your lungs, sinuses, digestive tract, and skin. IgA is a type of protein that your body needs to fight off infections (antibody). There are four other types of antibodies: IgE, IgD, IgM, and IgG. Selective IgA deficiency is the most common type of antibody deficiency. It can occur at any age. What are the causes? Usually, the cause of this condition is not known. However, possible causes include:  A genetic defect (mutation) passed down through families.  Viral infections, such as rubella and cytomegalovirus.  Certain medicines, including aspirin, ibuprofen, and drugs used to treat arthritis, high blood pressure, seizures, thyroid disease, and malaria.  What increases the risk? This condition is more likely to develop in:  People who are  taking a medicine that can cause IgA deficiency.  People who have a family history of IgA deficiency.  What are the signs or symptoms? This condition on its own may not cause any symptoms. However, this condition can cause you to get more frequent or long-lasting (chronic) infections, such as:  Pneumonia.  Upper respiratory infections.  Ear infections.  Sinusitis.  Eye infections.  Skin infections.  This condition can also make you more likely to:  Develop a disease in which your body's defense system (immune system) attacks the normal tissues of your body (autoimmune disease). This causes inflammation. Autoimmune diseases may also cause swollen joints, skin rash, diarrhea, and belly pain.  Have allergies. An allergy may cause nasal congestion, nasal polyps, and asthma.  Have a type of very severe allergic reaction (anaphylaxis) if you get a blood transfusion that contains IgA antibodies or if you are given a transfusion of antibodies (immune globulins). This is rare. If it does occur, it may cause swelling, difficulty breathing, and very low blood pressure.  Develop chronic gastrointestinal conditions, such as inflammatory bowel disease.  How is this diagnosed? This condition is diagnosed based on your symptoms, your medical history, and a physical exam. You may also have a blood test to confirm the diagnosis. Other blood tests may be done to measure the levels of your other antibodies or to look for signs of infection. You may also have imaging studies of your lungs or sinuses. How is this treated? Treatment may include:  Taking antibiotic medicines to fight or prevent infections.  Stopping the use of  any medicines that might be causing IgA deficiency.  Treating your allergies.  Treating an autoimmune disease that you may develop.  If your condition is not causing any other diseases, allergies, or infections to develop, you may not need treatment. Follow these  instructions at home:  Take over-the-counter and prescription medicines only as told by your health care provider.  Wear a medical ID bracelet to alert health care providers about the possibility of an anaphylactic reaction from a transfusion.  Ask your health care provider if you should follow a diet for related gastrointestinal symptoms or conditions.  Do not drink water from a source that may be unclean. Unclean water can cause an infection.  Do not use any tobacco products, such as cigarettes, chewing tobacco, and e-cigarettes. If you need help quitting, ask your health care provider. Contact a health care provider if:  You have a fever.  You have congestion that is getting worse.  You develop a cough or sore throat.  You have diarrhea. Get help right away if:  You have hives.  You have wheezing or shortness of breath. This information is not intended to replace advice given to you by your health care provider. Make sure you discuss any questions you have with your health care provider. Document Released: 01/17/2015 Document Revised: 08/25/2015 Document Reviewed: 01/16/2014 Elsevier Interactive Patient Education  [employer]2018 [employer]

## 2017-03-22 LAB — RHEUMATOID FACTOR: Rhuematoid fact SerPl-aCnc: <14 IU/mL (ref <14)

## 2017-03-24 ENCOUNTER — Encounter: Payer: Self-pay | Admitting: Gastroenterology

## 2017-03-27 DIAGNOSIS — D802 Selective deficiency of immunoglobulin A [IgA]: Secondary | ICD-10-CM | POA: Insufficient documentation

## 2017-03-27 DIAGNOSIS — J3089 Other allergic rhinitis: Secondary | ICD-10-CM | POA: Insufficient documentation

## 2017-03-29 DIAGNOSIS — G500 Trigeminal neuralgia: Secondary | ICD-10-CM | POA: Diagnosis not present

## 2017-03-29 DIAGNOSIS — H9319 Tinnitus, unspecified ear: Secondary | ICD-10-CM | POA: Diagnosis not present

## 2017-03-29 DIAGNOSIS — H532 Diplopia: Secondary | ICD-10-CM | POA: Diagnosis not present

## 2017-03-30 ENCOUNTER — Other Ambulatory Visit: Payer: Self-pay | Admitting: Nurse Practitioner

## 2017-03-30 ENCOUNTER — Encounter: Payer: Self-pay | Admitting: Nurse Practitioner

## 2017-03-30 DIAGNOSIS — D802 Selective deficiency of immunoglobulin A [IgA]: Secondary | ICD-10-CM

## 2017-04-03 ENCOUNTER — Encounter: Payer: Self-pay | Admitting: Nurse Practitioner

## 2017-04-03 NOTE — Assessment & Plan Note (Signed)
IgA diagnosis discussed including return precautions-See AVS for additional information provided to patient Continue F/U with GI for weight loss She will F/U with her neurologist for MS evaluation CRP, ESR WNL 01/18/17 Will add RF today and F/U with further recommendations pending lab results

## 2017-04-03 NOTE — Assessment & Plan Note (Signed)
Will treat ear pressure with flonase and OTC antihistamines  Discussed management of allergy symptoms at home as well as return precautions- See AVS for additional information provided to patient Due to new diagnosis of Iga deficiency we will consider referral to allergist for continued allergy complaints - fluticasone (FLONASE) 50 MCG/ACT nasal spray; Place 2 sprays into both nostrils daily.  Dispense: 16 g; Refill: 6

## 2017-04-04 ENCOUNTER — Encounter: Payer: Self-pay | Admitting: Gastroenterology

## 2017-04-04 ENCOUNTER — Telehealth: Payer: Self-pay | Admitting: Nurse Practitioner

## 2017-04-04 ENCOUNTER — Other Ambulatory Visit: Payer: Self-pay | Admitting: Nurse Practitioner

## 2017-04-04 DIAGNOSIS — D802 Selective deficiency of immunoglobulin A [IgA]: Secondary | ICD-10-CM

## 2017-04-04 NOTE — Telephone Encounter (Signed)
Can you please let her know that rheumatology does not see patients for IgA deficiency so I have placed a different referral to allergist...hopefully they will be able to help us.

## 2017-04-07 NOTE — Addendum Note (Signed)
Addended by: Eustace MooreMURRAY, Liany Mumpower W on: 04/07/2017 09:33 AM   Modules accepted: Orders

## 2017-04-07 NOTE — Telephone Encounter (Signed)
Pt aware of referral to allergist. Pt did ask if it would be a good idea to make a referral to an immunologist for her IgA? She just wants to make sure she sees someone who is familiar with IgA so it is properly addressed. Please advise. Thank you

## 2017-04-07 NOTE — Telephone Encounter (Signed)
I did the referral as she requested;

## 2017-04-07 NOTE — Telephone Encounter (Signed)
Pt aware.

## 2017-04-12 ENCOUNTER — Other Ambulatory Visit: Payer: Self-pay

## 2017-04-12 ENCOUNTER — Ambulatory Visit (AMBULATORY_SURGERY_CENTER): Payer: BLUE CROSS/BLUE SHIELD | Admitting: Gastroenterology

## 2017-04-12 ENCOUNTER — Encounter: Payer: Self-pay | Admitting: Gastroenterology

## 2017-04-12 VITALS — BP 124/75 | HR 72 | Resp 18 | Ht 66.0 in | Wt 127.0 lb

## 2017-04-12 DIAGNOSIS — R112 Nausea with vomiting, unspecified: Secondary | ICD-10-CM | POA: Diagnosis not present

## 2017-04-12 DIAGNOSIS — R634 Abnormal weight loss: Secondary | ICD-10-CM

## 2017-04-12 MED ORDER — SODIUM CHLORIDE 0.9 % IV SOLN: 500.0000 mL | Freq: Once | INTRAVENOUS | Status: DC

## 2017-04-12 NOTE — Progress Notes (Signed)
I have reviewed the patient's medical history in detail and updated the computerized patient record.

## 2017-04-12 NOTE — Patient Instructions (Signed)
YOU HAD AN ENDOSCOPIC PROCEDURE TODAY AT THE [facility]:   Refer to the procedure report that was given to you for any specific questions about what was found during the examination.  If the procedure report does not answer your questions, please call your gastroenterologist to clarify.  If you requested that your care partner not be given the details of your procedure findings, then the procedure report has been included in a sealed envelope for you to review at your convenience later.  YOU SHOULD EXPECT: Some feelings of bloating in the abdomen. Passage of more gas than usual.  Walking can help get rid of the air that was put into your GI tract during the procedure and reduce the bloating. If you had a lower endoscopy (such as a colonoscopy or flexible sigmoidoscopy) you may notice spotting of blood in your stool or on the toilet paper. If you underwent a bowel prep for your procedure, you may not have a normal bowel movement for a few days.  Please Note:  You might notice some irritation and congestion in your nose or some drainage.  This is from the oxygen used during your procedure.  There is no need for concern and it should clear up in a day or so.  SYMPTOMS TO REPORT IMMEDIATELY:    Following upper endoscopy (EGD)  Vomiting of blood or coffee ground material  New chest pain or pain under the shoulder blades  Painful or persistently difficult swallowing  New shortness of breath  Fever of 100F or higher  Black, tarry-looking stools  For urgent or emergent issues, a gastroenterologist can be reached at any hour by calling [phone].   DIET:  We do recommend a small meal at first, but then you may proceed to your regular diet.  Drink plenty of fluids but you should avoid alcoholic beverages for 24 hours.  ACTIVITY:  You should plan to take it easy for the rest of today and you should NOT DRIVE or use heavy machinery until tomorrow (because of the sedation medicines used  during the test).    FOLLOW UP: Our staff will call the number listed on your records the next business day following your procedure to check on you and address any questions or concerns that you may have regarding the information given to you following your procedure. If we do not reach you, we will leave a message.  However, if you are feeling well and you are not experiencing any problems, there is no need to return our call.  We will assume that you have returned to your regular daily activities without incident.  If any biopsies were taken you will be contacted by phone or by letter within the next 1-3 weeks.  Please call us at [phone] if you have not heard about the biopsies in 3 weeks.    SIGNATURES/CONFIDENTIALITY: You and/or your care partner have signed paperwork which will be entered into your electronic medical record.  These signatures attest to the fact that that the information above on your After Visit Summary has been reviewed and is understood.  Full responsibility of the confidentiality of this discharge information lies with you and/or your care-partner.     You may resume your current medications today.  Please call if any questions or concerns.

## 2017-04-12 NOTE — Progress Notes (Signed)
No problems noted in the recovery room. maw 

## 2017-04-12 NOTE — Op Note (Signed)
[facility] Patient Name: Beth Rivera Procedure Date: 04/12/2017 9:54 AM MRN: [id number] Endoscopist: Meryl Dare , MD Age: 47 Referring MD:  Date of Birth: 1970/03/22 Gender: Female Account #: [id number] Procedure:                Upper GI endoscopy Indications:              Nausea with vomiting, Weight loss Medicines:                Monitored Anesthesia Care Procedure:                Pre-Anesthesia Assessment:                           - Prior to the procedure, a History and Physical                            was performed, and patient medications and                            allergies were reviewed. The patient's tolerance of                            previous anesthesia was also reviewed. The risks                            and benefits of the procedure and the sedation                            options and risks were discussed with the patient.                            All questions were answered, and informed consent                            was obtained. Prior Anticoagulants: The patient has                            taken no previous anticoagulant or antiplatelet                            agents. ASA Grade Assessment: II - A patient with                            mild systemic disease. After reviewing the risks                            and benefits, the patient was deemed in                            satisfactory condition to undergo the procedure.                           After obtaining informed consent, the endoscope was  passed under direct vision. Throughout the                            procedure, the patient's blood pressure, pulse, and                            oxygen saturations were monitored continuously. The                            Endoscope was introduced through the mouth, and                            advanced to the second part of duodenum. The upper                            GI endoscopy was  accomplished without difficulty.                            The patient tolerated the procedure well. The                            retroflexed photo of the cardia and fundus did not                            capture. Scope In: Scope Out: Findings:                 The esophagus was normal.                           The stomach was normal.                           The examined duodenum was normal.                           The cardia and gastric fundus were normal on                            retroflexion. Complications:            No immediate complications. Estimated Blood Loss:     Estimated blood loss: none. Impression:               - Normal esophagus.                           - Normal stomach.                           - Normal examined duodenum.                           - No specimens collected. Recommendation:           - Patient has a contact number available for  emergencies. The signs and symptoms of potential                            delayed complications were discussed with the                            patient. Return to normal activities tomorrow.                            Written discharge instructions were provided to the                            patient.                           - Resume previous diet.                           - Continue present medications. Meryl Dare, MD 04/12/2017 10:12:24 AM This report has been signed electronically.

## 2017-04-12 NOTE — Progress Notes (Signed)
Report given to PACU, vss 

## 2017-04-13 ENCOUNTER — Telehealth: Payer: Self-pay | Admitting: *Deleted

## 2017-04-13 ENCOUNTER — Telehealth: Payer: Self-pay

## 2017-04-13 DIAGNOSIS — G500 Trigeminal neuralgia: Secondary | ICD-10-CM | POA: Diagnosis not present

## 2017-04-13 NOTE — Telephone Encounter (Signed)
Called [phone] and left a messaged we tried to reach pt for a follow up call. maw

## 2017-04-13 NOTE — Telephone Encounter (Signed)
No answer. Name identifier. Message left to call if any questions.

## 2017-04-18 ENCOUNTER — Encounter: Payer: BLUE CROSS/BLUE SHIELD | Attending: Nurse Practitioner | Admitting: Dietician

## 2017-04-19 ENCOUNTER — Encounter: Payer: Self-pay | Admitting: Allergy and Immunology

## 2017-04-25 ENCOUNTER — Encounter: Payer: Self-pay | Admitting: Nurse Practitioner

## 2017-04-25 ENCOUNTER — Other Ambulatory Visit: Payer: Self-pay | Admitting: Nurse Practitioner

## 2017-04-25 DIAGNOSIS — M2550 Pain in unspecified joint: Secondary | ICD-10-CM

## 2017-04-25 DIAGNOSIS — L989 Disorder of the skin and subcutaneous tissue, unspecified: Secondary | ICD-10-CM

## 2017-04-25 DIAGNOSIS — J309 Allergic rhinitis, unspecified: Secondary | ICD-10-CM

## 2017-04-25 DIAGNOSIS — D802 Selective deficiency of immunoglobulin A [IgA]: Secondary | ICD-10-CM

## 2017-05-02 DIAGNOSIS — J320 Chronic maxillary sinusitis: Secondary | ICD-10-CM | POA: Diagnosis not present

## 2017-05-02 DIAGNOSIS — J301 Allergic rhinitis due to pollen: Secondary | ICD-10-CM | POA: Diagnosis not present

## 2017-05-02 DIAGNOSIS — J3081 Allergic rhinitis due to animal (cat) (dog) hair and dander: Secondary | ICD-10-CM | POA: Diagnosis not present

## 2017-05-16 ENCOUNTER — Ambulatory Visit: Payer: BLUE CROSS/BLUE SHIELD | Admitting: Dietician

## 2017-05-17 ENCOUNTER — Telehealth: Payer: Self-pay | Admitting: Neurology

## 2017-05-17 NOTE — Telephone Encounter (Signed)
I spoke to the patient and I informed her that I have not heard of a "[facility]a" MRI and she verbalized understanding.Marland Kitchen

## 2017-05-17 NOTE — Telephone Encounter (Signed)
Pt returned my call. She asked if we offered "Fiesta" MRIs. I told her that to my knowledge, we do not, but that I will check with our MRI coordinator, and if this is incorrect, she will call the pt to discuss. Pt verbalized understanding.

## 2017-05-17 NOTE — Telephone Encounter (Signed)
Pt would like to know if Dr Frances Furbish has access to [facility]IE[facility] MRI's please call

## 2017-05-17 NOTE — Telephone Encounter (Signed)
I called pt to discuss. No answer, left a message asking her to call me back.  I do not see any MRI results on pt's chart. Pt also has not been seen in over one year. If pt would like [place] to discuss a new problem with her, she would need an office visit with Dr. Frances Furbish. If pt calls back, I will discuss this with her.

## 2017-05-23 ENCOUNTER — Ambulatory Visit: Payer: BLUE CROSS/BLUE SHIELD | Admitting: Pediatrics

## 2017-05-23 DIAGNOSIS — G500 Trigeminal neuralgia: Secondary | ICD-10-CM | POA: Diagnosis not present

## 2017-05-31 ENCOUNTER — Ambulatory Visit: Payer: BLUE CROSS/BLUE SHIELD | Admitting: Allergy and Immunology

## 2017-06-28 ENCOUNTER — Encounter: Payer: Self-pay | Admitting: Pediatrics

## 2017-06-28 ENCOUNTER — Ambulatory Visit: Payer: BLUE CROSS/BLUE SHIELD | Admitting: Pediatrics

## 2017-06-28 VITALS — BP 106/68 | HR 91 | Temp 98.8°F | Resp 16 | Ht 65.04 in | Wt 118.0 lb

## 2017-06-28 DIAGNOSIS — R634 Abnormal weight loss: Secondary | ICD-10-CM

## 2017-06-28 DIAGNOSIS — J4520 Mild intermittent asthma, uncomplicated: Secondary | ICD-10-CM

## 2017-06-28 DIAGNOSIS — D849 Immunodeficiency, unspecified: Secondary | ICD-10-CM | POA: Insufficient documentation

## 2017-06-28 DIAGNOSIS — J3089 Other allergic rhinitis: Secondary | ICD-10-CM | POA: Diagnosis not present

## 2017-06-28 DIAGNOSIS — J45909 Unspecified asthma, uncomplicated: Secondary | ICD-10-CM | POA: Insufficient documentation

## 2017-06-28 DIAGNOSIS — J449 Chronic obstructive pulmonary disease, unspecified: Secondary | ICD-10-CM | POA: Insufficient documentation

## 2017-06-28 DIAGNOSIS — L409 Psoriasis, unspecified: Secondary | ICD-10-CM | POA: Diagnosis not present

## 2017-06-28 DIAGNOSIS — D802 Selective deficiency of immunoglobulin A [IgA]: Secondary | ICD-10-CM | POA: Diagnosis not present

## 2017-06-28 MED ORDER — MONTELUKAST SODIUM 10 MG PO TABS: ORAL_TABLET | ORAL | 5 refills | Status: DC

## 2017-06-28 MED ORDER — ALBUTEROL SULFATE HFA 108 (90 BASE) MCG/ACT IN AERS: INHALATION_SPRAY | RESPIRATORY_TRACT | 1 refills | Status: DC

## 2017-06-28 MED ORDER — TRIAMCINOLONE ACETONIDE 0.1 % EX CREA: TOPICAL_CREAM | CUTANEOUS | 3 refills | Status: DC

## 2017-06-28 MED ORDER — FLUTICASONE PROPIONATE 50 MCG/ACT NA SUSP: NASAL | 5 refills | Status: DC

## 2017-06-28 NOTE — Progress Notes (Signed)
[address] [place] [address] Dept: [phone]  New Patient Note  Patient ID: Beth Rivera, female    DOB: 1970/04/08  Age: 47 y.o. MRN: [id number] Date of Office Visit: 06/28/2017 Referring provider: Keturah Barre, MD [address] [place], [place] [address]    Chief Complaint: Sinus Problem (PND, ear ache and sinus headache and cough ( yellow mucus)) and Asthma (exercise induced asthma)  HPI Beth Rivera presents for evaluation of postnasal drainage and fullness in the ear for several months.  She has had a chronic cough and some coughing spells with exercise.  She has a history of chronic bronchitis from 20 94-2002.  She has had 2 or 3 sinus infections per year She was allergic to cow's milk until age 58.  She has an IgA deficiency She has had 3 pneumonias in the past.  Her last pneumonia was in 2012.  She had negative blood work for celiac disease but she is IgA deficient so this is inconclusive.  She had a normal upper endoscopy recently.  As an adult she has developed an itchy rash the back of her elbows.  She is currently on iron for iron deficiency anemia.  She has lost 40 pounds in the past 1-1/2 years and  complains of nausea and bloating.  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    She has a history of psoriasis.   Review of Systems  Constitutional:       40 pound weight loss in the past year  HENT:       2-3 sinus infections per year for several years.  Circumferential thickening of the left maxillary sinus.   biopsy for a left  laryngeal  laryngeal issue 2012 and 2013  Eyes: Negative.   Respiratory:       Chronic bronchitis from 1994-2002.  Frequent coughing spells  Cardiovascular: Negative.   Gastrointestinal:       Nausea and bloating for about a year.  Normal upper endoscopy recently.  No biopsy done for .celiac disease.  IgA deficiency with a negative IgA antibody for celiac disease  Genitourinary: Negative.   Musculoskeletal:   Arthritis of her hands and neck  Skin:       Psoriasis as an adult  Neurological:       Trigeminal neuralgia  Endo/Heme/Allergies:       No diabetes or thyroid disease.  Iron deficiency anemia.  IgA deficiency  Psychiatric/Behavioral: Negative.     Outpatient Encounter Medications as of 06/28/2017  Medication Sig  . cetirizine (KLS ALLER-TEC) 10 MG tablet Take 10 mg by mouth daily.  . cyclobenzaprine (FLEXERIL) 10 MG tablet Take 1 tablet (10 mg total) by mouth 3 (three) times daily as needed for muscle spasms.  . DULoxetine (CYMBALTA) 30 MG capsule TK 1 C PO D FOR 7 DAYS THEN TK 2 CS PO D  . Ferrous Sulfate (IRON) 325 (65 Fe) MG TABS Take 1 tablet (325 mg total) by mouth 2 (two) times daily with a meal.  . fluticasone (FLONASE) 50 MCG/ACT nasal spray Place 2 sprays into both nostrils daily.  . Magnesium Gluconate 550 MG TABS Take by mouth.  . Multiple Vitamins-Minerals (MULTIVITAMIN ADULT PO) Take by mouth.  Marland Kitchen albuterol (PROAIR HFA) 108 (90 Base) MCG/ACT inhaler 2 puffs every 4 hours if needed for wheezing or coughing  . carbamazepine (CARBATROL) 100 MG 12 hr capsule 100 mg.  . fluticasone (FLONASE) 50 MCG/ACT nasal spray One spray per nostril twice a day  .  HYDROcodone-acetaminophen (NORCO/VICODIN) 5-325 MG tablet 5-325 tablets.  . montelukast (SINGULAIR) 10 MG tablet One tablet once a day to prevent coughing or wheezing  . triamcinolone cream (KENALOG) 0.1 % Apply twice a day if needed to red itchy areas below face  . [DISCONTINUED] Azelastine-Fluticasone 137-50 MCG/ACT SUSP Place 2 sprays into the nose daily.  . [DISCONTINUED] buPROPion (WELLBUTRIN XL) 150 MG 24 hr tablet Take 1 tablet (150 mg total) by mouth daily.   Facility-Administered Encounter Medications as of 06/28/2017  Medication  . 0.9 %  sodium chloride infusion     Drug Allergies:  Allergies  Allergen Reactions  . Other     Seasonal, dogs    Family History: Vonnetta's family history includes Diabetes in her  father, paternal grandfather, and paternal grandmother; Heart disease in her paternal grandfather and paternal grandmother; Hepatitis C in her mother; Hernia in her father; Other in her father, mother, and paternal aunt; Thyroid disease in her maternal aunt, maternal aunt, and mother..  Family history is positive for asthma, hay fever, sinus problems eczema urticaria.  Family history is negative for immunoglobulin deficiencies including IgA, food allergies, chronic bronchitis or emphysema.  Social and environmental.  She has a dog in the home.  She is not exposed to cigarette smoking.  She has never smoked cigarettes in the past.  She is a homemaker.    Physical Exam: BP 106/68   Pulse 91   Temp 98.8 F (37.1 C) (Oral)   Resp 16   Ht 5' 5.04" (1.652 m)   Wt 118 lb (53.5 kg)   SpO2 96%   BMI 19.61 kg/m    Physical Exam  Constitutional: She is oriented to person, place, and time. She appears well-developed and well-nourished.  HENT:  Eyes normal.  Ears normal.  Nose mild swelling of  nasal turbinates.  Pharynx normal.  Neck: Neck supple. No thyromegaly present.  Cardiovascular:  S1-S2 normal no murmur  Pulmonary/Chest:  Clear to percussion and auscultation  Abdominal: Soft. Bowel sounds are normal. She exhibits no distension. There is no tenderness.  No hepatosplenomegaly  Lymphadenopathy:    She has no cervical adenopathy.  Neurological: She is alert and oriented to person, place, and time.  Skin:  Clear except for an erythematous papulosquamous eruption in the condyles of  both elbows  Psychiatric: She has a normal mood and affect. Her behavior is normal. Judgment and thought content normal.  Vitals reviewed.   Diagnostics: Allergy skin tests were positive to dog and some molds on intradermal testing only.  FVC 3.63 L FEV1 2.62 L.  Predicted FVC 3.75 L predicted FEV1 3.01 L.  After albuterol 2 puffs FVC 3.82 L FEV1 2.72 L the spirometry is in the normal range and there was no  significant improvement after albuterol   Assessment  Assessment and Plan: 1. Mild intermittent reactive airway disease without complication   2. Other allergic rhinitis   3. Immunodeficiency, unspecified (HCC)   4. Unexplained weight loss   5. IgA deficiency (HCC)   6. Psoriasis     Meds ordered this encounter  Medications  . montelukast (SINGULAIR) 10 MG tablet    Sig: One tablet once a day to prevent coughing or wheezing    Dispense:  30 tablet    Refill:  5  . albuterol (PROAIR HFA) 108 (90 Base) MCG/ACT inhaler    Sig: 2 puffs every 4 hours if needed for wheezing or coughing    Dispense:  1 Inhaler  Refill:  1  . fluticasone (FLONASE) 50 MCG/ACT nasal spray    Sig: One spray per nostril twice a day    Dispense:  16 g    Refill:  5  . triamcinolone cream (KENALOG) 0.1 %    Sig: Apply twice a day if needed to red itchy areas below face    Dispense:  45 g    Refill:  3    Patient Instructions  Claritin 10 mg once a day if needed for runny nose Nasal  saline irrigations at night followed by fluticasone 1 spray per nostril twice a day Negative celiac IgA does not exclude celiac disease in an IgA deficient patient Get a Medic Alert noting that you are  IgA deficient in case you need a blood transfusion An endoscopy with tissue biopsy is necessary to exclude celiac disease in your case Triamcinolone 0.1% cream twice a day if needed to red itchy areas below the face Montelukast 10 mg-take 1 tablet once a day to prevent coughing or wheezing Pro-air 2 puffs every 4 hours if needed for wheezing or  coughing spells.  You may use Pro-air 2 puffs 5 to 15 minutes before exercise Continue your other medications Call us if you are not doing well on this treatment plan I will let you know the results of your lab work No milk, cheese, butter or  ice cream for 1 week, then add several times a day.  See if you are better off milk products and then worse when you add them back.In  that  case  you may have a lactose intolerance If you have an intestinal biopsy, you need to be on wheat products to exclude celiac disease   Return in about 1 month (around 07/26/2017).   Thank you for the opportunity to care for this patient.  Please do not hesitate to contact me with questions.  Tonette Bihari, M.D.  [facility] [address] [place], [place] [address] [phone]

## 2017-06-28 NOTE — Patient Instructions (Addendum)
Claritin 10 mg once a day if needed for runny nose Nasal  saline irrigations at night followed by fluticasone 1 spray per nostril twice a day Negative celiac IgA does not exclude celiac disease in an IgA deficient patient Get a Medic Alert noting that you are  IgA deficient in case you need a blood transfusion An endoscopy with tissue biopsy is necessary to exclude celiac disease in your case Triamcinolone 0.1% cream twice a day if needed to red itchy areas below the face Montelukast 10 mg-take 1 tablet once a day to prevent coughing or wheezing Pro-air 2 puffs every 4 hours if needed for wheezing or  coughing spells.  You may use Pro-air 2 puffs 5 to 15 minutes before exercise Continue your other medications Call us if you are not doing well on this treatment plan I will let you know the results of your lab work No milk, cheese, butter or  ice cream for 1 week, then add several times a day.  See if you are better off milk products and then worse when you add them back.In  that case  you may have a lactose intolerance If you have an intestinal biopsy, you need to be on wheat products to exclude celiac disease

## 2017-06-30 ENCOUNTER — Ambulatory Visit (INDEPENDENT_AMBULATORY_CARE_PROVIDER_SITE_OTHER): Payer: BLUE CROSS/BLUE SHIELD | Admitting: Psychology

## 2017-06-30 DIAGNOSIS — F331 Major depressive disorder, recurrent, moderate: Secondary | ICD-10-CM

## 2017-07-14 ENCOUNTER — Other Ambulatory Visit (INDEPENDENT_AMBULATORY_CARE_PROVIDER_SITE_OTHER): Payer: BLUE CROSS/BLUE SHIELD

## 2017-07-14 DIAGNOSIS — D509 Iron deficiency anemia, unspecified: Secondary | ICD-10-CM

## 2017-07-14 LAB — CBC WITH DIFFERENTIAL/PLATELET
BASOS PCT: 0.8 % (ref 0.0–3.0)
Basophils Absolute: 0.1 10*3/uL (ref 0.0–0.1)
EOS PCT: 2.1 % (ref 0.0–5.0)
Eosinophils Absolute: 0.2 10*3/uL (ref 0.0–0.7)
HCT: 37 % (ref 36.0–46.0)
HEMOGLOBIN: 12.3 g/dL (ref 12.0–15.0)
LYMPHS PCT: 36.4 % (ref 12.0–46.0)
Lymphs Abs: 2.7 10*3/uL (ref 0.7–4.0)
MCHC: 33.2 g/dL (ref 30.0–36.0)
MCV: 91.1 fl (ref 78.0–100.0)
Monocytes Absolute: 0.5 10*3/uL (ref 0.1–1.0)
Monocytes Relative: 6.1 % (ref 3.0–12.0)
Neutro Abs: 4.1 10*3/uL (ref 1.4–7.7)
Neutrophils Relative %: 54.6 % (ref 43.0–77.0)
Platelets: 337 10*3/uL (ref 150.0–400.0)
RBC: 4.06 Mil/uL (ref 3.87–5.11)
RDW: 15.1 % (ref 11.5–15.5)
WBC: 7.5 10*3/uL (ref 4.0–10.5)

## 2017-07-14 LAB — FERRITIN: Ferritin: 5.8 ng/mL — ABNORMAL LOW (ref 10.0–291.0)

## 2017-07-15 ENCOUNTER — Other Ambulatory Visit: Payer: Self-pay

## 2017-07-15 DIAGNOSIS — D509 Iron deficiency anemia, unspecified: Secondary | ICD-10-CM

## 2017-07-18 ENCOUNTER — Other Ambulatory Visit: Payer: Self-pay

## 2017-07-18 DIAGNOSIS — R09A2 Foreign body sensation, throat: Secondary | ICD-10-CM

## 2017-07-18 DIAGNOSIS — R0989 Other specified symptoms and signs involving the circulatory and respiratory systems: Secondary | ICD-10-CM

## 2017-07-18 MED ORDER — CYCLOBENZAPRINE HCL 10 MG PO TABS: 10.0000 mg | ORAL_TABLET | Freq: Three times a day (TID) | ORAL | 0 refills | Status: DC | PRN

## 2017-07-20 ENCOUNTER — Ambulatory Visit: Payer: Self-pay | Admitting: Psychology

## 2017-07-20 ENCOUNTER — Ambulatory Visit (HOSPITAL_COMMUNITY)
Admission: RE | Admit: 2017-07-20 | Discharge: 2017-07-20 | Disposition: A | Discharge status: Home / Self Care | Payer: BLUE CROSS/BLUE SHIELD | Source: Ambulatory Visit | Attending: Gastroenterology | Admitting: Gastroenterology

## 2017-07-20 DIAGNOSIS — D509 Iron deficiency anemia, unspecified: Secondary | ICD-10-CM | POA: Diagnosis not present

## 2017-07-20 MED ORDER — SODIUM CHLORIDE 0.9 % IV SOLN
510.0000 mg | INTRAVENOUS | Status: DC
  Administered 2017-07-20: 510 mg via INTRAVENOUS
  Filled 2017-07-20: qty 17

## 2017-07-20 NOTE — Discharge Instructions (Signed)

## 2017-07-25 DIAGNOSIS — G500 Trigeminal neuralgia: Secondary | ICD-10-CM | POA: Diagnosis not present

## 2017-07-27 ENCOUNTER — Ambulatory Visit (HOSPITAL_COMMUNITY): Payer: BLUE CROSS/BLUE SHIELD

## 2017-07-28 DIAGNOSIS — Z1231 Encounter for screening mammogram for malignant neoplasm of breast: Secondary | ICD-10-CM | POA: Diagnosis not present

## 2017-07-28 DIAGNOSIS — Z01419 Encounter for gynecological examination (general) (routine) without abnormal findings: Secondary | ICD-10-CM | POA: Diagnosis not present

## 2017-07-28 DIAGNOSIS — Z6820 Body mass index (BMI) 20.0-20.9, adult: Secondary | ICD-10-CM | POA: Diagnosis not present

## 2017-07-28 DIAGNOSIS — Z124 Encounter for screening for malignant neoplasm of cervix: Secondary | ICD-10-CM | POA: Diagnosis not present

## 2017-07-28 LAB — HM MAMMOGRAPHY

## 2017-07-29 LAB — HM PAP SMEAR

## 2017-08-02 ENCOUNTER — Ambulatory Visit (HOSPITAL_COMMUNITY)
Admission: RE | Admit: 2017-08-02 | Discharge: 2017-08-02 | Disposition: A | Discharge status: Home / Self Care | Payer: BLUE CROSS/BLUE SHIELD | Source: Ambulatory Visit | Attending: Gastroenterology | Admitting: Gastroenterology

## 2017-08-02 DIAGNOSIS — D509 Iron deficiency anemia, unspecified: Secondary | ICD-10-CM | POA: Diagnosis not present

## 2017-08-02 MED ORDER — SODIUM CHLORIDE 0.9 % IV SOLN
510.0000 mg | INTRAVENOUS | Status: AC
  Administered 2017-08-02: 510 mg via INTRAVENOUS
  Filled 2017-08-02: qty 17

## 2017-08-03 ENCOUNTER — Ambulatory Visit (INDEPENDENT_AMBULATORY_CARE_PROVIDER_SITE_OTHER): Payer: BLUE CROSS/BLUE SHIELD | Admitting: Psychology

## 2017-08-03 DIAGNOSIS — F331 Major depressive disorder, recurrent, moderate: Secondary | ICD-10-CM

## 2017-08-17 ENCOUNTER — Ambulatory Visit (INDEPENDENT_AMBULATORY_CARE_PROVIDER_SITE_OTHER): Payer: BLUE CROSS/BLUE SHIELD | Admitting: Psychology

## 2017-08-17 DIAGNOSIS — F331 Major depressive disorder, recurrent, moderate: Secondary | ICD-10-CM | POA: Diagnosis not present

## 2017-08-29 DIAGNOSIS — Z7289 Other problems related to lifestyle: Secondary | ICD-10-CM | POA: Diagnosis not present

## 2017-08-29 DIAGNOSIS — G500 Trigeminal neuralgia: Secondary | ICD-10-CM | POA: Diagnosis not present

## 2017-08-29 DIAGNOSIS — K219 Gastro-esophageal reflux disease without esophagitis: Secondary | ICD-10-CM | POA: Diagnosis not present

## 2017-09-02 DIAGNOSIS — L400 Psoriasis vulgaris: Secondary | ICD-10-CM | POA: Diagnosis not present

## 2017-09-02 DIAGNOSIS — D225 Melanocytic nevi of trunk: Secondary | ICD-10-CM | POA: Diagnosis not present

## 2017-09-02 DIAGNOSIS — D485 Neoplasm of uncertain behavior of skin: Secondary | ICD-10-CM | POA: Diagnosis not present

## 2017-09-06 ENCOUNTER — Encounter: Payer: Self-pay | Admitting: Nurse Practitioner

## 2017-09-06 NOTE — Progress Notes (Signed)
Subjective:    Patient ID: Beth Rivera, female    DOB: 07-05-1970, 47 y.o.   MRN: [id number]  HPI The patient is here for an acute visit.  1 week ago she fell while playing tennis.  She was reaching for the ball and her arm was outstretched and she fell on the court.  She hit her right pelvic region.  She has pain, bruising and swelling in that area.  The swelling has improved this, as has the bruising.  She has a localized swelling and pain in the suprapubic region.  She is on chronic pain medication for other reasons and the only thing she has done different was shaved the area so she could apply lidocaine patches.  She feels her pain is controlled and it is improving.  She was mostly concerned about more prominent swelling and tenderness in the suprapubic region that is becoming more prominent as the remaining area swelling has improved.  She has been walking and is still playing tennis.  She does feel a cramping and increased pain when she squats and first sits down in that area.   She denies any other injuries elsewhere.  She denies lower back pain, leg pain or numbness/tingling.  Medications and allergies reviewed with patient and updated if appropriate.  Patient Active Problem List   Diagnosis Date Noted  . Reactive airway disease 06/28/2017  . Immunodeficiency, unspecified (HCC) 06/28/2017  . Unexplained weight loss 06/28/2017  . Psoriasis 06/28/2017  . 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 Other allergic rhinitis 03/27/2017  . Trigeminal neuralgia 03/11/2017  . Chronic left shoulder pain 02/03/2017  . Seasonal affective disorder (HCC) 12/11/2015  . Hypersomnia 12/11/2015  . Non-intractable vomiting with nausea 12/11/2015  . Globus pharyngeus 12/11/2015  . Cholelithiasis with chronic cholecystitis 02/02/2015    Current Outpatient Medications on File Prior to Visit  Medication Sig Dispense Refill  . albuterol (PROAIR HFA) 108 (90 Base) MCG/ACT inhaler 2 puffs every 4  hours if needed for wheezing or coughing 1 Inhaler 1  . cetirizine (KLS ALLER-TEC) 10 MG tablet Take 10 mg by mouth daily.    . cyclobenzaprine (FLEXERIL) 10 MG tablet Take 1 tablet (10 mg total) by mouth 3 (three) times daily as needed for muscle spasms. 30 tablet 0  . DULoxetine (CYMBALTA) 30 MG capsule TK 1 C PO D FOR 7 DAYS THEN TK 2 CS PO D    . Ferrous Sulfate (IRON) 325 (65 Fe) MG TABS Take 1 tablet (325 mg total) by mouth 2 (two) times daily with a meal. 30 each 0  . fluticasone (FLONASE) 50 MCG/ACT nasal spray Place 2 sprays into both nostrils daily. 16 g 6  . HYDROcodone-acetaminophen (NORCO/VICODIN) 5-325 MG tablet 5-325 tablets.    . Magnesium Gluconate 550 MG TABS Take by mouth.    . montelukast (SINGULAIR) 10 MG tablet One tablet once a day to prevent coughing or wheezing 30 tablet 5  . Multiple Vitamins-Minerals (MULTIVITAMIN ADULT PO) Take by mouth.    . triamcinolone cream (KENALOG) 0.1 % Apply twice a day if needed to red itchy areas below face 45 g 3   Current Facility-Administered Medications on File Prior to Visit  Medication Dose Route Frequency Provider Last Rate Last Dose  . 0.9 %  sodium chloride infusion  500 mL Intravenous Once Meryl Dare, MD        Past Medical History:  Diagnosis Date  . Asthma   . Chronic cholecystitis   . Complication  of anesthesia   . Cough LAST 3 WEEKS   CLEAR SPUTUM, NO FEVER   . Depression   . Eczema   . Family history of adverse reaction to anesthesia    SON HAS PONV  . Guttate psoriasis 1993  . IgA deficiency (HCC) 2019  . Neck pain   . PMS (premenstrual syndrome)   . PONV (postoperative nausea and vomiting)   . Poor sleep pattern   . Psoriasis    GUTATE, MOSTLY ON LIMBS  . Recurrent upper respiratory infection (URI)   . Shoulder pain   . Sinus mucosal thickening   . Tinnitus    BOTH EARS  . Trigeminal neuralgia 03/11/2017    Past Surgical History:  Procedure Laterality Date  . CHOLECYSTECTOMY N/A 02/03/2015    Procedure: LAPAROSCOPIC CHOLECYSTECTOMY WITH INTRAOPERATIVE CHOLANGIOGRAM;  Surgeon: Darnell Level, MD;  Location: [facility];  Service: General;  Laterality: N/A;  . WISDOM TOOTH EXTRACTION      Social History   Socioeconomic History  . Marital status: Married    Spouse name: Not on file  . Number of children: 2  . Years of education: BA  . Highest education level: Not on file  Occupational History  . Not on file  Social Needs  . Financial resource strain: Not on file  . Food insecurity:    Worry: Not on file    Inability: Not on file  . Transportation needs:    Medical: Not on file    Non-medical: Not on file  Tobacco Use  . Smoking status: Never Smoker  . Smokeless tobacco: Never Used  Substance and Sexual Activity  . Alcohol use: Yes    Alcohol/week: 2.0 - 4.0 standard drinks    Types: 2 - 4 Standard drinks or equivalent per week    Comment: OCCASIONAL  . Drug use: No  . Sexual activity: Yes    Partners: Male    Birth control/protection: Surgical    Comment: husband vasectomy   Lifestyle  . Physical activity:    Days per week: Not on file    Minutes per session: Not on file  . Stress: Not on file  Relationships  . Social connections:    Talks on phone: Not on file    Gets together: Not on file    Attends religious service: Not on file    Active member of club or organization: Not on file    Attends meetings of clubs or organizations: Not on file    Relationship status: Not on file  Other Topics Concern  . Not on file  Social History Narrative   Fun: Biking, hiking and gardening.   Denies abuse and feels safe at home.    Drinks 1 cup of coffee and green tea a day     Family History  Problem Relation Age of Onset  . Heart disease Paternal Grandfather   . Diabetes Paternal Grandfather   . Heart disease Paternal Grandmother   . Diabetes Paternal Grandmother   . Hernia Father   . Diabetes Father   . Other Father        joint problems  . Thyroid disease  Mother   . Other Mother        blood transfusion  . Hepatitis C Mother        contracted from blood transfusion  . Thyroid disease Maternal Aunt   . Thyroid disease Maternal Aunt   . Other Paternal Aunt        joint  problems    Review of Systems  Constitutional: Negative for fatigue and fever.  Genitourinary: Positive for pelvic pain (Swelling right suprapubic region with tenderness).  Musculoskeletal: Negative for back pain.       No lower back pain from the fall, no leg pain  Skin: Positive for color change (Bruising right lower pelvic region).  Neurological: Negative for weakness and numbness.       Objective:   Vitals:   09/07/17 1600  BP: 126/70  Pulse: 77  Resp: 16  Temp: 99.1 F (37.3 C)  SpO2: 97%   BP Readings from Last 3 Encounters:  09/07/17 126/70  08/02/17 116/82  07/20/17 108/69   Wt Readings from Last 3 Encounters:  09/07/17 130 lb (59 kg)  08/02/17 125 lb (56.7 kg)  07/20/17 120 lb (54.4 kg)   Body mass index is 20.98 kg/m.   Physical Exam  Constitutional: She appears well-developed and well-nourished. No distress.  HENT:  Head: Normocephalic and atraumatic.  Abdominal: There is tenderness (Right lower pelvic area has ecchymosis that is slightly tender to palpation.  Right suprapubic region with swelling that is tender-no overlying ecchymosis).  Musculoskeletal:  No right iliac crest or groin tenderness with palpation  Skin: Skin is warm and dry. She is not diaphoretic.           Assessment & Plan:    See Problem List for Assessment and Plan of chronic medical problems.

## 2017-09-07 ENCOUNTER — Other Ambulatory Visit: Payer: Self-pay | Admitting: Internal Medicine

## 2017-09-07 ENCOUNTER — Other Ambulatory Visit: Payer: BLUE CROSS/BLUE SHIELD

## 2017-09-07 ENCOUNTER — Encounter: Payer: Self-pay | Admitting: Internal Medicine

## 2017-09-07 ENCOUNTER — Ambulatory Visit: Payer: BLUE CROSS/BLUE SHIELD | Admitting: Internal Medicine

## 2017-09-07 ENCOUNTER — Ambulatory Visit (INDEPENDENT_AMBULATORY_CARE_PROVIDER_SITE_OTHER)
Admission: RE | Admit: 2017-09-07 | Discharge: 2017-09-07 | Disposition: A | Discharge status: Home / Self Care | Payer: BLUE CROSS/BLUE SHIELD | Source: Ambulatory Visit | Attending: Internal Medicine | Admitting: Internal Medicine

## 2017-09-07 VITALS — BP 126/70 | HR 77 | Temp 99.1°F | Resp 16 | Ht 66.0 in | Wt 130.0 lb

## 2017-09-07 DIAGNOSIS — R102 Pelvic and perineal pain: Secondary | ICD-10-CM

## 2017-09-07 DIAGNOSIS — R1903 Right lower quadrant abdominal swelling, mass and lump: Secondary | ICD-10-CM | POA: Diagnosis not present

## 2017-09-07 DIAGNOSIS — L400 Psoriasis vulgaris: Secondary | ICD-10-CM | POA: Diagnosis not present

## 2017-09-07 DIAGNOSIS — S3993XA Unspecified injury of pelvis, initial encounter: Secondary | ICD-10-CM | POA: Diagnosis not present

## 2017-09-07 NOTE — Patient Instructions (Signed)
Have an xray done today.   An Ultrasound was ordered - someone will call you to schedule this.    Call if no improvement

## 2017-09-07 NOTE — Assessment & Plan Note (Signed)
Treatment as above 

## 2017-09-07 NOTE — Assessment & Plan Note (Signed)
She fell 1 week ago while playing tennis and landed on her right pelvic region She has localized swelling in the right suprapubic region and ecchymosis in the right lower pelvis The area is painful, but the pain is currently managed and improving Continue current pain regimen Given the swelling in the suprapubic region and tenderness on exam will get a pelvic x-ray to rule out fracture, which is unlikely We will also obtain an ultrasound of the right suprapubic region to rule out hematoma or hernia

## 2017-09-09 DIAGNOSIS — L400 Psoriasis vulgaris: Secondary | ICD-10-CM | POA: Diagnosis not present

## 2017-09-12 DIAGNOSIS — L400 Psoriasis vulgaris: Secondary | ICD-10-CM | POA: Diagnosis not present

## 2017-09-13 DIAGNOSIS — G501 Atypical facial pain: Secondary | ICD-10-CM | POA: Diagnosis not present

## 2017-09-13 DIAGNOSIS — Z8669 Personal history of other diseases of the nervous system and sense organs: Secondary | ICD-10-CM | POA: Diagnosis not present

## 2017-09-13 DIAGNOSIS — R200 Anesthesia of skin: Secondary | ICD-10-CM | POA: Diagnosis not present

## 2017-09-13 DIAGNOSIS — R202 Paresthesia of skin: Secondary | ICD-10-CM | POA: Diagnosis not present

## 2017-09-13 DIAGNOSIS — Z79899 Other long term (current) drug therapy: Secondary | ICD-10-CM | POA: Diagnosis not present

## 2017-09-13 DIAGNOSIS — G500 Trigeminal neuralgia: Secondary | ICD-10-CM | POA: Diagnosis not present

## 2017-09-13 DIAGNOSIS — R51 Headache: Secondary | ICD-10-CM | POA: Diagnosis not present

## 2017-09-14 ENCOUNTER — Encounter: Payer: Self-pay | Admitting: Internal Medicine

## 2017-09-14 DIAGNOSIS — L400 Psoriasis vulgaris: Secondary | ICD-10-CM | POA: Diagnosis not present

## 2017-09-16 DIAGNOSIS — L400 Psoriasis vulgaris: Secondary | ICD-10-CM | POA: Diagnosis not present

## 2017-09-19 DIAGNOSIS — L400 Psoriasis vulgaris: Secondary | ICD-10-CM | POA: Diagnosis not present

## 2017-09-20 ENCOUNTER — Ambulatory Visit (INDEPENDENT_AMBULATORY_CARE_PROVIDER_SITE_OTHER): Payer: BLUE CROSS/BLUE SHIELD | Admitting: Psychology

## 2017-09-20 ENCOUNTER — Other Ambulatory Visit: Payer: Self-pay

## 2017-09-20 ENCOUNTER — Other Ambulatory Visit (INDEPENDENT_AMBULATORY_CARE_PROVIDER_SITE_OTHER): Payer: BLUE CROSS/BLUE SHIELD

## 2017-09-20 DIAGNOSIS — D509 Iron deficiency anemia, unspecified: Secondary | ICD-10-CM | POA: Diagnosis not present

## 2017-09-20 DIAGNOSIS — F331 Major depressive disorder, recurrent, moderate: Secondary | ICD-10-CM | POA: Diagnosis not present

## 2017-09-20 LAB — CBC WITH DIFFERENTIAL/PLATELET
BASOS PCT: 0.9 % (ref 0.0–3.0)
Basophils Absolute: 0 10*3/uL (ref 0.0–0.1)
EOS ABS: 0.1 10*3/uL (ref 0.0–0.7)
Eosinophils Relative: 1.9 % (ref 0.0–5.0)
HEMATOCRIT: 38.7 % (ref 36.0–46.0)
Hemoglobin: 13 g/dL (ref 12.0–15.0)
LYMPHS ABS: 2.1 10*3/uL (ref 0.7–4.0)
Lymphocytes Relative: 50.3 % — ABNORMAL HIGH (ref 12.0–46.0)
MCHC: 33.7 g/dL (ref 30.0–36.0)
MCV: 92.4 fl (ref 78.0–100.0)
Monocytes Absolute: 0.3 10*3/uL (ref 0.1–1.0)
Monocytes Relative: 7.6 % (ref 3.0–12.0)
NEUTROS ABS: 1.6 10*3/uL (ref 1.4–7.7)
NEUTROS PCT: 39.3 % — AB (ref 43.0–77.0)
PLATELETS: 306 10*3/uL (ref 150.0–400.0)
RBC: 4.18 Mil/uL (ref 3.87–5.11)
RDW: 15.2 % (ref 11.5–15.5)
WBC: 4.2 10*3/uL (ref 4.0–10.5)

## 2017-09-20 LAB — FERRITIN: Ferritin: 94.9 ng/mL (ref 10.0–291.0)

## 2017-09-21 DIAGNOSIS — L400 Psoriasis vulgaris: Secondary | ICD-10-CM | POA: Diagnosis not present

## 2017-09-28 LAB — CELIAC DISEASE HLA DQ ASSOC.
DQ2 (DQA1 0501/0505, DQB1 02XX): NEGATIVE
DQ8 (DQA1 03XX, DQB1 0302): NEGATIVE

## 2017-09-29 ENCOUNTER — Telehealth: Payer: Self-pay | Admitting: Gastroenterology

## 2017-10-04 ENCOUNTER — Ambulatory Visit (INDEPENDENT_AMBULATORY_CARE_PROVIDER_SITE_OTHER): Payer: BLUE CROSS/BLUE SHIELD | Admitting: Psychology

## 2017-10-04 DIAGNOSIS — F331 Major depressive disorder, recurrent, moderate: Secondary | ICD-10-CM

## 2017-10-18 ENCOUNTER — Ambulatory Visit: Payer: BLUE CROSS/BLUE SHIELD | Admitting: Psychology

## 2017-10-20 ENCOUNTER — Encounter: Payer: Self-pay | Admitting: Nurse Practitioner

## 2017-10-20 ENCOUNTER — Ambulatory Visit: Payer: BLUE CROSS/BLUE SHIELD | Admitting: Internal Medicine

## 2017-10-20 ENCOUNTER — Ambulatory Visit: Payer: BLUE CROSS/BLUE SHIELD | Admitting: Nurse Practitioner

## 2017-10-20 VITALS — BP 116/72 | HR 78 | Ht 66.0 in | Wt 133.0 lb

## 2017-10-20 DIAGNOSIS — M542 Cervicalgia: Secondary | ICD-10-CM | POA: Insufficient documentation

## 2017-10-20 DIAGNOSIS — G44209 Tension-type headache, unspecified, not intractable: Secondary | ICD-10-CM | POA: Diagnosis not present

## 2017-10-20 DIAGNOSIS — G500 Trigeminal neuralgia: Secondary | ICD-10-CM

## 2017-10-20 DIAGNOSIS — G5613 Other lesions of median nerve, bilateral upper limbs: Secondary | ICD-10-CM | POA: Diagnosis not present

## 2017-10-20 DIAGNOSIS — F338 Other recurrent depressive disorders: Secondary | ICD-10-CM

## 2017-10-20 DIAGNOSIS — G501 Atypical facial pain: Secondary | ICD-10-CM | POA: Diagnosis not present

## 2017-10-20 MED ORDER — BUPROPION HCL ER (XL) 150 MG PO TB24: 150.0000 mg | ORAL_TABLET | Freq: Every day | ORAL | 2 refills | Status: DC

## 2017-10-20 MED ORDER — CYCLOBENZAPRINE HCL 10 MG PO TABS: 10.0000 mg | ORAL_TABLET | Freq: Every evening | ORAL | 0 refills | Status: DC | PRN

## 2017-10-20 NOTE — Assessment & Plan Note (Signed)
Continue flexeril PRN F/U for new, worsening symptoms - cyclobenzaprine (FLEXERIL) 10 MG tablet; Take 1 tablet (10 mg total) by mouth at bedtime as needed for muscle spasms.  Dispense: 30 tablet; Refill: 0

## 2017-10-20 NOTE — Patient Instructions (Addendum)
I have sent a prescription for wellbutrin 150mg  tablets to your pharmacy. Please start 1/2 tablet once daily for 1 week and then increase to a full tablet once daily on week two as tolerated.  Some side effects such as nausea, drowsiness and weight gain can occur.  Also rarely people have experienced suicidal thoughts when taking this medication.  Please discontinue the medication and go directly to ED if this occurs.  Id like to see you back in about 1 month to evaluate progress.   We can do your annual physical that day.

## 2017-10-20 NOTE — Assessment & Plan Note (Signed)
Continue flexeril PRN Continue regular F/U with neurology as planned - cyclobenzaprine (FLEXERIL) 10 MG tablet; Take 1 tablet (10 mg total) by mouth at bedtime as needed for muscle spasms.  Dispense: 30 tablet; Refill: 0

## 2017-10-20 NOTE — Progress Notes (Signed)
Beth Rivera is a 47 y.o. female with the following history as recorded in EpicCare:  Patient Active Problem List   Diagnosis Date Noted  . Pelvic swelling, RLQ 09/07/2017  . Pelvic pain 09/07/2017  . Reactive airway disease 06/28/2017  . Immunodeficiency, unspecified (HCC) 06/28/2017  . Unexplained weight loss 06/28/2017  . Psoriasis 06/28/2017  . 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 Other allergic rhinitis 03/27/2017  . Trigeminal neuralgia 03/11/2017  . Chronic left shoulder pain 02/03/2017  . Seasonal affective disorder (HCC) 12/11/2015  . Hypersomnia 12/11/2015  . Non-intractable vomiting with nausea 12/11/2015  . Globus pharyngeus 12/11/2015  . Cholelithiasis with chronic cholecystitis 02/02/2015    Current Outpatient Medications  Medication Sig Dispense Refill  . albuterol (PROAIR HFA) 108 (90 Base) MCG/ACT inhaler 2 puffs every 4 hours if needed for wheezing or coughing 1 Inhaler 1  . cetirizine (KLS ALLER-TEC) 10 MG tablet Take 10 mg by mouth daily.    . cyclobenzaprine (FLEXERIL) 10 MG tablet Take 1 tablet (10 mg total) by mouth at bedtime as needed for muscle spasms. 30 tablet 0  . fluticasone (FLONASE) 50 MCG/ACT nasal spray Place 2 sprays into both nostrils daily. 16 g 6  . Multiple Vitamins-Minerals (MULTIVITAMIN ADULT PO) Take by mouth.    . triamcinolone cream (KENALOG) 0.1 % Apply twice a day if needed to red itchy areas below face 45 g 3  . buPROPion (WELLBUTRIN XL) 150 MG 24 hr tablet Take 1 tablet (150 mg total) by mouth daily. 30 tablet 2  . DULoxetine (CYMBALTA) 30 MG capsule TK 1 C PO D FOR 7 DAYS THEN TK 2 CS PO D    . Ferrous Sulfate (IRON) 325 (65 Fe) MG TABS Take 1 tablet (325 mg total) by mouth 2 (two) times daily with a meal. (Patient not taking: Reported on 10/20/2017) 30 each 0  . HYDROcodone-acetaminophen (NORCO/VICODIN) 5-325 MG tablet 5-325 tablets.    . Magnesium Gluconate 550 MG TABS Take by mouth.    . montelukast (SINGULAIR) 10 MG tablet One  tablet once a day to prevent coughing or wheezing (Patient not taking: Reported on 10/20/2017) 30 tablet 5   Current Facility-Administered Medications  Medication Dose Route Frequency Provider Last Rate Last Dose  . 0.9 %  sodium chloride infusion  500 mL Intravenous Once Meryl Dare, MD        Allergies: Other  Past Medical History:  Diagnosis Date  . Asthma   . Chronic cholecystitis   . Complication of anesthesia   . Cough LAST 3 WEEKS   CLEAR SPUTUM, NO FEVER   . Depression   . Eczema   . Family history of adverse reaction to anesthesia    SON HAS PONV  . Guttate psoriasis 1993  . IgA deficiency (HCC) 2019  . Neck pain   . PMS (premenstrual syndrome)   . PONV (postoperative nausea and vomiting)   . Poor sleep pattern   . Psoriasis    GUTATE, MOSTLY ON LIMBS  . Recurrent upper respiratory infection (URI)   . Shoulder pain   . Sinus mucosal thickening   . Tinnitus    BOTH EARS  . Trigeminal neuralgia 03/11/2017    Past Surgical History:  Procedure Laterality Date  . CHOLECYSTECTOMY N/A 02/03/2015   Procedure: LAPAROSCOPIC CHOLECYSTECTOMY WITH INTRAOPERATIVE CHOLANGIOGRAM;  Surgeon: Darnell Level, MD;  Location: [facility];  Service: General;  Laterality: N/A;  . WISDOM TOOTH EXTRACTION      Family History  Problem Relation Age  of Onset  . Heart disease Paternal Grandfather   . Diabetes Paternal Grandfather   . Heart disease Paternal Grandmother   . Diabetes Paternal Grandmother   . Hernia Father   . Diabetes Father   . Other Father        joint problems  . Thyroid disease Mother   . Other Mother        blood transfusion  . Hepatitis C Mother        contracted from blood transfusion  . Thyroid disease Maternal Aunt   . Thyroid disease Maternal Aunt   . Other Paternal Aunt        joint problems    Social History   Tobacco Use  . Smoking status: Never Smoker  . Smokeless tobacco: Never Used  Substance Use Topics  . Alcohol use: Yes    Alcohol/week: 2.0 -  4.0 standard drinks    Types: 2 - 4 Standard drinks or equivalent per week    Comment: OCCASIONAL     Subjective:  Ms Brougher is here today requesting refills of wellbutrin and flexeril\ Since our last OV she has established care and following regularly with neurology/neurosurgery at [facility] with plan to continue q 6 mo follow ups. She tells me that she was recently tried on cymbalta for trigeminal neuralgia , did not like it so recently weaned off. She has prescription for flexeril for chronic neck pain, takes about 2-3 nights a week which also seems to be helping her trigeminal neuralgia pain. She also sees a chiropractor regularly for her neck pain which does help. She has not noted any adverse effects of the flexeril. She has been maintained on wellbutrin 150 in the winter only for years for SAD, reports she has always tolerated the wellbutrin well and it seems to really help her mood in the winter months. She is overall feeling well today, no complaints  ROS- See HPI  Objective:  Vitals:   10/20/17 1424  BP: 116/72  Pulse: 78  SpO2: 99%  Weight: 133 lb (60.3 kg)  Height: 5\' 6"  (1.676 m)    General: Well developed, well nourished, in no acute distress  Skin : Warm and dry.  Head: Normocephalic and atraumatic  Eyes: Sclera and conjunctiva clear; pupils round and reactive to light; extraocular movements intact  Oropharynx: Pink, supple. No suspicious lesions  Neck: Supple Lungs: Respirations unlabored; clear to auscultation bilaterally without wheeze, rales, rhonchi  CVS exam: normal rate and regular rhythm, S1 and S2 normal.  Musculoskeletal: No deformities; no active joint inflammation  Extremities: No edema, cyanosis Vessels: Symmetric bilaterally  Neurologic: Alert and oriented; speech intact; face symmetrical; moves all extremities well; CNII-XII intact without focal deficit   Assessment:  1. Seasonal affective disorder (HCC)   2. Trigeminal neuralgia   3.  Neck pain     Plan:   Return in about 1 month (around 11/20/2017) for F/U-SAD-wellbutrin; CPE.  No orders of the defined types were placed in this encounter.   Requested Prescriptions   Signed Prescriptions Disp Refills  . cyclobenzaprine (FLEXERIL) 10 MG tablet 30 tablet 0    Sig: Take 1 tablet (10 mg total) by mouth at bedtime as needed for muscle spasms.  Marland Kitchen buPROPion (WELLBUTRIN XL) 150 MG 24 hr tablet 30 tablet 2    Sig: Take 1 tablet (150 mg total) by mouth daily.

## 2017-10-20 NOTE — Assessment & Plan Note (Signed)
Resume wellbutrin for the winter-dosing and side effects discussed RTC in 1 month for F/U - buPROPion (WELLBUTRIN XL) 150 MG 24 hr tablet; Take 1 tablet (150 mg total) by mouth daily.  Dispense: 30 tablet; Refill: 2

## 2017-10-26 DIAGNOSIS — G5613 Other lesions of median nerve, bilateral upper limbs: Secondary | ICD-10-CM | POA: Diagnosis not present

## 2017-10-26 DIAGNOSIS — G500 Trigeminal neuralgia: Secondary | ICD-10-CM | POA: Diagnosis not present

## 2017-10-26 DIAGNOSIS — G44209 Tension-type headache, unspecified, not intractable: Secondary | ICD-10-CM | POA: Diagnosis not present

## 2017-10-26 DIAGNOSIS — G501 Atypical facial pain: Secondary | ICD-10-CM | POA: Diagnosis not present

## 2017-10-28 DIAGNOSIS — G5613 Other lesions of median nerve, bilateral upper limbs: Secondary | ICD-10-CM | POA: Diagnosis not present

## 2017-10-28 DIAGNOSIS — G500 Trigeminal neuralgia: Secondary | ICD-10-CM | POA: Diagnosis not present

## 2017-10-28 DIAGNOSIS — G501 Atypical facial pain: Secondary | ICD-10-CM | POA: Diagnosis not present

## 2017-10-28 DIAGNOSIS — G44209 Tension-type headache, unspecified, not intractable: Secondary | ICD-10-CM | POA: Diagnosis not present

## 2017-11-01 DIAGNOSIS — G44209 Tension-type headache, unspecified, not intractable: Secondary | ICD-10-CM | POA: Diagnosis not present

## 2017-11-01 DIAGNOSIS — G5613 Other lesions of median nerve, bilateral upper limbs: Secondary | ICD-10-CM | POA: Diagnosis not present

## 2017-11-01 DIAGNOSIS — G501 Atypical facial pain: Secondary | ICD-10-CM | POA: Diagnosis not present

## 2017-11-01 DIAGNOSIS — G500 Trigeminal neuralgia: Secondary | ICD-10-CM | POA: Diagnosis not present

## 2017-11-02 ENCOUNTER — Encounter: Payer: Self-pay | Admitting: Family Medicine

## 2017-11-03 DIAGNOSIS — G5613 Other lesions of median nerve, bilateral upper limbs: Secondary | ICD-10-CM | POA: Diagnosis not present

## 2017-11-03 DIAGNOSIS — G44209 Tension-type headache, unspecified, not intractable: Secondary | ICD-10-CM | POA: Diagnosis not present

## 2017-11-03 DIAGNOSIS — G501 Atypical facial pain: Secondary | ICD-10-CM | POA: Diagnosis not present

## 2017-11-03 DIAGNOSIS — G500 Trigeminal neuralgia: Secondary | ICD-10-CM | POA: Diagnosis not present

## 2017-11-08 DIAGNOSIS — G44209 Tension-type headache, unspecified, not intractable: Secondary | ICD-10-CM | POA: Diagnosis not present

## 2017-11-08 DIAGNOSIS — G5613 Other lesions of median nerve, bilateral upper limbs: Secondary | ICD-10-CM | POA: Diagnosis not present

## 2017-11-08 DIAGNOSIS — G500 Trigeminal neuralgia: Secondary | ICD-10-CM | POA: Diagnosis not present

## 2017-11-08 DIAGNOSIS — G501 Atypical facial pain: Secondary | ICD-10-CM | POA: Diagnosis not present

## 2017-11-10 ENCOUNTER — Ambulatory Visit (INDEPENDENT_AMBULATORY_CARE_PROVIDER_SITE_OTHER): Payer: BLUE CROSS/BLUE SHIELD | Admitting: Psychology

## 2017-11-10 DIAGNOSIS — F331 Major depressive disorder, recurrent, moderate: Secondary | ICD-10-CM | POA: Diagnosis not present

## 2017-11-17 DIAGNOSIS — G5613 Other lesions of median nerve, bilateral upper limbs: Secondary | ICD-10-CM | POA: Diagnosis not present

## 2017-11-17 DIAGNOSIS — G500 Trigeminal neuralgia: Secondary | ICD-10-CM | POA: Diagnosis not present

## 2017-11-17 DIAGNOSIS — G501 Atypical facial pain: Secondary | ICD-10-CM | POA: Diagnosis not present

## 2017-11-17 DIAGNOSIS — M2550 Pain in unspecified joint: Secondary | ICD-10-CM | POA: Diagnosis not present

## 2017-11-17 DIAGNOSIS — G44209 Tension-type headache, unspecified, not intractable: Secondary | ICD-10-CM | POA: Diagnosis not present

## 2017-11-17 DIAGNOSIS — D802 Selective deficiency of immunoglobulin A [IgA]: Secondary | ICD-10-CM | POA: Diagnosis not present

## 2017-11-17 DIAGNOSIS — L404 Guttate psoriasis: Secondary | ICD-10-CM | POA: Diagnosis not present

## 2017-11-21 ENCOUNTER — Encounter: Payer: BLUE CROSS/BLUE SHIELD | Admitting: Nurse Practitioner

## 2017-11-21 DIAGNOSIS — Z0289 Encounter for other administrative examinations: Secondary | ICD-10-CM

## 2017-11-28 ENCOUNTER — Ambulatory Visit: Payer: Self-pay

## 2017-11-28 ENCOUNTER — Encounter: Payer: Self-pay | Admitting: Nurse Practitioner

## 2017-11-28 ENCOUNTER — Ambulatory Visit (INDEPENDENT_AMBULATORY_CARE_PROVIDER_SITE_OTHER): Payer: BLUE CROSS/BLUE SHIELD | Admitting: Nurse Practitioner

## 2017-11-28 VITALS — BP 120/80 | HR 104 | Ht 66.0 in | Wt 131.0 lb

## 2017-11-28 DIAGNOSIS — S138XXA Sprain of joints and ligaments of other parts of neck, initial encounter: Secondary | ICD-10-CM | POA: Diagnosis not present

## 2017-11-28 DIAGNOSIS — G44209 Tension-type headache, unspecified, not intractable: Secondary | ICD-10-CM | POA: Diagnosis not present

## 2017-11-28 DIAGNOSIS — G501 Atypical facial pain: Secondary | ICD-10-CM | POA: Diagnosis not present

## 2017-11-28 DIAGNOSIS — M542 Cervicalgia: Secondary | ICD-10-CM

## 2017-11-28 DIAGNOSIS — G5613 Other lesions of median nerve, bilateral upper limbs: Secondary | ICD-10-CM | POA: Diagnosis not present

## 2017-11-28 MED ORDER — IBUPROFEN 600 MG PO TABS: 600.0000 mg | ORAL_TABLET | Freq: Three times a day (TID) | ORAL | 0 refills | Status: DC | PRN

## 2017-11-28 NOTE — Progress Notes (Signed)
Beth Rivera is a 47 y.o. female with the following history as recorded in EpicCare:  Patient Active Problem List   Diagnosis Date Noted  . Neck pain 10/20/2017  . Pelvic swelling, RLQ 09/07/2017  . Reactive airway disease 06/28/2017  . Immunodeficiency, unspecified (HCC) 06/28/2017  . Unexplained weight loss 06/28/2017  . Psoriasis 06/28/2017  . 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 Other allergic rhinitis 03/27/2017  . Trigeminal neuralgia 03/11/2017  . Chronic left shoulder pain 02/03/2017  . Seasonal affective disorder (HCC) 12/11/2015  . Hypersomnia 12/11/2015  . Non-intractable vomiting with nausea 12/11/2015  . Globus pharyngeus 12/11/2015  . Cholelithiasis with chronic cholecystitis 02/02/2015    Current Outpatient Medications  Medication Sig Dispense Refill  . albuterol (PROAIR HFA) 108 (90 Base) MCG/ACT inhaler 2 puffs every 4 hours if needed for wheezing or coughing 1 Inhaler 1  . buPROPion (WELLBUTRIN XL) 150 MG 24 hr tablet Take 1 tablet (150 mg total) by mouth daily. 30 tablet 2  . calcipotriene (DOVONOX) 0.005 % ointment     . cetirizine (KLS ALLER-TEC) 10 MG tablet Take 10 mg by mouth daily.    . cyclobenzaprine (FLEXERIL) 10 MG tablet Take 1 tablet (10 mg total) by mouth at bedtime as needed for muscle spasms. 30 tablet 0  . Ferrous Sulfate (IRON) 325 (65 Fe) MG TABS Take 1 tablet (325 mg total) by mouth 2 (two) times daily with a meal. 30 each 0  . fluticasone (FLONASE) 50 MCG/ACT nasal spray Place 2 sprays into both nostrils daily. 16 g 6  . Magnesium Gluconate 550 MG TABS Take by mouth.    . montelukast (SINGULAIR) 10 MG tablet One tablet once a day to prevent coughing or wheezing 30 tablet 5  . Multiple Vitamins-Minerals (MULTIVITAMIN ADULT PO) Take by mouth.    . triamcinolone cream (KENALOG) 0.1 % Apply twice a day if needed to red itchy areas below face 45 g 3  . gabapentin (NEURONTIN) 300 MG capsule Take by mouth.    Marland Kitchen HYDROcodone-acetaminophen  (NORCO/VICODIN) 5-325 MG tablet 5-325 tablets.    Marland Kitchen ibuprofen (ADVIL,MOTRIN) 600 MG tablet Take 1 tablet (600 mg total) by mouth every 8 (eight) hours as needed. 30 tablet 0   Current Facility-Administered Medications  Medication Dose Route Frequency Provider Last Rate Last Dose  . 0.9 %  sodium chloride infusion  500 mL Intravenous Once Meryl Dare, MD        Allergies: Other  Past Medical History:  Diagnosis Date  . Asthma   . Chronic cholecystitis   . Complication of anesthesia   . Cough LAST 3 WEEKS   CLEAR SPUTUM, NO FEVER   . Depression   . Eczema   . Family history of adverse reaction to anesthesia    SON HAS PONV  . Guttate psoriasis 1993  . IgA deficiency (HCC) 2019  . Neck pain   . PMS (premenstrual syndrome)   . PONV (postoperative nausea and vomiting)   . Poor sleep pattern   . Psoriasis    GUTATE, MOSTLY ON LIMBS  . Recurrent upper respiratory infection (URI)   . Shoulder pain   . Sinus mucosal thickening   . Tinnitus    BOTH EARS  . Trigeminal neuralgia 03/11/2017    Past Surgical History:  Procedure Laterality Date  . CHOLECYSTECTOMY N/A 02/03/2015   Procedure: LAPAROSCOPIC CHOLECYSTECTOMY WITH INTRAOPERATIVE CHOLANGIOGRAM;  Surgeon: Darnell Level, MD;  Location: [facility];  Service: General;  Laterality: N/A;  . WISDOM TOOTH EXTRACTION  Family History  Problem Relation Age of Onset  . Heart disease Paternal Grandfather   . Diabetes Paternal Grandfather   . Heart disease Paternal Grandmother   . Diabetes Paternal Grandmother   . Hernia Father   . Diabetes Father   . Other Father        joint problems  . Thyroid disease Mother   . Other Mother        blood transfusion  . Hepatitis C Mother        contracted from blood transfusion  . Thyroid disease Maternal Aunt   . Thyroid disease Maternal Aunt   . Other Paternal Aunt        joint problems    Social History   Tobacco Use  . Smoking status: Never Smoker  . Smokeless tobacco: Never Used   Substance Use Topics  . Alcohol use: Yes    Alcohol/week: 2.0 - 4.0 standard drinks    Types: 2 - 4 Standard drinks or equivalent per week    Comment: OCCASIONAL     Subjective:  Beth Rivera is here today requesting evaluation of acute complaint of neck pain, she suffers from chronic neck pain, but last week she was hit in the left side of her head with a tennis ball and has noticed increased R sided neck pain since. Describes as tightness in right lateral, anterior neck She did note some tinnitus for about 2 days after the tennis ball injury, has resolved now She does say she thinks she was getting a "crick" in her neck prior to the tennis ball injury She has been using flexeril at night, saw chiropractor today and neck pain seems to be getting a little better now She saw her rheumatologist last week, neck xray was done and she was told she has "bone spurs and OA."  Review of Systems  Constitutional: Negative for chills and fever.  HENT: Positive for tinnitus. Negative for ear discharge, ear pain and hearing loss.   Eyes: Negative for blurred vision and double vision.  Respiratory: Negative for shortness of breath.   Cardiovascular: Negative for chest pain.  Gastrointestinal: Negative for nausea and vomiting.  Genitourinary: Negative for dysuria and hematuria.  Musculoskeletal: Negative for joint pain and myalgias.  Skin: Negative for rash.  Neurological: Negative for dizziness, tingling, loss of consciousness, weakness and headaches.  Endo/Heme/Allergies: Does not bruise/bleed easily.    Objective:  Vitals:   11/28/17 1546  BP: 120/80  Pulse: (!) 104  SpO2: 96%  Weight: 131 lb (59.4 kg)  Height: 5\' 6"  (1.676 m)    General: Well developed, well nourished, in no acute distress  Skin : Warm and dry.  Head: Normocephalic and atraumatic  Ears: external ears normal Eyes: Sclera and conjunctiva clear; pupils round and reactive to light; extraocular movements intact   Oropharynx: Pink, supple. No suspicious lesions  Neck: Supple without thyromegaly  Lungs: Respirations unlabored; clear to auscultation bilaterally without wheeze, rales, rhonchi  CVS exam: normal rate and regular rhythm, S1 and S2 normal.  Musculoskeletal:       Cervical back: She exhibits decreased range of motion and tenderness. She exhibits no bony tenderness, no swelling, no edema and no deformity.  Extremities: No edema, cyanosis Vessels: Symmetric bilaterally  Neurologic: Alert and oriented; speech intact; face symmetrical; moves all extremities well; CNII-XII intact without focal deficit  Psychiatric: Normal mood and affect.   Assessment:  1. Neck pain     Plan:   Acute increase in pain after  an injury, but this has been a chronic issue for her Ibuprofen course given-dosing, side effects discussed Continue flexeril prn Home management, red flags and return precautions including when to seek immediate care discussed and printed on AVS F/U in 2 weeks, sooner for new, worsening symptoms Will consider MRI if pain continues at 2 week follow up  Return in about 2 weeks (around 12/12/2017) for follow up - neck pain, consider MRI.  No orders of the defined types were placed in this encounter.   Requested Prescriptions   Signed Prescriptions Disp Refills  . ibuprofen (ADVIL,MOTRIN) 600 MG tablet 30 tablet 0    Sig: Take 1 tablet (600 mg total) by mouth every 8 (eight) hours as needed.    mri

## 2017-11-28 NOTE — Telephone Encounter (Signed)
Incoming call from Patient. With complaint of being hit by a tennis ball in in the right ear.  Patient states that she purchased an brace to help with discomfort. Patient states that she was playing tennis.  Turned her face , was hit with tennis ball in right ear. Area is slightly  reddened.  Patient states she can hear.  Denies cuts.  States it is  Bruised.  Complains of a headache also. Provided an appointment for 11/ 25/19 Patient is to arrive at 3:45 will be seeing   Alphonse GuildAshleigh Shambley NP.  Patient voiced gratitude and understanding. Reviewed care advice.  Voiced understanding.      Reason for Disposition . Direct blow to area (e.g., baseball, punched)  Answer Assessment - Initial Assessment Questions 1. MECHANISM: "How did the injury happen?"      Was playing tennis 2. ONSET: "When did the injury happen?" (Minutes or hours ago)      *this occured last week.   3. LOCATION: "What part of the ear is injured?"      The actual ear 4. APPEARANCE: "What does the ear look like?"      slightly read 5. HEARING: "Was the hearing damaged?"      Patient states she is able to hear 6. SIZE: For cuts, bruises, or swelling, ask: "How large is it?" (e.g., inches or centimeters)    denies 7. PAIN: "Is it painful?" If so, ask: "How bad is the pain?"    (e.g., Scale 1-10; or mild, moderate, severe)     Yes  8. TETANUS: For any breaks in the skin, ask: "When was the last tetanus booster?"     na 9. OTHER SYMPTOMS: "Do you have any other symptoms?" (e.g., neck pain, headache, loss of consciousness)     Reports headache 10. PREGNANCY: "Is there any chance you are pregnant?" "When was your last menstrual period?"       *No Answer*  Protocols used: EAR INJURY-A-AH

## 2017-11-28 NOTE — Patient Instructions (Addendum)
Ibuprofen as needed for neck pain    Musculoskeletal Pain Musculoskeletal pain is muscle and bone aches and pains. This pain can occur in any part of the body. Follow these instructions at home:  Only take medicines for pain, discomfort, or fever as told by your health care provider.  You may continue all activities unless the activities cause more pain. When the pain lessens, slowly resume normal activities. Gradually increase the intensity and duration of the activities or exercise.  During periods of severe pain, bed rest may be helpful. Lie or sit in any position that is comfortable, but get out of bed and walk around at least every several hours.  If directed, put ice on the injured area. ? Put ice in a plastic bag. ? Place a towel between your skin and the bag. ? Leave the ice on for 20 minutes, 2-3 times a day. Contact a health care provider if:  Your pain is getting worse.  Your pain is not relieved with medicines.  You lose function in the area of the pain if the pain is in your arms, legs, or neck. This information is not intended to replace advice given to you by your health care provider. Make sure you discuss any questions you have with your health care provider. Document Released: 12/21/2004 Document Revised: 06/03/2015 Document Reviewed: 08/25/2012 Elsevier Interactive Patient Education  2017 [employer]

## 2017-11-29 ENCOUNTER — Encounter: Payer: Self-pay | Admitting: Nurse Practitioner

## 2017-11-29 NOTE — Telephone Encounter (Signed)
Typically we don't/ can't do much for a broken toe. Buddy taping the toes is fine in general. The concern is if the fracture is non-displaced meaning the bone isn't lined up for easy healing. Those are usually watched a little more closely. Try icing, wrapping, NSAIDs and she can decide if she wants to get it X-rayed.

## 2017-11-30 ENCOUNTER — Ambulatory Visit (INDEPENDENT_AMBULATORY_CARE_PROVIDER_SITE_OTHER)
Admission: RE | Admit: 2017-11-30 | Discharge: 2017-11-30 | Disposition: A | Discharge status: Home / Self Care | Payer: BLUE CROSS/BLUE SHIELD | Source: Ambulatory Visit | Attending: Nurse Practitioner | Admitting: Nurse Practitioner

## 2017-11-30 ENCOUNTER — Encounter: Payer: Self-pay | Admitting: Nurse Practitioner

## 2017-11-30 ENCOUNTER — Ambulatory Visit (INDEPENDENT_AMBULATORY_CARE_PROVIDER_SITE_OTHER): Payer: BLUE CROSS/BLUE SHIELD | Admitting: Nurse Practitioner

## 2017-11-30 VITALS — BP 130/80 | HR 85 | Ht 66.0 in | Wt 131.0 lb

## 2017-11-30 DIAGNOSIS — S92515A Nondisplaced fracture of proximal phalanx of left lesser toe(s), initial encounter for closed fracture: Secondary | ICD-10-CM | POA: Diagnosis not present

## 2017-11-30 DIAGNOSIS — S99922A Unspecified injury of left foot, initial encounter: Secondary | ICD-10-CM | POA: Diagnosis not present

## 2017-11-30 DIAGNOSIS — G44209 Tension-type headache, unspecified, not intractable: Secondary | ICD-10-CM | POA: Diagnosis not present

## 2017-11-30 DIAGNOSIS — G501 Atypical facial pain: Secondary | ICD-10-CM | POA: Diagnosis not present

## 2017-11-30 DIAGNOSIS — G5613 Other lesions of median nerve, bilateral upper limbs: Secondary | ICD-10-CM | POA: Diagnosis not present

## 2017-11-30 DIAGNOSIS — S138XXA Sprain of joints and ligaments of other parts of neck, initial encounter: Secondary | ICD-10-CM | POA: Diagnosis not present

## 2017-11-30 NOTE — Patient Instructions (Signed)
Head downstairs for xrays, ill let you know when I get the results   RICE for Routine Care of Injuries Many injuries can be cared for using rest, ice, compression, and elevation (RICE therapy). Using RICE therapy can help to lessen pain and swelling. It can help your body to heal. Rest Reduce your normal activities and avoid using the injured part of your body. You can go back to your normal activities when you feel okay and your doctor says it is okay. Ice Do not put ice on your bare skin.  Put ice in a plastic bag.  Place a towel between your skin and the bag.  Leave the ice on for 20 minutes, 2-3 times a day.  Do this for as long as told by your doctor. Compression Compression means putting pressure on the injured area. This can be done with an elastic bandage. If an elastic bandage has been applied:  Remove and reapply the bandage every 3-4 hours or as told by your doctor.  Make sure the bandage is not wrapped too tight. Wrap the bandage more loosely if part of your body beyond the bandage is blue, swollen, cold, painful, or loses feeling (numb).  See your doctor if the bandage seems to make your problems worse.  Elevation Elevation means keeping the injured area raised. Raise the injured area above your heart or the center of your chest if you can. When should I get help? You should get help if:  You keep having pain and swelling.  Your symptoms get worse.  Get help right away if: You should get help right away if:  You have sudden bad pain at or below the area of your injury.  You have redness or more swelling around your injury.  You have tingling or numbness at or below the injury that does not go away when you take off the bandage.  This information is not intended to replace advice given to you by your health care provider. Make sure you discuss any questions you have with your health care provider. Document Released: 06/09/2007 Document Revised: 11/18/2015  Document Reviewed: 11/28/2013 Elsevier Interactive Patient Education  2017 [employer]

## 2017-11-30 NOTE — Progress Notes (Signed)
Beth Rivera is a 47 y.o. female with the following history as recorded in [facility]:  Patient Active Problem List   Diagnosis Date Noted  . Neck pain 10/20/2017  . Pelvic swelling, RLQ 09/07/2017  . Reactive airway disease 06/28/2017  . Immunodeficiency, unspecified (HCC) 06/28/2017  . Unexplained weight loss 06/28/2017  . Psoriasis 06/28/2017  . 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 Other allergic rhinitis 03/27/2017  . Trigeminal neuralgia 03/11/2017  . Chronic left shoulder pain 02/03/2017  . Seasonal affective disorder (HCC) 12/11/2015  . Hypersomnia 12/11/2015  . Non-intractable vomiting with nausea 12/11/2015  . Globus pharyngeus 12/11/2015  . Cholelithiasis with chronic cholecystitis 02/02/2015    Current Outpatient Medications  Medication Sig Dispense Refill  . albuterol (PROAIR HFA) 108 (90 Base) MCG/ACT inhaler 2 puffs every 4 hours if needed for wheezing or coughing 1 Inhaler 1  . buPROPion (WELLBUTRIN XL) 150 MG 24 hr tablet Take 1 tablet (150 mg total) by mouth daily. 30 tablet 2  . calcipotriene (DOVONOX) 0.005 % ointment     . cetirizine (KLS ALLER-TEC) 10 MG tablet Take 10 mg by mouth daily.    . cyclobenzaprine (FLEXERIL) 10 MG tablet Take 1 tablet (10 mg total) by mouth at bedtime as needed for muscle spasms. 30 tablet 0  . Ferrous Sulfate (IRON) 325 (65 Fe) MG TABS Take 1 tablet (325 mg total) by mouth 2 (two) times daily with a meal. 30 each 0  . fluticasone (FLONASE) 50 MCG/ACT nasal spray Place 2 sprays into both nostrils daily. 16 g 6  . gabapentin (NEURONTIN) 300 MG capsule Take by mouth.    Marland Kitchen HYDROcodone-acetaminophen (NORCO/VICODIN) 5-325 MG tablet 5-325 tablets.    Marland Kitchen ibuprofen (ADVIL,MOTRIN) 600 MG tablet Take 1 tablet (600 mg total) by mouth every 8 (eight) hours as needed. 30 tablet 0  . Magnesium Gluconate 550 MG TABS Take by mouth.    . montelukast (SINGULAIR) 10 MG tablet One tablet once a day to prevent coughing or wheezing 30 tablet 5  . Multiple  Vitamins-Minerals (MULTIVITAMIN ADULT PO) Take by mouth.    . triamcinolone cream (KENALOG) 0.1 % Apply twice a day if needed to red itchy areas below face 45 g 3   Current Facility-Administered Medications  Medication Dose Route Frequency Provider Last Rate Last Dose  . 0.9 %  sodium chloride infusion  500 mL Intravenous Once Meryl Dare, MD        Allergies: Other  Past Medical History:  Diagnosis Date  . Asthma   . Chronic cholecystitis   . Complication of anesthesia   . Cough LAST 3 WEEKS   CLEAR SPUTUM, NO FEVER   . Depression   . Eczema   . Family history of adverse reaction to anesthesia    SON HAS PONV  . Guttate psoriasis 1993  . IgA deficiency (HCC) 2019  . Neck pain   . PMS (premenstrual syndrome)   . PONV (postoperative nausea and vomiting)   . Poor sleep pattern   . Psoriasis    GUTATE, MOSTLY ON LIMBS  . Recurrent upper respiratory infection (URI)   . Shoulder pain   . Sinus mucosal thickening   . Tinnitus    BOTH EARS  . Trigeminal neuralgia 03/11/2017    Past Surgical History:  Procedure Laterality Date  . CHOLECYSTECTOMY N/A 02/03/2015   Procedure: LAPAROSCOPIC CHOLECYSTECTOMY WITH INTRAOPERATIVE CHOLANGIOGRAM;  Surgeon: Darnell Level, MD;  Location: [facility];  Service: General;  Laterality: N/A;  . WISDOM TOOTH EXTRACTION  Family History  Problem Relation Age of Onset  . Heart disease Paternal Grandfather   . Diabetes Paternal Grandfather   . Heart disease Paternal Grandmother   . Diabetes Paternal Grandmother   . Hernia Father   . Diabetes Father   . Other Father        joint problems  . Thyroid disease Mother   . Other Mother        blood transfusion  . Hepatitis C Mother        contracted from blood transfusion  . Thyroid disease Maternal Aunt   . Thyroid disease Maternal Aunt   . Other Paternal Aunt        joint problems    Social History   Tobacco Use  . Smoking status: Never Smoker  . Smokeless tobacco: Never Used  Substance  Use Topics  . Alcohol use: Yes    Alcohol/week: 2.0 - 4.0 standard drinks    Types: 2 - 4 Standard drinks or equivalent per week    Comment: OCCASIONAL     Subjective:  [name] is here today requesting evaluation of acute complaint of left foot/toe injury, "stubbed" left 5th toe on a chair yesterday, pain to toe and pain/bruising to dorsal aspect of left foot since. She has tried ice, elevation, buddy tape. Pain is no better today. She denies falls, weakness, numbness, tingling.   ROS- See HPI  Objective:  Vitals:   11/30/17 0759  BP: 130/80  Pulse: 85  SpO2: 99%  Weight: 131 lb (59.4 kg)  Height: 5\' 6"  (1.676 m)    General: Well developed, well nourished, in no acute distress  Skin : Warm and dry. Bruising to dorsal aspect of left foot Head: Normocephalic and atraumatic  Eyes: Sclera and conjunctiva clear; pupils round and reactive to light; extraocular movements intact  Oropharynx: Pink, supple. No suspicious lesions  Neck: Supple. Lungs: effort unlabored, no respiratory distress CVS exam: normal rate and regular rhythm.  Musculoskeletal:       Left foot: There is tenderness. There is decreased range of motion, no swelling, normal capillary refill and no deformity.  Extremities: No edema, cyanosis Vessels: Symmetric bilaterally  Neurologic: Alert and oriented; speech intact; face symmetrical; moves all extremities well; CNII-XII intact without focal deficit  Psychiatric: Normal mood and affect.  Assessment:  1. Foot injury, left, initial encounter     Plan:   Imaging ordered Home management, red flags and return precautions including when to seek immediate care discussed and printed on AVS F/U with further recommendations pending test results  No follow-ups on file.  Orders Placed This Encounter  Procedures  . DG Toe 5th Left    Standing Status:   Future    Number of Occurrences:   1    Standing Expiration Date:   01/31/2019    Order Specific Question:    Reason for Exam (SYMPTOM  OR DIAGNOSIS REQUIRED)    Answer:   pain, injury    Order Specific Question:   Is patient pregnant?    Answer:   No    Order Specific Question:   Preferred imaging location?    Answer:   [facility]    Order Specific Question:   Radiology Contrast Protocol - do NOT remove file path    Answer:   \\[email or web address]  . DG Foot Complete Left    Standing Status:   Future    Number of Occurrences:   1    Standing Expiration Date:  01/31/2019    Order Specific Question:   Reason for Exam (SYMPTOM  OR DIAGNOSIS REQUIRED)    Answer:   pain injury bruising    Order Specific Question:   Is patient pregnant?    Answer:   No    Order Specific Question:   Preferred [facility]?    [facility]    Order Specific Question:   Radiology Contrast Protocol - do NOT remove file path    Answer:   \\[email or web address]    Requested Prescriptions    No prescriptions requested or ordered in this encounter

## 2017-12-05 DIAGNOSIS — G5613 Other lesions of median nerve, bilateral upper limbs: Secondary | ICD-10-CM | POA: Diagnosis not present

## 2017-12-05 DIAGNOSIS — S138XXA Sprain of joints and ligaments of other parts of neck, initial encounter: Secondary | ICD-10-CM | POA: Diagnosis not present

## 2017-12-05 DIAGNOSIS — G44209 Tension-type headache, unspecified, not intractable: Secondary | ICD-10-CM | POA: Diagnosis not present

## 2017-12-05 DIAGNOSIS — G501 Atypical facial pain: Secondary | ICD-10-CM | POA: Diagnosis not present

## 2017-12-07 DIAGNOSIS — L570 Actinic keratosis: Secondary | ICD-10-CM | POA: Diagnosis not present

## 2017-12-07 DIAGNOSIS — L400 Psoriasis vulgaris: Secondary | ICD-10-CM | POA: Diagnosis not present

## 2017-12-13 NOTE — Progress Notes (Signed)
[facility] [address] [place], [place] [address] Phone: [phone] Subjective:   Fontaine No, am serving as a scribe for Dr. Hulan Saas.  I'm seeing this patient by the request  of:  Lance Sell, NP   CC: Fifth toe  [id number]  Beth Rivera is a 47 y.o. female coming in with complaint of left 5th toe. Injury occurred 2 weeks ago. Has been in a boot since injury. Stubbed her toe and was told it was broken. Pain is improving but is still having pain to palpation and pain over the met head of the 5th.   Patient did have previous x-rays taken.  These were independently visualized by me showing a very mildly displaced phalanx fracture noted.    Past Medical History:  Diagnosis Date  . Asthma   . Chronic cholecystitis   . Complication of anesthesia   . Cough LAST 3 WEEKS   CLEAR SPUTUM, NO FEVER   . Depression   . Eczema   . Family history of adverse reaction to anesthesia    SON HAS PONV  . Guttate psoriasis 1993  . IgA deficiency ([place]) 2019  . Neck pain   . PMS (premenstrual syndrome)   . PONV (postoperative nausea and vomiting)   . Poor sleep pattern   . Psoriasis    GUTATE, MOSTLY ON LIMBS  . Recurrent upper respiratory infection (URI)   . Shoulder pain   . Sinus mucosal thickening   . Tinnitus    BOTH EARS  . Trigeminal neuralgia 03/11/2017   Past Surgical History:  Procedure Laterality Date  . CHOLECYSTECTOMY N/A 02/03/2015   Procedure: LAPAROSCOPIC CHOLECYSTECTOMY WITH INTRAOPERATIVE CHOLANGIOGRAM;  Surgeon: Armandina Gemma, MD;  Location: [facility];  Service: General;  Laterality: N/A;  . WISDOM TOOTH EXTRACTION     Social History   Socioeconomic History  . Marital status: Married    Spouse name: Not on file  . Number of children: 2  . Years of education: BA  . Highest education level: Not on file  Occupational History  . Not on file  Social Needs  . Financial resource strain: Not on file  . Food  insecurity:    Worry: Not on file    Inability: Not on file  . Transportation needs:    Medical: Not on file    Non-medical: Not on file  Tobacco Use  . Smoking status: Never Smoker  . Smokeless tobacco: Never Used  Substance and Sexual Activity  . Alcohol use: Yes    Alcohol/week: 2.0 - 4.0 standard drinks    Types: 2 - 4 Standard drinks or equivalent per week    Comment: OCCASIONAL  . Drug use: No  . Sexual activity: Yes    Partners: Male    Birth control/protection: Surgical    Comment: husband vasectomy   Lifestyle  . Physical activity:    Days per week: Not on file    Minutes per session: Not on file  . Stress: Not on file  Relationships  . Social connections:    Talks on phone: Not on file    Gets together: Not on file    Attends religious service: Not on file    Active member of club or organization: Not on file    Attends meetings of clubs or organizations: Not on file    Relationship status: Not on file  Other Topics Concern  . Not on file  Social History Narrative  Fun: Biking, hiking and gardening.   Denies abuse and feels safe at home.    Drinks 1 cup of coffee and green tea a day    Allergies  Allergen Reactions  . Other     Seasonal, dogs   Family History  Problem Relation Age of Onset  . Heart disease Paternal Grandfather   . Diabetes Paternal Grandfather   . Heart disease Paternal Grandmother   . Diabetes Paternal Grandmother   . Hernia Father   . Diabetes Father   . Other Father        joint problems  . Thyroid disease Mother   . Other Mother        blood transfusion  . Hepatitis C Mother        contracted from blood transfusion  . Thyroid disease Maternal Aunt   . Thyroid disease Maternal Aunt   . Other Paternal Aunt        joint problems        Current Outpatient Medications (Respiratory):  .  albuterol (PROAIR HFA) 108 (90 Base) MCG/ACT inhaler, 2 puffs every 4 hours if needed for wheezing or coughing .  cetirizine (KLS  ALLER-TEC) 10 MG tablet, Take 10 mg by mouth daily. .  fluticasone (FLONASE) 50 MCG/ACT nasal spray, Place 2 sprays into both nostrils daily. .  montelukast (SINGULAIR) 10 MG tablet, One tablet once a day to prevent coughing or wheezing   Current Outpatient Medications (Analgesics):  .  HYDROcodone-acetaminophen (NORCO/VICODIN) 5-325 MG tablet, 5-325 tablets.   Current Outpatient Medications (Hematological):  Marland Kitchen  Ferrous Sulfate (IRON) 325 (65 Fe) MG TABS, Take 1 tablet (325 mg total) by mouth 2 (two) times daily with a meal.   Current Outpatient Medications (Other):  Marland Kitchen  buPROPion (WELLBUTRIN XL) 150 MG 24 hr tablet, Take 1 tablet (150 mg total) by mouth daily. .  calcipotriene (DOVONOX) 0.005 % ointment,  .  cyclobenzaprine (FLEXERIL) 10 MG tablet, Take 1 tablet (10 mg total) by mouth at bedtime as needed for muscle spasms. .  fluorouracil (EFUDEX) 5 % cream,  .  gabapentin (NEURONTIN) 300 MG capsule, Take by mouth. .  Magnesium Gluconate 550 MG TABS, Take by mouth. .  Multiple Vitamins-Minerals (MULTIVITAMIN ADULT PO), Take by mouth. .  triamcinolone cream (KENALOG) 0.1 %, Apply twice a day if needed to red itchy areas below face  Current Facility-Administered Medications (Other):  .  0.9 %  sodium chloride infusion    Past medical history, social, surgical and family history all reviewed in electronic medical record.  No pertanent information unless stated regarding to the chief complaint.   Review of Systems:  No headache, visual changes, nausea, vomiting, diarrhea, constipation, dizziness, abdominal pain, skin rash, fevers, chills, night sweats, weight loss, swollen lymph nodes, body aches, joint swellingchest pain, shortness of breath, mood changes.  Positive muscle aches  Objective  Blood pressure 118/84, pulse 90, height '5\' 6"'$  (1.676 m), weight 132 lb (59.9 kg), SpO2 98 %.    General: No apparent distress alert and oriented x3 mood and affect normal, dressed appropriately.   HEENT: Pupils equal, extraocular movements intact  Respiratory: Patient's speak in full sentences and does not appear short of breath  Cardiovascular: No lower extremity edema, non tender, no erythema  Skin: Warm dry intact with no signs of infection or rash on extremities or on axial skeleton.  Abdomen: Soft nontender  Neuro: Cranial nerves II through XII are intact, neurovascularly intact in all extremities with 2+ DTRs and  2+ pulses.  Lymph: No lymphadenopathy of posterior or anterior cervical chain or axillae bilaterally.  Gait normal with good balance and coordination.  MSK:  Non tender with full range of motion and good stability and symmetric strength and tone of shoulders, elbows, wrist, hip, knee and ankles bilaterally.  Left foot exam shows the patient does have some swelling over the left phalanx.  No nontender over the base of the fifth metatarsal.  Near full range of motion of the ankle noted.  Limited musculoskeletal ultrasound was performed and interpreted by Lyndal Pulley   Patient's fifth phalanx toe shows some minimal displacement noted.  No significant callus formation.  Mild increase in Doppler flow. Impression: Fifth phalanx fracture delayed healing   Impression and Recommendations:     This case required medical decision making of moderate complexity. The above documentation has been reviewed and is accurate and complete Lyndal Pulley, DO       Note: This dictation was prepared with Dragon dictation along with smaller phrase technology. Any transcriptional errors that result from this process are unintentional.

## 2017-12-14 ENCOUNTER — Ambulatory Visit: Payer: Self-pay

## 2017-12-14 ENCOUNTER — Encounter: Payer: Self-pay | Admitting: Family Medicine

## 2017-12-14 ENCOUNTER — Ambulatory Visit (INDEPENDENT_AMBULATORY_CARE_PROVIDER_SITE_OTHER): Payer: BLUE CROSS/BLUE SHIELD | Admitting: Nurse Practitioner

## 2017-12-14 ENCOUNTER — Ambulatory Visit (INDEPENDENT_AMBULATORY_CARE_PROVIDER_SITE_OTHER): Payer: BLUE CROSS/BLUE SHIELD | Admitting: Family Medicine

## 2017-12-14 ENCOUNTER — Encounter: Payer: Self-pay | Admitting: Nurse Practitioner

## 2017-12-14 VITALS — BP 118/80 | HR 86 | Ht 66.0 in | Wt 132.0 lb

## 2017-12-14 VITALS — BP 118/84 | HR 90 | Ht 66.0 in | Wt 132.0 lb

## 2017-12-14 DIAGNOSIS — S92502G Displaced unspecified fracture of left lesser toe(s), subsequent encounter for fracture with delayed healing: Secondary | ICD-10-CM | POA: Diagnosis not present

## 2017-12-14 DIAGNOSIS — M79675 Pain in left toe(s): Secondary | ICD-10-CM

## 2017-12-14 DIAGNOSIS — M542 Cervicalgia: Secondary | ICD-10-CM | POA: Diagnosis not present

## 2017-12-14 HISTORY — DX: Displaced unspecified fracture of left lesser toe(s), subsequent encounter for fracture with delayed healing: S92.502G

## 2017-12-14 NOTE — Patient Instructions (Addendum)
Please follow up for new or worsening symptoms or if neck pain does not resolve.  Return in about 2-3 months for annual with fasting labs   Neck Exercises Neck exercises can be important for many reasons:  They can help you to improve and maintain flexibility in your neck. This can be especially important as you age.  They can help to make your neck stronger. This can make movement easier.  They can reduce or prevent neck pain.  They may help your upper back.  Ask your health care provider which neck exercises would be best for you. Exercises Neck Press Repeat this exercise 10 times. Do it first thing in the morning and right before bed or as told by your health care provider. 1. Lie on your back on a firm bed or on the floor with a pillow under your head. 2. Use your neck muscles to push your head down on the pillow and straighten your spine. 3. Hold the position as well as you can. Keep your head facing up and your chin tucked. 4. Slowly count to 5 while holding this position. 5. Relax for a few seconds. Then repeat.  Isometric Strengthening Do a full set of these exercises 2 times a day or as told by your health care provider. 1. Sit in a supportive chair and place your hand on your forehead. 2. Push forward with your head and neck while pushing back with your hand. Hold for 10 seconds. 3. Relax. Then repeat the exercise 3 times. 4. Next, do thesequence again, this time putting your hand against the back of your head. Use your head and neck to push backward against the hand pressure. 5. Finally, do the same exercise on either side of your head, pushing sideways against the pressure of your hand.  Prone Head Lifts Repeat this exercise 5 times. Do this 2 times a day or as told by your health care provider. 1. Lie face-down, resting on your elbows so that your chest and upper back are raised. 2. Start with your head facing downward, near your chest. Position your chin either on  or near your chest. 3. Slowly lift your head upward. Lift until you are looking straight ahead. Then continue lifting your head as far back as you can stretch. 4. Hold your head up for 5 seconds. Then slowly lower it to your starting position.  Supine Head Lifts Repeat this exercise 8-10 times. Do this 2 times a day or as told by your health care provider. 1. Lie on your back, bending your knees to point to the ceiling and keeping your feet flat on the floor. 2. Lift your head slowly off the floor, raising your chin toward your chest. 3. Hold for 5 seconds. 4. Relax and repeat.  Scapular Retraction Repeat this exercise 5 times. Do this 2 times a day or as told by your health care provider. 1. Stand with your arms at your sides. Look straight ahead. 2. Slowly pull both shoulders backward and downward until you feel a stretch between your shoulder blades in your upper back. 3. Hold for 10-30 seconds. 4. Relax and repeat.  Contact a health care provider if:  Your neck pain or discomfort gets much worse when you do an exercise.  Your neck pain or discomfort does not improve within 2 hours after you exercise. If you have any of these problems, stop exercising right away. Do not do the exercises again unless your health care provider says that you  can. Get help right away if:  You develop sudden, severe neck pain. If this happens, stop exercising right away. Do not do the exercises again unless your health care provider says that you can. Exercises Neck Stretch  Repeat this exercise 3-5 times. 1. Do this exercise while standing or while sitting in a chair. 2. Place your feet flat on the floor, shoulder-width apart. 3. Slowly turn your head to the right. Turn it all the way to the right so you can look over your right shoulder. Do not tilt or tip your head. 4. Hold this position for 10-30 seconds. 5. Slowly turn your head to the left, to look over your left shoulder. 6. Hold this  position for 10-30 seconds.  Neck Retraction Repeat this exercise 8-10 times. Do this 3-4 times a day or as told by your health care provider. 1. Do this exercise while standing or while sitting in a sturdy chair. 2. Look straight ahead. Do not bend your neck. 3. Use your fingers to push your chin backward. Do not bend your neck for this movement. Continue to face straight ahead. If you are doing the exercise properly, you will feel a slight sensation in your throat and a stretch at the back of your neck. 4. Hold the stretch for 1-2 seconds. Relax and repeat.  This information is not intended to replace advice given to you by your health care provider. Make sure you discuss any questions you have with your health care provider. Document Released: 12/02/2014 Document Revised: 05/29/2015 Document Reviewed: 07/01/2014 Elsevier Interactive Patient Education  [employer].

## 2017-12-14 NOTE — Assessment & Plan Note (Signed)
Patient was put in a postop boot.  Patient does not have any callus formation noted.  I do not believe that the boot that patient was wearing was appropriate.  We discussed posture and ergonomics.  Home exercise, which activities to do which wants to avoid.  Patient will follow-up with me again in 3 weeks Vitamin D given as well.

## 2017-12-14 NOTE — Progress Notes (Signed)
Beth Rivera is a 47 y.o. female with the following history as recorded in [employer]:  Patient Active Problem List   Diagnosis Date Noted  . Neck pain 10/20/2017  . Pelvic swelling, RLQ 09/07/2017  . Reactive airway disease 06/28/2017  . Immunodeficiency, unspecified (HCC) 06/28/2017  . Unexplained weight loss 06/28/2017  . Psoriasis 06/28/2017  . 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 Other allergic rhinitis 03/27/2017  . Trigeminal neuralgia 03/11/2017  . Chronic left shoulder pain 02/03/2017  . Seasonal affective disorder (HCC) 12/11/2015  . Hypersomnia 12/11/2015  . Non-intractable vomiting with nausea 12/11/2015  . Globus pharyngeus 12/11/2015  . Cholelithiasis with chronic cholecystitis 02/02/2015    Current Outpatient Medications  Medication Sig Dispense Refill  . albuterol (PROAIR HFA) 108 (90 Base) MCG/ACT inhaler 2 puffs every 4 hours if needed for wheezing or coughing 1 Inhaler 1  . buPROPion (WELLBUTRIN XL) 150 MG 24 hr tablet Take 1 tablet (150 mg total) by mouth daily. 30 tablet 2  . calcipotriene (DOVONOX) 0.005 % ointment     . cetirizine (KLS ALLER-TEC) 10 MG tablet Take 10 mg by mouth daily.    . cyclobenzaprine (FLEXERIL) 10 MG tablet Take 1 tablet (10 mg total) by mouth at bedtime as needed for muscle spasms. 30 tablet 0  . Ferrous Sulfate (IRON) 325 (65 Fe) MG TABS Take 1 tablet (325 mg total) by mouth 2 (two) times daily with a meal. 30 each 0  . fluticasone (FLONASE) 50 MCG/ACT nasal spray Place 2 sprays into both nostrils daily. 16 g 6  . gabapentin (NEURONTIN) 300 MG capsule Take by mouth.    Marland Kitchen HYDROcodone-acetaminophen (NORCO/VICODIN) 5-325 MG tablet 5-325 tablets.    . Magnesium Gluconate 550 MG TABS Take by mouth.    . montelukast (SINGULAIR) 10 MG tablet One tablet once a day to prevent coughing or wheezing 30 tablet 5  . Multiple Vitamins-Minerals (MULTIVITAMIN ADULT PO) Take by mouth.    . triamcinolone cream (KENALOG) 0.1 % Apply twice a day if needed  to red itchy areas below face 45 g 3  . fluorouracil (EFUDEX) 5 % cream      Current Facility-Administered Medications  Medication Dose Route Frequency Provider Last Rate Last Dose  . 0.9 %  sodium chloride infusion  500 mL Intravenous Once Meryl Dare, MD        Allergies: Other  Past Medical History:  Diagnosis Date  . Asthma   . Chronic cholecystitis   . Complication of anesthesia   . Cough LAST 3 WEEKS   CLEAR SPUTUM, NO FEVER   . Depression   . Eczema   . Family history of adverse reaction to anesthesia    SON HAS PONV  . Guttate psoriasis 1993  . IgA deficiency (HCC) 2019  . Neck pain   . PMS (premenstrual syndrome)   . PONV (postoperative nausea and vomiting)   . Poor sleep pattern   . Psoriasis    GUTATE, MOSTLY ON LIMBS  . Recurrent upper respiratory infection (URI)   . Shoulder pain   . Sinus mucosal thickening   . Tinnitus    BOTH EARS  . Trigeminal neuralgia 03/11/2017    Past Surgical History:  Procedure Laterality Date  . CHOLECYSTECTOMY N/A 02/03/2015   Procedure: LAPAROSCOPIC CHOLECYSTECTOMY WITH INTRAOPERATIVE CHOLANGIOGRAM;  Surgeon: Darnell Level, MD;  Location: [facility];  Service: General;  Laterality: N/A;  . WISDOM TOOTH EXTRACTION      Family History  Problem Relation Age of Onset  .  Heart disease Paternal Grandfather   . Diabetes Paternal Grandfather   . Heart disease Paternal Grandmother   . Diabetes Paternal Grandmother   . Hernia Father   . Diabetes Father   . Other Father        joint problems  . Thyroid disease Mother   . Other Mother        blood transfusion  . Hepatitis C Mother        contracted from blood transfusion  . Thyroid disease Maternal Aunt   . Thyroid disease Maternal Aunt   . Other Paternal Aunt        joint problems    Social History   Tobacco Use  . Smoking status: Never Smoker  . Smokeless tobacco: Never Used  Substance Use Topics  . Alcohol use: Yes    Alcohol/week: 2.0 - 4.0 standard drinks    Types:  2 - 4 Standard drinks or equivalent per week    Comment: OCCASIONAL     Subjective:  Ms Beth Rivera is here today for follow up of neck pain, last saw her on 11/28/17 for evaluation of increased R sided neck pain after she was hit with a tennis ball. She does suffer from chronic neck pain, had recent neck xray done with rheumatology, but noticed acute increase in pain after the tennis ball injury. She was discharged with ibuprofen course, continued flexeril prn and instructions for home management of neck pain, back today for follow up. She tells me today that her neck pain is much better, seems to be improving every day, no longer taking ibuprofen, has continued to follow with chiropractor which seems to help, has not noticed any more "nerve" pain, sometimes neck muscles still feel "a little tight" but does say she has not been doing her neck exercises every day like she used to She denies fevers, chills, weakness, confusion, syncope, numbness or tingling , bowel or bladder changes   ROS- See HPI  Objective:  Vitals:   12/14/17 0950  BP: 118/80  Pulse: 86  SpO2: 98%  Weight: 132 lb (59.9 kg)  Height: 5\' 6"  (1.676 m)    General: Well developed, well nourished, in no acute distress  Skin : Warm and dry.  Head: Normocephalic and atraumatic  Eyes: Sclera and conjunctiva clear; pupils round and reactive to light; extraocular movements intact  Oropharynx: Pink, supple. No suspicious lesions  Neck: Supple Lungs: normal effort, no respiratory distress CVS exam: normal rate and regular rhythm.  Musculoskeletal: cervical back-No deformities; normal range of motion Extremities: No edema, cyanosis  Vessels: Symmetric bilaterally  Neurologic: Alert and oriented; speech intact; face symmetrical; moves all extremities well; CNII-XII intact without focal deficit  Psychiatric: Normal mood and affect.  Assessment:  1. Neck pain     Plan:   Pain has improved and she is feeling much better, no  further workup at this time Home management, neck exercises,red flags and return precautions including when to seek immediate care discussed and printed on AVS She will f/u if pain worsens or does not resolve completely  Return in about 3 months (around 03/15/2018) for CPE.  No orders of the defined types were placed in this encounter.   Requested Prescriptions    No prescriptions requested or ordered in this encounter

## 2017-12-14 NOTE — Patient Instructions (Signed)
Go to see you  Post op boot daily for 3 weeks Once weekly vitamin D for 3 weeks Biking or swimming would be ok  Add 500mg  of vitamin C to your iron  See me again in 3 weeks (ok to double book if needed) Happy holidays!

## 2017-12-20 ENCOUNTER — Other Ambulatory Visit: Payer: Self-pay

## 2017-12-20 ENCOUNTER — Telehealth: Payer: Self-pay

## 2017-12-20 MED ORDER — VITAMIN D (ERGOCALCIFEROL) 1.25 MG (50000 UNIT) PO CAPS: 50000.0000 [IU] | ORAL_CAPSULE | ORAL | 0 refills | Status: DC

## 2017-12-20 NOTE — Telephone Encounter (Signed)
Copied from CRM [id number]. Topic: General - Other >> Dec 20, 2017  8:43 AM Percival SpanishKennedy, Cheryl W wrote:  Pt said she saw Dr Katrinka BlazingSmith last week and he was suppose to send her in a RX for Vitamin D    Pharmacy    [facility] [employer] [facility] [address]

## 2017-12-20 NOTE — Telephone Encounter (Signed)
Spoke with patient that her Rx has been called in.

## 2018-01-10 NOTE — Progress Notes (Signed)
[facility] [address] [place], [place] [address] Phone: [phone] Subjective:     I Beth Rivera am serving as a Neurosurgeon for Dr. Antoine Primas.  CC: Foot pain follow-up  RFF:[id number]  Beth Rivera is a 48 y.o. female coming in with complaint of back pain follow-up.  Found to have a phalanx toe fracture with minimal displacement.  Put into a postop boot and was doing conservative therapy.  Patient states that she hasn't been trying a lot. Hasn't been taping it.        Past Medical History:  Diagnosis Date  . Asthma   . Chronic cholecystitis   . Complication of anesthesia   . Cough LAST 3 WEEKS   CLEAR SPUTUM, NO FEVER   . Depression   . Eczema   . Family history of adverse reaction to anesthesia    SON HAS PONV  . Guttate psoriasis 1993  . IgA deficiency (HCC) 2019  . Neck pain   . PMS (premenstrual syndrome)   . PONV (postoperative nausea and vomiting)   . Poor sleep pattern   . Psoriasis    GUTATE, MOSTLY ON LIMBS  . Recurrent upper respiratory infection (URI)   . Shoulder pain   . Sinus mucosal thickening   . Tinnitus    BOTH EARS  . Trigeminal neuralgia 03/11/2017   Past Surgical History:  Procedure Laterality Date  . CHOLECYSTECTOMY N/A 02/03/2015   Procedure: LAPAROSCOPIC CHOLECYSTECTOMY WITH INTRAOPERATIVE CHOLANGIOGRAM;  Surgeon: Darnell Level, MD;  Location: [facility];  Service: General;  Laterality: N/A;  . WISDOM TOOTH EXTRACTION     Social History   Socioeconomic History  . Marital status: Married    Spouse name: Not on file  . Number of children: 2  . Years of education: BA  . Highest education level: Not on file  Occupational History  . Not on file  Social Needs  . Financial resource strain: Not on file  . Food insecurity:    Worry: Not on file    Inability: Not on file  . Transportation needs:    Medical: Not on file    Non-medical: Not on file  Tobacco Use  . Smoking status: Never Smoker  .  Smokeless tobacco: Never Used  Substance and Sexual Activity  . Alcohol use: Yes    Alcohol/week: 2.0 - 4.0 standard drinks    Types: 2 - 4 Standard drinks or equivalent per week    Comment: OCCASIONAL  . Drug use: No  . Sexual activity: Yes    Partners: Male    Birth control/protection: Surgical    Comment: husband vasectomy   Lifestyle  . Physical activity:    Days per week: Not on file    Minutes per session: Not on file  . Stress: Not on file  Relationships  . Social connections:    Talks on phone: Not on file    Gets together: Not on file    Attends religious service: Not on file    Active member of club or organization: Not on file    Attends meetings of clubs or organizations: Not on file    Relationship status: Not on file  Other Topics Concern  . Not on file  Social History Narrative   Fun: Biking, hiking and gardening.   Denies abuse and feels safe at home.    Drinks 1 cup of coffee and green tea a day    Allergies  Allergen Reactions  .  Other     Seasonal, dogs   Family History  Problem Relation Age of Onset  . Heart disease Paternal Grandfather   . Diabetes Paternal Grandfather   . Heart disease Paternal Grandmother   . Diabetes Paternal Grandmother   . Hernia Father   . Diabetes Father   . Other Father        joint problems  . Thyroid disease Mother   . Other Mother        blood transfusion  . Hepatitis C Mother        contracted from blood transfusion  . Thyroid disease Maternal Aunt   . Thyroid disease Maternal Aunt   . Other Paternal Aunt        joint problems        Current Outpatient Medications (Respiratory):  .  albuterol (PROAIR HFA) 108 (90 Base) MCG/ACT inhaler, 2 puffs every 4 hours if needed for wheezing or coughing .  cetirizine (KLS ALLER-TEC) 10 MG tablet, Take 10 mg by mouth daily. .  fluticasone (FLONASE) 50 MCG/ACT nasal spray, Place 2 sprays into both nostrils daily. .  montelukast (SINGULAIR) 10 MG tablet, One tablet  once a day to prevent coughing or wheezing   Current Outpatient Medications (Analgesics):  .  HYDROcodone-acetaminophen (NORCO/VICODIN) 5-325 MG tablet, 5-325 tablets.   Current Outpatient Medications (Hematological):  Marland Kitchen  Ferrous Sulfate (IRON) 325 (65 Fe) MG TABS, Take 1 tablet (325 mg total) by mouth 2 (two) times daily with a meal.   Current Outpatient Medications (Other):  Marland Kitchen  buPROPion (WELLBUTRIN XL) 150 MG 24 hr tablet, Take 1 tablet (150 mg total) by mouth daily. .  calcipotriene (DOVONOX) 0.005 % ointment,  .  cyclobenzaprine (FLEXERIL) 10 MG tablet, Take 1 tablet (10 mg total) by mouth at bedtime as needed for muscle spasms. .  fluorouracil (EFUDEX) 5 % cream,  .  gabapentin (NEURONTIN) 300 MG capsule, Take by mouth. .  Magnesium Gluconate 550 MG TABS, Take by mouth. .  Multiple Vitamins-Minerals (MULTIVITAMIN ADULT PO), Take by mouth. .  triamcinolone cream (KENALOG) 0.1 %, Apply twice a day if needed to red itchy areas below face .  Vitamin D, Ergocalciferol, (DRISDOL) 1.25 MG (50000 UT) CAPS capsule, Take 1 capsule (50,000 Units total) by mouth every 7 (seven) days.  Current Facility-Administered Medications (Other):  .  0.9 %  sodium chloride infusion    Past medical history, social, surgical and family history all reviewed in electronic medical record.  No pertanent information unless stated regarding to the chief complaint.   Review of Systems:  No headache, visual changes, nausea, vomiting, diarrhea, constipation, dizziness, abdominal pain, skin rash, fevers, chills, night sweats, weight loss, swollen lymph nodes, body aches, joint swelling, muscle aches, chest pain, shortness of breath, mood changes.   Objective  There were no vitals taken for this visit.    General: No apparent distress alert and oriented x3 mood and affect normal, dressed appropriately.  HEENT: Pupils equal, extraocular movements intact  Respiratory: Patient's speak in full sentences and does  not appear short of breath  Cardiovascular: No lower extremity edema, non tender, no erythema  Skin: Warm dry intact with no signs of infection or rash on extremities or on axial skeleton.  Abdomen: Soft nontender  Neuro: Cranial nerves II through XII are intact, neurovascularly intact in all extremities with 2+ DTRs and 2+ pulses.  Lymph: No lymphadenopathy of posterior or anterior cervical chain or axillae bilaterally.  Gait normal with good balance and  coordination.  MSK:  Non tender with full range of motion and good stability and symmetric strength and tone of shoulders, elbows, wrist, hip, knee and ankles bilaterally.    Foot exam shows the patient does have some mild bruising and very mild swelling over the phalanx but nontender.  Near full range of motion.  Neurovascular intact.  Full range of motion of the ankle. Impression and Recommendations:     The above documentation has been reviewed and is accurate and complete Judi SaaZachary M , DO       Note: This dictation was prepared with Dragon dictation along with smaller phrase technology. Any transcriptional errors that result from this process are unintentional.

## 2018-01-11 ENCOUNTER — Encounter: Payer: Self-pay | Admitting: Family Medicine

## 2018-01-11 ENCOUNTER — Ambulatory Visit (INDEPENDENT_AMBULATORY_CARE_PROVIDER_SITE_OTHER): Payer: BLUE CROSS/BLUE SHIELD | Admitting: Family Medicine

## 2018-01-11 DIAGNOSIS — S92502G Displaced unspecified fracture of left lesser toe(s), subsequent encounter for fracture with delayed healing: Secondary | ICD-10-CM | POA: Diagnosis not present

## 2018-01-11 NOTE — Patient Instructions (Signed)
Good to see you  Ice after tennis  See me again when you need me

## 2018-01-11 NOTE — Assessment & Plan Note (Signed)
Patient is asymptomatic  OK to increase activity as tolerated  RTC as needed.

## 2018-01-13 DIAGNOSIS — G500 Trigeminal neuralgia: Secondary | ICD-10-CM | POA: Diagnosis not present

## 2018-01-13 DIAGNOSIS — G501 Atypical facial pain: Secondary | ICD-10-CM | POA: Diagnosis not present

## 2018-01-13 DIAGNOSIS — G5603 Carpal tunnel syndrome, bilateral upper limbs: Secondary | ICD-10-CM | POA: Diagnosis not present

## 2018-01-18 ENCOUNTER — Other Ambulatory Visit (INDEPENDENT_AMBULATORY_CARE_PROVIDER_SITE_OTHER): Payer: BLUE CROSS/BLUE SHIELD

## 2018-01-18 ENCOUNTER — Ambulatory Visit (INDEPENDENT_AMBULATORY_CARE_PROVIDER_SITE_OTHER): Payer: BLUE CROSS/BLUE SHIELD | Admitting: Nurse Practitioner

## 2018-01-18 ENCOUNTER — Ambulatory Visit: Payer: Self-pay | Admitting: Family Medicine

## 2018-01-18 ENCOUNTER — Encounter: Payer: Self-pay | Admitting: Nurse Practitioner

## 2018-01-18 VITALS — BP 120/80 | HR 76 | Ht 66.0 in | Wt 133.0 lb

## 2018-01-18 DIAGNOSIS — Z13220 Encounter for screening for lipoid disorders: Secondary | ICD-10-CM

## 2018-01-18 DIAGNOSIS — Z0001 Encounter for general adult medical examination with abnormal findings: Secondary | ICD-10-CM

## 2018-01-18 DIAGNOSIS — N63 Unspecified lump in unspecified breast: Secondary | ICD-10-CM | POA: Diagnosis not present

## 2018-01-18 DIAGNOSIS — G500 Trigeminal neuralgia: Secondary | ICD-10-CM | POA: Diagnosis not present

## 2018-01-18 DIAGNOSIS — F338 Other recurrent depressive disorders: Secondary | ICD-10-CM | POA: Diagnosis not present

## 2018-01-18 LAB — LIPID PANEL
CHOL/HDL RATIO: 3
Cholesterol: 213 mg/dL — ABNORMAL HIGH (ref 0–200)
HDL: 71.7 mg/dL (ref >39.00)
LDL Cholesterol: 119 mg/dL — ABNORMAL HIGH (ref 0–99)
NonHDL: 140.84
Triglycerides: 111 mg/dL (ref 0.0–149.0)
VLDL: 22.2 mg/dL (ref 0.0–40.0)

## 2018-01-18 LAB — COMPREHENSIVE METABOLIC PANEL
ALT: 12 U/L (ref 0–35)
AST: 14 U/L (ref 0–37)
Albumin: 4.1 g/dL (ref 3.5–5.2)
Alkaline Phosphatase: 49 U/L (ref 39–117)
BILIRUBIN TOTAL: 0.4 mg/dL (ref 0.2–1.2)
BUN: 9 mg/dL (ref 6–23)
CALCIUM: 9.2 mg/dL (ref 8.4–10.5)
CO2: 28 meq/L (ref 19–32)
Chloride: 103 mEq/L (ref 96–112)
Creatinine, Ser: 0.83 mg/dL (ref 0.40–1.20)
GFR: 78.54 mL/min (ref >60.00)
GLUCOSE: 89 mg/dL (ref 70–99)
Potassium: 4.3 mEq/L (ref 3.5–5.1)
Sodium: 137 mEq/L (ref 135–145)
TOTAL PROTEIN: 7.1 g/dL (ref 6.0–8.3)

## 2018-01-18 LAB — TSH: TSH: 1.65 u[IU]/mL (ref 0.35–4.50)

## 2018-01-18 LAB — CBC
HCT: 41.1 % (ref 36.0–46.0)
Hemoglobin: 13.8 g/dL (ref 12.0–15.0)
MCHC: 33.6 g/dL (ref 30.0–36.0)
MCV: 91.2 fl (ref 78.0–100.0)
PLATELETS: 344 10*3/uL (ref 150.0–400.0)
RBC: 4.51 Mil/uL (ref 3.87–5.11)
RDW: 13.4 % (ref 11.5–15.5)
WBC: 6.7 10*3/uL (ref 4.0–10.5)

## 2018-01-18 MED ORDER — CYCLOBENZAPRINE HCL 10 MG PO TABS: 10.0000 mg | ORAL_TABLET | Freq: Every evening | ORAL | 0 refills | Status: DC | PRN

## 2018-01-18 NOTE — Assessment & Plan Note (Signed)
Stable Continue current medications Continue regular follow up with nuerology - cyclobenzaprine (FLEXERIL) 10 MG tablet; Take 1 tablet (10 mg total) by mouth at bedtime as needed for muscle spasms.  Dispense: 30 tablet; Refill: 0

## 2018-01-18 NOTE — Progress Notes (Signed)
Beth Rivera is a 48 y.o. female with the following history as recorded in Epic[employer]:  Patient Active Problem List   Diagnosis Date Noted  . Closed fracture of phalanx of left fifth toe with delayed healing 12/14/2017  . Neck pain 10/20/2017  . Pelvic swelling, RLQ 09/07/2017  . Reactive airway disease 06/28/2017  . Immunodeficiency, unspecified (HCC) 06/28/2017  . Unexplained weight loss 06/28/2017  . Psoriasis 06/28/2017  . 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 Other allergic rhinitis 03/27/2017  . Trigeminal neuralgia 03/11/2017  . Chronic left shoulder pain 02/03/2017  . Seasonal affective disorder (HCC) 12/11/2015  . Hypersomnia 12/11/2015  . Non-intractable vomiting with nausea 12/11/2015  . Globus pharyngeus 12/11/2015  . Cholelithiasis with chronic cholecystitis 02/02/2015    Current Outpatient Medications  Medication Sig Dispense Refill  . albuterol (PROAIR HFA) 108 (90 Base) MCG/ACT inhaler 2 puffs every 4 hours if needed for wheezing or coughing 1 Inhaler 1  . buPROPion (WELLBUTRIN XL) 150 MG 24 hr tablet Take 1 tablet (150 mg total) by mouth daily. 30 tablet 2  . calcipotriene (DOVONOX) 0.005 % ointment     . cetirizine (KLS ALLER-TEC) 10 MG tablet Take 10 mg by mouth daily.    . cyclobenzaprine (FLEXERIL) 10 MG tablet Take 1 tablet (10 mg total) by mouth at bedtime as needed for muscle spasms. 30 tablet 0  . Ferrous Sulfate (IRON) 325 (65 Fe) MG TABS Take 1 tablet (325 mg total) by mouth 2 (two) times daily with a meal. 30 each 0  . fluorouracil (EFUDEX) 5 % cream     . fluticasone (FLONASE) 50 MCG/ACT nasal spray Place 2 sprays into both nostrils daily. 16 g 6  . gabapentin (NEURONTIN) 300 MG capsule Take by mouth.    Marland Kitchen HYDROcodone-acetaminophen (NORCO/VICODIN) 5-325 MG tablet 5-325 tablets.    . Magnesium Gluconate 550 MG TABS Take by mouth.    . Multiple Vitamins-Minerals (MULTIVITAMIN ADULT PO) Take by mouth.    . triamcinolone cream (KENALOG) 0.1 % Apply twice a  day if needed to red itchy areas below face 45 g 3  . Vitamin D, Ergocalciferol, (DRISDOL) 1.25 MG (50000 UT) CAPS capsule Take 1 capsule (50,000 Units total) by mouth every 7 (seven) days. 12 capsule 0   Current Facility-Administered Medications  Medication Dose Route Frequency Provider Last Rate Last Dose  . 0.9 %  sodium chloride infusion  500 mL Intravenous Once Meryl Dare, MD        Allergies: Other  Past Medical History:  Diagnosis Date  . Asthma   . Chronic cholecystitis   . Complication of anesthesia   . Cough LAST 3 WEEKS   CLEAR SPUTUM, NO FEVER   . Depression   . Eczema   . Family history of adverse reaction to anesthesia    SON HAS PONV  . Guttate psoriasis 1993  . IgA deficiency (HCC) 2019  . Neck pain   . PMS (premenstrual syndrome)   . PONV (postoperative nausea and vomiting)   . Poor sleep pattern   . Psoriasis    GUTATE, MOSTLY ON LIMBS  . Recurrent upper respiratory infection (URI)   . Shoulder pain   . Sinus mucosal thickening   . Tinnitus    BOTH EARS  . Trigeminal neuralgia 03/11/2017    Past Surgical History:  Procedure Laterality Date  . CHOLECYSTECTOMY N/A 02/03/2015   Procedure: LAPAROSCOPIC CHOLECYSTECTOMY WITH INTRAOPERATIVE CHOLANGIOGRAM;  Surgeon: Darnell Level, MD;  Location: [facility];  Service: General;  Laterality: N/A;  . WISDOM TOOTH EXTRACTION      Family History  Problem Relation Age of Onset  . Heart disease Paternal Grandfather   . Diabetes Paternal Grandfather   . Heart disease Paternal Grandmother   . Diabetes Paternal Grandmother   . Hernia Father   . Diabetes Father   . Other Father        joint problems  . Thyroid disease Mother   . Other Mother        blood transfusion  . Hepatitis C Mother        contracted from blood transfusion  . Thyroid disease Maternal Aunt   . Thyroid disease Maternal Aunt   . Other Paternal Aunt        joint problems    Social History   Tobacco Use  . Smoking status: Never Smoker  .  Smokeless tobacco: Never Used  Substance Use Topics  . Alcohol use: Yes    Alcohol/week: 2.0 - 4.0 standard drinks    Types: 2 - 4 Standard drinks or equivalent per week    Comment: OCCASIONAL     Subjective:  Here today for CPE, also requesting evaluation of breast lump and flexeril refill which she takes PRN for trigeminal neuralgia, no noted adverse effects of flexeril, does help her pain,[facility] as well as regular chiropractor visits, last saw neurology yesterday. Last dental exam: 2019 Last vision exam: no regular eye exam PAP: up to date per patient by Dr Normand Sloopillard, GYN Patient's last menstrual period was 12/31/2017. Mammogram- up to date by Dr Normand Sloopillard, GYN Lipids: lipid panel ordered DM screening- not indicated Vaccinations: up to date Diet and exercise: plays  tennis, walks dog, tries to pick healthy food choices  SAD- maintained on wellbutrin for SAD, no noted adverse effects and reports good control of mood with wellbutrin, plans to wean herself off in march, has been doing this for several years now  Review of Systems  Constitutional: Negative for chills and fever.  HENT: Negative for hearing loss.   Eyes: Negative for blurred vision and double vision.  Respiratory: Negative for cough and shortness of breath.   Cardiovascular: Negative for chest pain and palpitations.  Gastrointestinal: Negative for abdominal pain, constipation, diarrhea, heartburn, nausea and vomiting.  Genitourinary: Negative for dysuria and hematuria.  Musculoskeletal: Negative for falls.  Skin: Negative for rash.  Neurological: Negative for speech change, loss of consciousness and weakness.  Endo/Heme/Allergies: Does not bruise/bleed easily.  Psychiatric/Behavioral: Negative for depression. The patient is not nervous/anxious.    Breast lump-first noticed prior to last period and has persisted since, describes as tender "cord/lump" under skin of  left axilla.  Patient's last menstrual period was 12/31/2017. She denies any warmth, erythema, nipple discharge. Last MM done around 05/2017, ordered by GYN, and was WNL per patient, not available in chart   Objective:  Vitals:   01/18/18 0857  BP: 120/80  Pulse: 76  SpO2: 97%  Weight: 133 lb (60.3 kg)  Height: 5\' 6"  (1.676 m)    General: Well developed, well nourished, in no acute distress  Skin : Warm and dry.  Head: Normocephalic and atraumatic  Eyes: Sclera and conjunctiva clear; pupils round and reactive to light; extraocular movements intact  Ears: External normal; canals clear; tympanic membranes normal  Oropharynx: Pink, supple. No suspicious lesions  Neck: Supple without thyromegaly, adenopathy  Lungs: Respirations unlabored; clear to auscultation bilaterally without wheeze, rales, rhonchi  CVS exam:  normal rate, regular rhythm, normal S1, S2, no murmurs, rubs, clicks or gallops.  Pulmonary/Chest: Palpable, moveable, nontender mass to left axillary tail.  Left breast exhibits no mass, no nipple discharge, no skin change and no tenderness. Breasts are symmetrical.     Abdomen: Soft; nontender; nondistended; normoactive bowel sounds; no masses or hepatosplenomegaly  Musculoskeletal: No deformities; no active joint inflammation  Extremities: No edema, cyanosis, clubbing  Vessels: Symmetric bilaterally  Neurologic: Alert and oriented; speech intact; face symmetrical; moves all extremities well; CNII-XII intact without focal deficit  Psychiatric: Normal mood and affect.    Assessment:  1. Encounter for general adult medical examination with abnormal findings   2. Trigeminal neuralgia   3. Seasonal affective disorder (HCC)   4. Lump in axillary tail of breast   5. Screening for cholesterol level     Plan:   Lump in axillary tail of breast Imaging ordered for further evaluation - MM DIAG BREAST TOMO BILATERAL; Future - US BREAST LTD UNI LEFT INC AXILLA;  Future  Return in about 1 year (around 01/19/2019) for CPE.  Orders Placed This Encounter  Procedures  . MM DIAG BREAST TOMO BILATERAL    Standing Status:   Future    Standing Expiration Date:   03/19/2019    Order Specific Question:   Reason for Exam (SYMPTOM  OR DIAGNOSIS REQUIRED)    Answer:   lump in breast    Order Specific Question:   Is the patient pregnant?    Answer:   No    Order Specific Question:   Preferred imaging location?    Answer:   [facility]  . US BREAST LTD UNI LEFT INC AXILLA    Standing Status:   Future    Standing Expiration Date:   03/19/2019    Order Specific Question:   Reason for Exam (SYMPTOM  OR DIAGNOSIS REQUIRED)    Answer:   lump in breast    Order Specific Question:   Preferred imaging location?    Answer:   [facility]  . CBC    Standing Status:   Future    Standing Expiration Date:   01/19/2019  . Comprehensive metabolic panel    Standing Status:   Future    Standing Expiration Date:   01/19/2019  . Lipid panel    Standing Status:   Future    Standing Expiration Date:   01/19/2019  . TSH    Standing Status:   Future    Standing Expiration Date:   01/19/2019    Requested Prescriptions   Signed Prescriptions Disp Refills  . cyclobenzaprine (FLEXERIL) 10 MG tablet 30 tablet 0    Sig: Take 1 tablet (10 mg total) by mouth at bedtime as needed for muscle spasms.

## 2018-01-18 NOTE — Assessment & Plan Note (Signed)
Stable Continue wellbutrin as planned with instructions to wean in Spring F/u for new, worsening symptoms

## 2018-01-18 NOTE — Assessment & Plan Note (Signed)
Reviewed annual screening exams, healthy lifestyle,  additional information provided on AVS Will request GYN records for chart - CBC; Future - Comprehensive metabolic panel; Future - Lipid panel; Future - TSH; Future  Screening for cholesterol level She is fasting - Lipid panel; Future

## 2018-01-18 NOTE — Patient Instructions (Signed)
Head downstairs for labs.  You will be contacted about mammogram/ultrasound of breast  I will see you back in 1 year, or sooner if needed.  Health Maintenance, Female Adopting a healthy lifestyle and getting preventive care can go a long way to promote health and wellness. Talk with your health care provider about what schedule of regular examinations is right for you. This is a good chance for you to check in with your provider about disease prevention and staying healthy. In between checkups, there are plenty of things you can do on your own. Experts have done a lot of research about which lifestyle changes and preventive measures are most likely to keep you healthy. Ask your health care provider for more information. Weight and diet Eat a healthy diet  Be sure to include plenty of vegetables, fruits, low-fat dairy products, and lean protein.  Do not eat a lot of foods high in solid fats, added sugars, or salt.  Get regular exercise. This is one of the most important things you can do for your health. ? Most adults should exercise for at least 150 minutes each week. The exercise should increase your heart rate and make you sweat (moderate-intensity exercise). ? Most adults should also do strengthening exercises at least twice a week. This is in addition to the moderate-intensity exercise. Maintain a healthy weight  Body mass index (BMI) is a measurement that can be used to identify possible weight problems. It estimates body fat based on height and weight. Your health care provider can help determine your BMI and help you achieve or maintain a healthy weight.  For females 72 years of age and older: ? A BMI below 18.5 is considered underweight. ? A BMI of 18.5 to 24.9 is normal. ? A BMI of 25 to 29.9 is considered overweight. ? A BMI of 30 and above is considered obese. Watch levels of cholesterol and blood lipids  You should start having your blood tested for lipids and cholesterol at  47 years of age, then have this test every 5 years.  You may need to have your cholesterol levels checked more often if: ? Your lipid or cholesterol levels are high. ? You are older than 47 years of age. ? You are at high risk for heart disease. Cancer screening Lung Cancer  Lung cancer screening is recommended for adults 16-29 years old who are at high risk for lung cancer because of a history of smoking.  A yearly low-dose CT scan of the lungs is recommended for people who: ? Currently smoke. ? Have quit within the past 15 years. ? Have at least a 30-pack-year history of smoking. A pack year is smoking an average of one pack of cigarettes a day for 1 year.  Yearly screening should continue until it has been 15 years since you quit.  Yearly screening should stop if you develop a health problem that would prevent you from having lung cancer treatment. Breast Cancer  Practice breast self-awareness. This means understanding how your breasts normally appear and feel.  It also means doing regular breast self-exams. Let your health care provider know about any changes, no matter how small.  If you are in your 20s or 30s, you should have a clinical breast exam (CBE) by a health care provider every 1-3 years as part of a regular health exam.  If you are 66 or older, have a CBE every year. Also consider having a breast X-ray (mammogram) every year.  If you  have a family history of breast cancer, talk to your health care provider about genetic screening.  If you are at high risk for breast cancer, talk to your health care provider about having an MRI and a mammogram every year.  Breast cancer gene (BRCA) assessment is recommended for women who have family members with BRCA-related cancers. BRCA-related cancers include: ? Breast. ? Ovarian. ? Tubal. ? Peritoneal cancers.  Results of the assessment will determine the need for genetic counseling and BRCA1 and BRCA2 testing. Cervical  Cancer Your health care provider may recommend that you be screened regularly for cancer of the pelvic organs (ovaries, uterus, and vagina). This screening involves a pelvic examination, including checking for microscopic changes to the surface of your cervix (Pap test). You may be encouraged to have this screening done every 3 years, beginning at age 68.  For women ages 65-65, health care providers may recommend pelvic exams and Pap testing every 3 years, or they may recommend the Pap and pelvic exam, combined with testing for human papilloma virus (HPV), every 5 years. Some types of HPV increase your risk of cervical cancer. Testing for HPV may also be done on women of any age with unclear Pap test results.  Other health care providers may not recommend any screening for nonpregnant women who are considered low risk for pelvic cancer and who do not have symptoms. Ask your health care provider if a screening pelvic exam is right for you.  If you have had past treatment for cervical cancer or a condition that could lead to cancer, you need Pap tests and screening for cancer for at least 20 years after your treatment. If Pap tests have been discontinued, your risk factors (such as having a new sexual partner) need to be reassessed to determine if screening should resume. Some women have medical problems that increase the chance of getting cervical cancer. In these cases, your health care provider may recommend more frequent screening and Pap tests. Colorectal Cancer  This type of cancer can be detected and often prevented.  Routine colorectal cancer screening usually begins at 47 years of age and continues through 47 years of age.  Your health care provider may recommend screening at an earlier age if you have risk factors for colon cancer.  Your health care provider may also recommend using home test kits to check for hidden blood in the stool.  A small camera at the end of a tube can be used to  examine your colon directly (sigmoidoscopy or colonoscopy). This is done to check for the earliest forms of colorectal cancer.  Routine screening usually begins at age 56.  Direct examination of the colon should be repeated every 5-10 years through 47 years of age. However, you may need to be screened more often if early forms of precancerous polyps or small growths are found. Skin Cancer  Check your skin from head to toe regularly.  Tell your health care provider about any new moles or changes in moles, especially if there is a change in a mole's shape or color.  Also tell your health care provider if you have a mole that is larger than the size of a pencil eraser.  Always use sunscreen. Apply sunscreen liberally and repeatedly throughout the day.  Protect yourself by wearing long sleeves, pants, a wide-brimmed hat, and sunglasses whenever you are outside. Heart disease, diabetes, and high blood pressure  High blood pressure causes heart disease and increases the risk of stroke. High  blood pressure is more likely to develop in: ? People who have blood pressure in the high end of the normal range (130-139/85-89 mm Hg). ? People who are overweight or obese. ? People who are African American.  If you are 72-62 years of age, have your blood pressure checked every 3-5 years. If you are 4 years of age or older, have your blood pressure checked every year. You should have your blood pressure measured twice-once when you are at a hospital or clinic, and once when you are not at a hospital or clinic. Record the average of the two measurements. To check your blood pressure when you are not at a hospital or clinic, you can use: ? An automated blood pressure machine at a pharmacy. ? A home blood pressure monitor.  If you are between 39 years and 48 years old, ask your health care provider if you should take aspirin to prevent strokes.  Have regular diabetes screenings. This involves taking a blood  sample to check your fasting blood sugar level. ? If you are at a normal weight and have a low risk for diabetes, have this test once every three years after 47 years of age. ? If you are overweight and have a high risk for diabetes, consider being tested at a younger age or more often. Preventing infection Hepatitis B  If you have a higher risk for hepatitis B, you should be screened for this virus. You are considered at high risk for hepatitis B if: ? You were born in a country where hepatitis B is common. Ask your health care provider which countries are considered high risk. ? Your parents were born in a high-risk country, and you have not been immunized against hepatitis B (hepatitis B vaccine). ? You have HIV or AIDS. ? You use needles to inject street drugs. ? You live with someone who has hepatitis B. ? You have had sex with someone who has hepatitis B. ? You get hemodialysis treatment. ? You take certain medicines for conditions, including cancer, organ transplantation, and autoimmune conditions. Hepatitis C  Blood testing is recommended for: ? Everyone born from [birth year] through 1964. ? Anyone with known risk factors for hepatitis C. Sexually transmitted infections (STIs)  You should be screened for sexually transmitted infections (STIs) including gonorrhea and chlamydia if: ? You are sexually active and are younger than 47 years of age. ? You are older than 47 years of age and your health care provider tells you that you are at risk for this type of infection. ? Your sexual activity has changed since you were last screened and you are at an increased risk for chlamydia or gonorrhea. Ask your health care provider if you are at risk.  If you do not have HIV, but are at risk, it may be recommended that you take a prescription medicine daily to prevent HIV infection. This is called pre-exposure prophylaxis (PrEP). You are considered at risk if: ? You are sexually active and do not  regularly use condoms or know the HIV status of your partner(s). ? You take drugs by injection. ? You are sexually active with a partner who has HIV. Talk with your health care provider about whether you are at high risk of being infected with HIV. If you choose to begin PrEP, you should first be tested for HIV. You should then be tested every 3 months for as long as you are taking PrEP. Pregnancy  If you are premenopausal and you  may become pregnant, ask your health care provider about preconception counseling.  If you may become pregnant, take 400 to 800 micrograms (mcg) of folic acid every day.  If you want to prevent pregnancy, talk to your health care provider about birth control (contraception). Osteoporosis and menopause  Osteoporosis is a disease in which the bones lose minerals and strength with aging. This can result in serious bone fractures. Your risk for osteoporosis can be identified using a bone density scan.  If you are 38 years of age or older, or if you are at risk for osteoporosis and fractures, ask your health care provider if you should be screened.  Ask your health care provider whether you should take a calcium or vitamin D supplement to lower your risk for osteoporosis.  Menopause may have certain physical symptoms and risks.  Hormone replacement therapy may reduce some of these symptoms and risks. Talk to your health care provider about whether hormone replacement therapy is right for you. Follow these instructions at home:  Schedule regular health, dental, and eye exams.  Stay current with your immunizations.  Do not use any tobacco products including cigarettes, chewing tobacco, or electronic cigarettes.  If you are pregnant, do not drink alcohol.  If you are breastfeeding, limit how much and how often you drink alcohol.  Limit alcohol intake to no more than 1 drink per day for nonpregnant women. One drink equals 12 ounces of beer, 5 ounces of wine, or 1  ounces of hard liquor.  Do not use street drugs.  Do not share needles.  Ask your health care provider for help if you need support or information about quitting drugs.  Tell your health care provider if you often feel depressed.  Tell your health care provider if you have ever been abused or do not feel safe at home. This information is not intended to replace advice given to you by your health care provider. Make sure you discuss any questions you have with your health care provider. Document Released: 07/06/2010 Document Revised: 05/29/2015 Document Reviewed: 09/24/2014 Elsevier Interactive Patient Education  2019 [employer].

## 2018-02-03 ENCOUNTER — Encounter: Payer: Self-pay | Admitting: Nurse Practitioner

## 2018-03-02 ENCOUNTER — Other Ambulatory Visit: Payer: Self-pay | Admitting: Nurse Practitioner

## 2018-03-02 DIAGNOSIS — N63 Unspecified lump in unspecified breast: Secondary | ICD-10-CM

## 2018-03-06 ENCOUNTER — Ambulatory Visit
Admission: RE | Admit: 2018-03-06 | Discharge: 2018-03-06 | Disposition: A | Discharge status: Home / Self Care | Payer: BLUE CROSS/BLUE SHIELD | Source: Ambulatory Visit | Attending: Nurse Practitioner | Admitting: Nurse Practitioner

## 2018-03-06 ENCOUNTER — Other Ambulatory Visit: Payer: Self-pay | Admitting: Nurse Practitioner

## 2018-03-06 DIAGNOSIS — N63 Unspecified lump in unspecified breast: Secondary | ICD-10-CM

## 2018-03-06 DIAGNOSIS — R922 Inconclusive mammogram: Secondary | ICD-10-CM | POA: Diagnosis not present

## 2018-03-06 DIAGNOSIS — N6489 Other specified disorders of breast: Secondary | ICD-10-CM | POA: Diagnosis not present

## 2018-03-06 DIAGNOSIS — R928 Other abnormal and inconclusive findings on diagnostic imaging of breast: Secondary | ICD-10-CM

## 2018-03-06 NOTE — Progress Notes (Signed)
orders

## 2018-03-07 ENCOUNTER — Other Ambulatory Visit: Payer: Self-pay | Admitting: Family Medicine

## 2018-03-08 ENCOUNTER — Telehealth: Payer: Self-pay | Admitting: *Deleted

## 2018-03-08 DIAGNOSIS — L400 Psoriasis vulgaris: Secondary | ICD-10-CM | POA: Diagnosis not present

## 2018-03-08 DIAGNOSIS — L578 Other skin changes due to chronic exposure to nonionizing radiation: Secondary | ICD-10-CM | POA: Diagnosis not present

## 2018-03-08 DIAGNOSIS — L570 Actinic keratosis: Secondary | ICD-10-CM | POA: Diagnosis not present

## 2018-03-08 NOTE — Telephone Encounter (Signed)
Per Beth Rivera [facility] nothing further is needed. Patient will be contacted to schedule breast MRI.

## 2018-03-08 NOTE — Telephone Encounter (Signed)
Copied from CRM [id number]. Topic: Referral - Request for Referral >> Mar 08, 2018  9:11 AM Trula Slade wrote: Has patient seen PCP for this complaint?   YES *If NO, is insurance requiring patient see PCP for this issue before PCP can refer them? Referral for which specialty:  MRI Preferred provider/office:  NO Reason for referral:  Dense breast tissue

## 2018-03-08 NOTE — Telephone Encounter (Signed)
I placed an order for MRI of her left breast on 3/2, do additional orders need to be placed?

## 2018-03-09 ENCOUNTER — Telehealth: Payer: Self-pay | Admitting: *Deleted

## 2018-03-09 DIAGNOSIS — G501 Atypical facial pain: Secondary | ICD-10-CM | POA: Diagnosis not present

## 2018-03-09 DIAGNOSIS — R928 Other abnormal and inconclusive findings on diagnostic imaging of breast: Secondary | ICD-10-CM

## 2018-03-09 DIAGNOSIS — G5613 Other lesions of median nerve, bilateral upper limbs: Secondary | ICD-10-CM | POA: Diagnosis not present

## 2018-03-09 DIAGNOSIS — G44209 Tension-type headache, unspecified, not intractable: Secondary | ICD-10-CM | POA: Diagnosis not present

## 2018-03-09 DIAGNOSIS — M9901 Segmental and somatic dysfunction of cervical region: Secondary | ICD-10-CM | POA: Diagnosis not present

## 2018-03-09 NOTE — Telephone Encounter (Signed)
Copied from CRM [id number]. Topic: General - Other >> Mar 09, 2018 12:08 PM Jaquita Rector A wrote: Reason for CRM: [facility] regarding orders that have been placed she is asking for the orders to be changed to ( Bilateral with and without contrast) please advise Call back # [phone] [phone]

## 2018-03-09 NOTE — Telephone Encounter (Signed)
New Order has been placed, can you cancel the wrong order?

## 2018-03-10 NOTE — Telephone Encounter (Signed)
MR BREAST LEFT W WO CONTRAST INC CAD  Order cancelled.

## 2018-03-10 NOTE — Addendum Note (Signed)
Addended by: Merrilyn Puma on: 03/10/2018 08:07 AM   Modules accepted: Orders

## 2018-03-13 ENCOUNTER — Other Ambulatory Visit: Payer: Self-pay | Admitting: Nurse Practitioner

## 2018-03-17 ENCOUNTER — Ambulatory Visit
Admission: RE | Admit: 2018-03-17 | Discharge: 2018-03-17 | Disposition: A | Discharge status: Home / Self Care | Payer: BLUE CROSS/BLUE SHIELD | Source: Ambulatory Visit | Attending: Nurse Practitioner | Admitting: Nurse Practitioner

## 2018-03-17 ENCOUNTER — Other Ambulatory Visit: Payer: Self-pay

## 2018-03-17 DIAGNOSIS — Z803 Family history of malignant neoplasm of breast: Secondary | ICD-10-CM | POA: Diagnosis not present

## 2018-03-17 DIAGNOSIS — R928 Other abnormal and inconclusive findings on diagnostic imaging of breast: Secondary | ICD-10-CM

## 2018-03-17 MED ORDER — GADOBUTROL 1 MMOL/ML IV SOLN
7.0000 mL | Freq: Once | INTRAVENOUS | Status: AC | PRN
  Administered 2018-03-17: 7 mL via INTRAVENOUS

## 2018-03-17 MED ORDER — GADOBENATE DIMEGLUMINE 529 MG/ML IV SOLN: 13.0000 mL | Freq: Once | INTRAVENOUS | Status: DC | PRN

## 2018-03-21 ENCOUNTER — Other Ambulatory Visit: Payer: Self-pay

## 2018-04-05 ENCOUNTER — Other Ambulatory Visit: Payer: Self-pay | Admitting: *Deleted

## 2018-04-05 DIAGNOSIS — G500 Trigeminal neuralgia: Secondary | ICD-10-CM

## 2018-04-05 NOTE — Telephone Encounter (Signed)
Ok to Rf? Last filled 02/20/18 LOV: 01/18/18

## 2018-04-07 MED ORDER — CYCLOBENZAPRINE HCL 10 MG PO TABS: 10.0000 mg | ORAL_TABLET | Freq: Every evening | ORAL | 0 refills | Status: DC | PRN

## 2018-05-21 ENCOUNTER — Other Ambulatory Visit: Payer: Self-pay | Admitting: Family

## 2018-05-21 DIAGNOSIS — G500 Trigeminal neuralgia: Secondary | ICD-10-CM

## 2018-05-23 ENCOUNTER — Other Ambulatory Visit: Payer: Self-pay | Admitting: *Deleted

## 2018-05-23 DIAGNOSIS — F338 Other recurrent depressive disorders: Secondary | ICD-10-CM

## 2018-05-23 MED ORDER — BUPROPION HCL ER (XL) 150 MG PO TB24: 150.0000 mg | ORAL_TABLET | Freq: Every day | ORAL | 5 refills | Status: DC

## 2018-06-01 ENCOUNTER — Other Ambulatory Visit: Payer: Self-pay | Admitting: Family Medicine

## 2018-06-01 NOTE — Telephone Encounter (Signed)
Left message for patient to call back  

## 2018-06-26 ENCOUNTER — Other Ambulatory Visit: Payer: Self-pay

## 2018-06-26 ENCOUNTER — Emergency Department (HOSPITAL_COMMUNITY): Payer: BC Managed Care – PPO

## 2018-06-26 ENCOUNTER — Encounter: Payer: Self-pay | Admitting: Physician Assistant

## 2018-06-26 ENCOUNTER — Emergency Department (HOSPITAL_COMMUNITY)
Admission: EM | Admit: 2018-06-26 | Discharge: 2018-06-26 | Disposition: A | Discharge status: Home / Self Care | Payer: BC Managed Care – PPO | Attending: Emergency Medicine | Admitting: Emergency Medicine

## 2018-06-26 ENCOUNTER — Telehealth: Payer: BLUE CROSS/BLUE SHIELD | Admitting: Physician Assistant

## 2018-06-26 ENCOUNTER — Encounter (HOSPITAL_COMMUNITY): Payer: Self-pay | Admitting: Emergency Medicine

## 2018-06-26 DIAGNOSIS — Z20828 Contact with and (suspected) exposure to other viral communicable diseases: Secondary | ICD-10-CM | POA: Insufficient documentation

## 2018-06-26 DIAGNOSIS — G4489 Other headache syndrome: Secondary | ICD-10-CM

## 2018-06-26 DIAGNOSIS — R0602 Shortness of breath: Secondary | ICD-10-CM | POA: Diagnosis not present

## 2018-06-26 DIAGNOSIS — J45909 Unspecified asthma, uncomplicated: Secondary | ICD-10-CM | POA: Insufficient documentation

## 2018-06-26 DIAGNOSIS — R05 Cough: Secondary | ICD-10-CM | POA: Diagnosis not present

## 2018-06-26 DIAGNOSIS — Z20822 Contact with and (suspected) exposure to covid-19: Secondary | ICD-10-CM

## 2018-06-26 DIAGNOSIS — R000 Tachycardia, unspecified: Secondary | ICD-10-CM | POA: Diagnosis not present

## 2018-06-26 DIAGNOSIS — R059 Cough, unspecified: Secondary | ICD-10-CM

## 2018-06-26 DIAGNOSIS — R079 Chest pain, unspecified: Secondary | ICD-10-CM | POA: Diagnosis not present

## 2018-06-26 DIAGNOSIS — Z79899 Other long term (current) drug therapy: Secondary | ICD-10-CM | POA: Insufficient documentation

## 2018-06-26 LAB — CBC WITH DIFFERENTIAL/PLATELET
Abs Immature Granulocytes: 0.02 10*3/uL (ref 0.00–0.07)
Basophils Absolute: 0 10*3/uL (ref 0.0–0.1)
Basophils Relative: 1 %
Eosinophils Absolute: 0.1 10*3/uL (ref 0.0–0.5)
Eosinophils Relative: 2 %
HCT: 39.5 % (ref 36.0–46.0)
Hemoglobin: 13 g/dL (ref 12.0–15.0)
Immature Granulocytes: 0 %
Lymphocytes Relative: 35 %
Lymphs Abs: 2.5 10*3/uL (ref 0.7–4.0)
MCH: 30 pg (ref 26.0–34.0)
MCHC: 32.9 g/dL (ref 30.0–36.0)
MCV: 91.2 fL (ref 80.0–100.0)
Monocytes Absolute: 0.4 10*3/uL (ref 0.1–1.0)
Monocytes Relative: 6 %
Neutro Abs: 4.1 10*3/uL (ref 1.7–7.7)
Neutrophils Relative %: 56 %
Platelets: 332 10*3/uL (ref 150–400)
RBC: 4.33 MIL/uL (ref 3.87–5.11)
RDW: 13.2 % (ref 11.5–15.5)
WBC: 7.2 10*3/uL (ref 4.0–10.5)
nRBC: 0 % (ref 0.0–0.2)

## 2018-06-26 LAB — I-STAT BETA HCG BLOOD, ED (MC, WL, AP ONLY): I-stat hCG, quantitative: <5 m[IU]/mL (ref <5)

## 2018-06-26 LAB — BASIC METABOLIC PANEL
Anion gap: 12 (ref 5–15)
BUN: 11 mg/dL (ref 6–20)
CO2: 21 mmol/L — ABNORMAL LOW (ref 22–32)
Calcium: 8.7 mg/dL — ABNORMAL LOW (ref 8.9–10.3)
Chloride: 106 mmol/L (ref 98–111)
Creatinine, Ser: 0.7 mg/dL (ref 0.44–1.00)
GFR calc Af Amer: >60 mL/min (ref >60)
GFR calc non Af Amer: >60 mL/min (ref >60)
Glucose, Bld: 102 mg/dL — ABNORMAL HIGH (ref 70–99)
Potassium: 3.4 mmol/L — ABNORMAL LOW (ref 3.5–5.1)
Sodium: 139 mmol/L (ref 135–145)

## 2018-06-26 LAB — D-DIMER, QUANTITATIVE: D-Dimer, Quant: <0.27 ug/mL-FEU (ref 0.00–0.50)

## 2018-06-26 LAB — TROPONIN I: Troponin I: <0.03 ng/mL (ref <0.03)

## 2018-06-26 NOTE — ED Provider Notes (Signed)
[facility] DEPT Provider Note   CSN: [id number] Arrival date & time: 06/26/18  1130    History   Chief Complaint Chief Complaint  Patient presents with  . Shortness of Breath    HPI Beth Rivera is a 48 y.o. female.     The history is provided by the patient.  Shortness of Breath Severity:  Mild Onset quality:  Gradual Timing:  Intermittent Progression:  Waxing and waning Chronicity:  New Context: activity   Relieved by:  Nothing Worsened by:  Nothing Associated symptoms: no abdominal pain, no chest pain, no cough, no ear pain, no fever, no rash, no sore throat, no sputum production and no vomiting   Risk factors: no hx of PE/DVT and no recent surgery     Past Medical History:  Diagnosis Date  . Asthma   . Chronic cholecystitis   . Complication of anesthesia   . Cough LAST 3 WEEKS   CLEAR SPUTUM, NO FEVER   . Depression   . Eczema   . Family history of adverse reaction to anesthesia    SON HAS PONV  . Guttate psoriasis 1993  . 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 Neck pain   . PMS (premenstrual syndrome)   . PONV (postoperative nausea and vomiting)   . Poor sleep pattern   . Psoriasis    GUTATE, MOSTLY ON LIMBS  . Recurrent upper respiratory infection (URI)   . Shoulder pain   . Sinus mucosal thickening   . Tinnitus    BOTH EARS  . Trigeminal neuralgia 03/11/2017    Patient Active Problem List   Diagnosis Date Noted  . Encounter for general adult medical examination with abnormal findings 01/18/2018  . Closed fracture of phalanx of left fifth toe with delayed healing 12/14/2017  . Neck pain 10/20/2017  . Pelvic swelling, RLQ 09/07/2017  . Reactive airway disease 06/28/2017  . Immunodeficiency, unspecified ([place]) 06/28/2017  . Unexplained weight loss 06/28/2017  . Psoriasis 06/28/2017  . 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 Other allergic rhinitis 03/27/2017  . Trigeminal neuralgia 03/11/2017  . Chronic left shoulder  pain 02/03/2017  . Seasonal affective disorder ([place]) 12/11/2015  . Hypersomnia 12/11/2015  . Non-intractable vomiting with nausea 12/11/2015  . Globus pharyngeus 12/11/2015  . Cholelithiasis with chronic cholecystitis 02/02/2015    Past Surgical History:  Procedure Laterality Date  . CHOLECYSTECTOMY N/A 02/03/2015   Procedure: LAPAROSCOPIC CHOLECYSTECTOMY WITH INTRAOPERATIVE CHOLANGIOGRAM;  Surgeon: Armandina Gemma, MD;  Location: [facility];  Service: General;  Laterality: N/A;  . WISDOM TOOTH EXTRACTION       OB History    Gravida  2   Para  2   Term      Preterm      AB      Living  2     SAB      TAB      Ectopic      Multiple      Live Births               Home Medications    Prior to Admission medications   Medication Sig Start Date End Date Taking? Authorizing Provider  albuterol (PROAIR HFA) 108 (90 Base) MCG/ACT inhaler 2 puffs every 4 hours if needed for wheezing or coughing 06/28/17   Charlies Silvers, MD  buPROPion (WELLBUTRIN XL) 150 MG 24 hr tablet Take 1 tablet (150 mg total) by mouth daily. 05/23/18   Plotnikov, Evie Lacks, MD  calcipotriene (DOVONOX)  0.005 % ointment  11/04/17   [provider]  cetirizine (KLS ALLER-TEC) 10 MG tablet Take 10 mg by mouth daily.    [provider]  cyclobenzaprine (FLEXERIL) 10 MG tablet TAKE 1 TABLET BY MOUTH AT BEDTIME AS NEEDED FOR MUSCLE SPASMS 05/22/18   Olive BassMurray, Laura Woodruff, FNP  Ferrous Sulfate (IRON) 325 (65 Fe) MG TABS Take 1 tablet (325 mg total) by mouth 2 (two) times daily with a meal. 03/02/17   Meryl DareStark, Malcolm T, MD  fluorouracil (EFUDEX) 5 % cream  12/07/17   [provider]  fluticasone (FLONASE) 50 MCG/ACT nasal spray Place 2 sprays into both nostrils daily. 03/21/17   Evaristo BuryShambley, Ashleigh N, NP  gabapentin (NEURONTIN) 300 MG capsule Take by mouth. 11/10/17 11/10/18  [provider]  HYDROcodone-acetaminophen (NORCO/VICODIN) 5-325 MG tablet 5-325 tablets. 03/17/17   [provider]  Magnesium Gluconate 550 MG TABS Take by mouth.    [provider]  Multiple Vitamins-Minerals (MULTIVITAMIN ADULT PO) Take by mouth.    [provider]  triamcinolone cream (KENALOG) 0.1 % Apply twice a day if needed to red itchy areas below face 06/28/17   Fletcher AnonBardelas, Jose A, MD  Vitamin D, Ergocalciferol, (DRISDOL) 1.25 MG (50000 UT) CAPS capsule TAKE 1 CAPSULE BY MOUTH EVERY 7 DAYS 03/07/18   Judi SaaSmith, Zachary M, DO    Family History Family History  Problem Relation Age of Onset  . Heart disease Paternal Grandfather   . Diabetes Paternal Grandfather   . Heart disease Paternal Grandmother   . Diabetes Paternal Grandmother   . Hernia Father   . Diabetes Father   . Other Father        joint problems  . Thyroid disease Mother   . Other Mother        blood transfusion  . Hepatitis C Mother        contracted from blood transfusion  . Thyroid disease Maternal Aunt   . Breast cancer Maternal Aunt   . Thyroid disease Maternal Aunt   . Other Paternal Aunt        joint problems    Social History Social History   Tobacco Use  . Smoking status: Never Smoker  . Smokeless tobacco: Never Used  Substance Use Topics  . Alcohol use: Yes    Alcohol/week: 2.0 - 4.0 standard drinks    Types: 2 - 4 Standard drinks or equivalent per week    Comment: OCCASIONAL  . Drug use: No     Allergies   Other   Review of Systems Review of Systems  Constitutional: Negative for chills and fever.  HENT: Negative for ear pain and sore throat.   Eyes: Negative for pain and visual disturbance.  Respiratory: Positive for shortness of breath. Negative for cough and sputum production.   Cardiovascular: Negative for chest pain and palpitations.  Gastrointestinal: Negative for abdominal pain and vomiting.  Genitourinary: Negative for dysuria and hematuria.  Musculoskeletal: Negative for arthralgias and back pain.  Skin: Negative for color change and rash.  Neurological:  Negative for seizures and syncope.  All other systems reviewed and are negative.    Physical Exam Updated Vital Signs BP 117/83   Pulse 92   Temp 98.7 F (37.1 C) (Oral)   Resp 19   LMP 06/12/2018   SpO2 98%   Physical Exam Vitals signs and nursing note reviewed.  Constitutional:      General: She is not in acute distress.    Appearance: She is  well-developed.  HENT:     Head: Normocephalic and atraumatic.     Mouth/Throat:     Mouth: Mucous membranes are moist.  Eyes:     Conjunctiva/sclera: Conjunctivae normal.     Pupils: Pupils are equal, round, and reactive to light.  Neck:     Musculoskeletal: Normal range of motion and neck supple.  Cardiovascular:     Rate and Rhythm: Normal rate and regular rhythm.     Pulses: Normal pulses.     Heart sounds: Normal heart sounds. No murmur.  Pulmonary:     Effort: Pulmonary effort is normal. No respiratory distress.     Breath sounds: Normal breath sounds. No decreased breath sounds, wheezing, rhonchi or rales.  Abdominal:     Palpations: Abdomen is soft.     Tenderness: There is no abdominal tenderness.  Musculoskeletal: Normal range of motion.     Right lower leg: No edema.     Left lower leg: No edema.  Skin:    General: Skin is warm and dry.  Neurological:     General: No focal deficit present.     Mental Status: She is alert.      ED Treatments / Results  Labs (all labs ordered are listed, but only abnormal results are displayed) Labs Reviewed  BASIC METABOLIC PANEL - Abnormal; Notable for the following components:      Result Value   Potassium 3.4 (*)    CO2 21 (*)    Glucose, Bld 102 (*)    Calcium 8.7 (*)    All other components within normal limits  NOVEL CORONAVIRUS, NAA (HOSPITAL ORDER, SEND-OUT TO REF LAB)  CBC WITH DIFFERENTIAL/PLATELET  D-DIMER, QUANTITATIVE (NOT AT [facility])  TROPONIN I  I-STAT BETA HCG BLOOD, ED ([facility], [facility], [facility] ONLY)    EKG EKG Interpretation  Date/Time:  Monday June 26 2018  11:40:00 EDT Ventricular Rate:  103 PR Interval:    QRS Duration: 65 QT Interval:  313 QTC Calculation: 410 R Axis:   84 Text Interpretation:  Sinus tachycardia Confirmed by Virgina NorfolkAdam, Emalie Mcwethy [phone]([id number]) on 06/26/2018 12:11:44 PM   Radiology Dg Chest 2 View  Result Date: 06/26/2018 CLINICAL DATA:  Chest pain and shortness of breath.  Cough. EXAM: CHEST - 2 VIEW COMPARISON:  January 18, 2017 FINDINGS: No edema or consolidation. Heart size and pulmonary vascularity are normal. No adenopathy. There is lower thoracic dextroscoliosis. No pneumothorax. IMPRESSION: No edema or consolidation. Electronically Signed   By: Bretta BangWilliam  Woodruff III M.D.   On: 06/26/2018 12:18    Procedures Procedures (including critical care time)  Medications Ordered in ED Medications - No data to display   Initial Impression / Assessment and Plan / ED Course  I have reviewed the triage vital signs and the nursing notes.  Pertinent labs & imaging results that were available during my care of the patient were reviewed by me and considered in my medical decision making (see chart for details).     Beth Rivera is a 48 year old female with history of exercise-induced asthma, IgA deficiency who presentS to the ED with shortness of breath.  Patient with intermittent symptoms over the last several days.  No cough, no sputum production.  No chest pain.  Patient with overall unremarkable exam.  Clear breath sounds.  No signs of volume overload.  Patient with normal vitals.  No fever.  Troponin normal.  EKG with no signs of ischemic changes.  Doubt ACS.  Patient with d-dimer that was negative and doubt  PE.  Chest x-ray without signs of pneumonia, pneumothorax, pleural effusion.  No significant anemia, electrolyte abnormality, kidney injury.  Will swab for coronavirus.  Patient will continue home isolation.  Discharged in ED in good condition.  Given return precautions.  This chart was dictated using voice recognition software.   Despite best efforts to proofread,  errors can occur which can change the documentation meaning.   Beth Rivera was evaluated in Emergency Department on 06/26/2018 for the symptoms described in the history of present illness. She was evaluated in the context of the global COVID-19 pandemic, which necessitated consideration that the patient might be at risk for infection with the SARS-CoV-2 virus that causes COVID-19. Institutional protocols and algorithms that pertain to the evaluation of patients at risk for COVID-19 are in a state of rapid change based on information released by regulatory bodies including the CDC and federal and state organizations. These policies and algorithms were followed during the patient's care in the ED.   Final Clinical Impressions(s) / ED Diagnoses   Final diagnoses:  Shortness of breath    ED Discharge Orders    None       Virgina NorfolkCuratolo, Bobbiejo Ishikawa, DO 06/26/18 1315

## 2018-06-26 NOTE — Progress Notes (Signed)
  E-Visit for [employer] Virus Screening  Based on what you have shared with me, you need to seek an evaluation for a severe illness that is causing your symptoms which may be coronavirus or some other illness. I recommend that you be seen and evaluated "face to face". Our Emergency Departments are best equipped to handle patients with severe symptoms.  You will be evaluated by the ER provider (or higher level of care provider) who will determine whether you need formal testing.   [name],  Dennis Bast indicated that you have frequent shortness of breath even at rest, and without exertion and due to this, I recommend going to the ER.     I recommend the following:  . If you are having a true medical emergency please call 911. . If you are considered high risk for Corona virus because of a known exposure, fever, shortness of breath and cough, OR if you have severe symptoms of any kind, seek medical care at an emergency room.   . [facility] Emergency Department [place], [place], [place] [address] [phone]  . [facility] Emergency Department [place], [place], [place] [address] [phone]  . [facility] Emergency Department [place], [place], [place] [address] [phone]  . [facility] Emergency Department [address] [place], [place], [place] [address] [phone]  . [facility] Emergency Department [place], [place],  [address] [phone]  NOTE: If you entered your credit card information for this eVisit, you will not be charged. You may see a "hold" on your card for the $35 but that hold will drop off and you will not have a charge processed.   Your e-visit answers were reviewed by a board certified advanced clinical practitioner to complete your personal care plan.  Thank you for using e-Visits. I spent 5-10 minutes on review and  completion of this note- Beth Rivera [facility]

## 2018-06-26 NOTE — Addendum Note (Signed)
Addended by: Waldon Merl on: 06/26/2018 04:09 PM   Modules accepted: Orders

## 2018-06-26 NOTE — Progress Notes (Signed)
E-Visit for Corona Virus Screening   You have been enrolled in MyChart Home Monitoring for COVID-19.  Daily you will receive a questionnaire within the MyChart website. Our COVID-19 response team will be monitoring your responses daily.     

## 2018-06-26 NOTE — ED Notes (Signed)
Patient transported to X-ray 

## 2018-06-26 NOTE — Discharge Instructions (Signed)
°If you live with, or provide care at home for, a person confirmed to have, or being evaluated for, COVID-19 infection °please follow these guidelines to prevent infection: ° °Follow healthcare provider’s instructions °Make sure that you understand and can help the patient follow any healthcare provider instructions for all care. ° °Provide for the patient’s basic needs °You should help the patient with basic needs in the home and provide support for getting groceries, prescriptions, and °other personal needs. ° °Monitor the patient’s symptoms °If they are getting sicker, call his or her medical provider a ° This will help the healthcare provider’s office take steps to keep other people from getting infected. °Ask the healthcare provider to call the local or state health department. ° °Limit the number of people who have contact with the patient °If possible, have only one caregiver for the patient. °Other household members should stay in another home or place of residence. If this is not possible, they should stay °in another room, or be separated from the patient as much as possible. Use a separate bathroom, if available. °Restrict visitors who do not have an essential need to be in the home. ° °Keep older adults, very young children, and other sick people away from the patient °Keep older adults, very young children, and those who have compromised immune systems or chronic health conditions away from the patient. This includes people with chronic heart, lung, or kidney conditions, diabetes, and cancer. ° °Ensure good ventilation °Make sure that shared spaces in the home have good air flow, such as from an air conditioner or an opened window, °weather permitting. ° °Wash your hands often °Wash your hands often and thoroughly with soap and water for at least 20 seconds. You can use an alcohol based hand sanitizer if soap and water are not available and if your hands are not visibly dirty. °Avoid touching your  eyes, nose, and mouth with unwashed hands. °Use disposable paper towels to dry your hands. If not available, use dedicated cloth towels and replace them when they become wet. ° °Wear a facemask and gloves °Wear a disposable facemask at all times in the room and gloves when you touch or have contact with the patient’s blood, body fluids, and/or secretions or excretions, such as sweat, saliva, sputum, nasal mucus, vomit, urine, or feces.  Ensure the mask fits over your nose and mouth tightly, and do not touch it during use. °Throw out disposable facemasks and gloves after using them. Do not reuse. °Wash your hands immediately after removing your facemask and gloves. °If your personal clothing becomes contaminated, carefully remove clothing and launder. Wash your hands after handling contaminated clothing. °Place all used disposable facemasks, gloves, and other waste in a lined container before disposing them with other household waste. °Remove gloves and wash your hands immediately after handling these items. ° °Do not share dishes, glasses, or other household items with the patient °Avoid sharing household items. You should not share dishes, drinking glasses, cups, eating utensils, towels, bedding, or other items °After the person uses these items, you should wash them thoroughly with soap and water. ° °Wash laundry thoroughly °Immediately remove and wash clothes or bedding that have blood, body fluids, and/or secretions or excretions, such as sweat, saliva, sputum, nasal mucus, vomit, urine, or feces, on them. °Wear gloves when handling laundry from the patient. °Read and follow directions on labels of laundry or clothing items and detergent. In general, wash and dry with the warmest temperatures recommended on the   label. ° °Clean all areas the individual has used often °Clean all touchable surfaces, such as counters, tabletops, doorknobs, bathroom fixtures, toilets, phones, keyboards, tablets, and bedside tables,  every day. Also, clean any surfaces that may have blood, body fluids, and/or secretions or excretions on them. °Wear gloves when cleaning surfaces the patient has come in contact with. °Use a diluted bleach solution (e.g., dilute bleach with 1 part bleach and 10 parts water) or a household disinfectant with a label that says EPA-registered for coronaviruses. To make a bleach solution at home, add 1 tablespoon of bleach to 1 quart (4 cups) of water. For a larger supply, add ¼ cup of bleach to 1 gallon (16 cups) of water. °Read labels of cleaning products and follow recommendations provided on product labels. Labels contain instructions for safe and effective use of the cleaning product including precautions you should take when applying the product, such as wearing gloves or eye protection and making sure you have good ventilation during use of the product. °Remove gloves and wash hands immediately after cleaning. ° °Monitor yourself for signs and symptoms of illness °Caregivers and household members are considered close contacts, should monitor their health, and will be asked to limit °movement outside of the home to the extent possible. Follow the monitoring steps for close contacts listed on the °symptom monitoring form. ° ° °? If you have additional questions, contact your local health department or call the epidemiologist on call at [phone] °(available 24/7). °? This guidance is subject to change. For the most up-to-date guidance from CDC, please refer to their website: °https://www.cdc.gov/coronavirus/2019-ncov/hcp/guidance-prevent-spread.html  ° °

## 2018-06-26 NOTE — ED Triage Notes (Signed)
Pt c/o intermittent SOB that started June 12. Last episodes were over the weekend. Denies pains. Report always has cough due to IGA def.

## 2018-06-27 ENCOUNTER — Encounter (INDEPENDENT_AMBULATORY_CARE_PROVIDER_SITE_OTHER): Payer: Self-pay

## 2018-06-28 ENCOUNTER — Encounter (INDEPENDENT_AMBULATORY_CARE_PROVIDER_SITE_OTHER): Payer: Self-pay

## 2018-06-28 LAB — NOVEL CORONAVIRUS, NAA (HOSP ORDER, SEND-OUT TO REF LAB; TAT 18-24 HRS): SARS-CoV-2, NAA: NOT DETECTED

## 2018-06-29 ENCOUNTER — Encounter (INDEPENDENT_AMBULATORY_CARE_PROVIDER_SITE_OTHER): Payer: Self-pay

## 2018-09-06 DIAGNOSIS — L570 Actinic keratosis: Secondary | ICD-10-CM | POA: Diagnosis not present

## 2018-09-06 DIAGNOSIS — L814 Other melanin hyperpigmentation: Secondary | ICD-10-CM | POA: Diagnosis not present

## 2018-09-06 DIAGNOSIS — D2272 Melanocytic nevi of left lower limb, including hip: Secondary | ICD-10-CM | POA: Diagnosis not present

## 2018-09-06 DIAGNOSIS — L400 Psoriasis vulgaris: Secondary | ICD-10-CM | POA: Diagnosis not present

## 2018-09-06 DIAGNOSIS — L821 Other seborrheic keratosis: Secondary | ICD-10-CM | POA: Diagnosis not present

## 2018-09-14 DIAGNOSIS — L400 Psoriasis vulgaris: Secondary | ICD-10-CM | POA: Diagnosis not present

## 2018-09-18 DIAGNOSIS — L400 Psoriasis vulgaris: Secondary | ICD-10-CM | POA: Diagnosis not present

## 2018-09-21 DIAGNOSIS — L400 Psoriasis vulgaris: Secondary | ICD-10-CM | POA: Diagnosis not present

## 2018-09-25 DIAGNOSIS — L400 Psoriasis vulgaris: Secondary | ICD-10-CM | POA: Diagnosis not present

## 2018-09-28 DIAGNOSIS — L400 Psoriasis vulgaris: Secondary | ICD-10-CM | POA: Diagnosis not present

## 2018-10-02 DIAGNOSIS — L400 Psoriasis vulgaris: Secondary | ICD-10-CM | POA: Diagnosis not present

## 2018-10-05 DIAGNOSIS — L400 Psoriasis vulgaris: Secondary | ICD-10-CM | POA: Diagnosis not present

## 2018-10-09 DIAGNOSIS — L400 Psoriasis vulgaris: Secondary | ICD-10-CM | POA: Diagnosis not present

## 2018-10-12 DIAGNOSIS — L400 Psoriasis vulgaris: Secondary | ICD-10-CM | POA: Diagnosis not present

## 2018-10-16 DIAGNOSIS — L400 Psoriasis vulgaris: Secondary | ICD-10-CM | POA: Diagnosis not present

## 2018-10-19 DIAGNOSIS — L400 Psoriasis vulgaris: Secondary | ICD-10-CM | POA: Diagnosis not present

## 2018-10-23 DIAGNOSIS — L400 Psoriasis vulgaris: Secondary | ICD-10-CM | POA: Diagnosis not present

## 2018-10-26 DIAGNOSIS — L400 Psoriasis vulgaris: Secondary | ICD-10-CM | POA: Diagnosis not present

## 2018-10-30 DIAGNOSIS — L400 Psoriasis vulgaris: Secondary | ICD-10-CM | POA: Diagnosis not present

## 2018-10-31 DIAGNOSIS — Z1231 Encounter for screening mammogram for malignant neoplasm of breast: Secondary | ICD-10-CM | POA: Diagnosis not present

## 2018-11-02 DIAGNOSIS — L400 Psoriasis vulgaris: Secondary | ICD-10-CM | POA: Diagnosis not present

## 2018-11-06 DIAGNOSIS — L400 Psoriasis vulgaris: Secondary | ICD-10-CM | POA: Diagnosis not present

## 2018-11-09 DIAGNOSIS — L400 Psoriasis vulgaris: Secondary | ICD-10-CM | POA: Diagnosis not present

## 2018-11-13 DIAGNOSIS — L400 Psoriasis vulgaris: Secondary | ICD-10-CM | POA: Diagnosis not present

## 2018-11-16 DIAGNOSIS — L400 Psoriasis vulgaris: Secondary | ICD-10-CM | POA: Diagnosis not present

## 2018-12-15 ENCOUNTER — Ambulatory Visit (INDEPENDENT_AMBULATORY_CARE_PROVIDER_SITE_OTHER): Payer: BC Managed Care – PPO | Admitting: Family Medicine

## 2018-12-15 ENCOUNTER — Other Ambulatory Visit: Payer: Self-pay

## 2018-12-15 ENCOUNTER — Encounter: Payer: Self-pay | Admitting: Family Medicine

## 2018-12-15 DIAGNOSIS — R5383 Other fatigue: Secondary | ICD-10-CM

## 2018-12-15 DIAGNOSIS — F338 Other recurrent depressive disorders: Secondary | ICD-10-CM

## 2018-12-15 DIAGNOSIS — G500 Trigeminal neuralgia: Secondary | ICD-10-CM

## 2018-12-15 DIAGNOSIS — G4700 Insomnia, unspecified: Secondary | ICD-10-CM | POA: Diagnosis not present

## 2018-12-15 DIAGNOSIS — D649 Anemia, unspecified: Secondary | ICD-10-CM | POA: Insufficient documentation

## 2018-12-15 DIAGNOSIS — D802 Selective deficiency of immunoglobulin A [IgA]: Secondary | ICD-10-CM | POA: Diagnosis not present

## 2018-12-15 DIAGNOSIS — E559 Vitamin D deficiency, unspecified: Secondary | ICD-10-CM

## 2018-12-15 MED ORDER — CYCLOBENZAPRINE HCL 10 MG PO TABS: ORAL_TABLET | ORAL | 5 refills | Status: DC

## 2018-12-15 MED ORDER — BUPROPION HCL ER (XL) 150 MG PO TB24: 150.0000 mg | ORAL_TABLET | Freq: Every day | ORAL | 1 refills | Status: DC

## 2018-12-15 NOTE — Progress Notes (Signed)
Virtual Visit via Video Note  I connected with Beth MuscaRachel Conaway  on 12/16/18 at  1:30 PM EST by a video enabled telemedicine application and verified that I am speaking with the correct person using two identifiers.  Location patient: home Location provider:work office Persons participating in the virtual visit: patient, provider  I discussed the limitations of evaluation and management by telemedicine and the availability of in person appointments. The patient expressed understanding and agreed to proceed.   Beth Rivera DOB: 04/14/1970 Encounter date: 12/15/2018  This is a 48 y.o. female who presents to establish care. Hasn't followed up like she normally would due to COVID.    History of present illness: Would like medications. Wants to know about COVID vaccine because she is higher risk.   Would like to see sleep specialist. Sleeps 3-4 hours in day and then again 3-4 at night. Sleep has been an issue for her in different ways in life. Even from childhood. Difficulty with falling asleep; would just stay up until she crashed. Started napping in college and hasn't slept full night sleep since then. 2017 started waking very early in morning and hasn't stopped since. Saw specialist for sleep in past; not certain name. Did at home sleep study and it was inconclusive. There was concern for sleep apnea or narcolepsy. That was about 2016 or 2017. Always tired. Snores some, but not as much as she did in past.   trigem neuralgia:follows with neuro for this.   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   Needs to see immunologist. Has IgA deficiency. Has problems that she feels are part of chronic syndrome. Symptoms - can get triple vision in one or both eyes. Sometimes eyes feel glassy. Dry eye drops don't help. Has chronic cough. Constant allergy type sx - but taking all allergy meds and having these sx all the time. Feels like she gets sinus sx and then doesn't get fever due to chronic immune  deficiency. She was diagnosed with IgA deficiency after her gallbladder surgery in 2018. Had a lot of weight loss after gb surgery. Does throw up occasionally in the mornings.    [place] wellbut[place] and takes until spring. Feels that this is helpful for mood overall.   Takes the flexeril at night and just when needed. Takes one or two nights in a row. Started when she had neck pain/locking up a couple of years ago. Does yoga, stretches. Then when she got trigeminal neuralgia she wasn't able to tolerate the "neuro" meds, but can take the flexeril and it seems to help with episodes. Was told to take gabapentin with flare, but tries not to take it and avoids triggers for the TN.   Takes iron just twice weekly and half tab.    Past Medical History:  Diagnosis Date  . Asthma   . Chronic cholecystitis   . Complication of anesthesia   . Cough LAST 3 WEEKS   CLEAR SPUTUM, NO FEVER   . Depression   . Eczema   . Family history of adverse reaction to anesthesia    SON HAS PONV  . Guttate psoriasis 1993  . IgA deficiency (HCC) 2019  . Neck pain   . PMS (premenstrual syndrome)   . PONV (postoperative nausea and vomiting)   . Poor sleep pattern   . Psoriasis    GUTATE, MOSTLY ON LIMBS  . Recurrent upper respiratory infection (URI)   . Shoulder pain   . Sinus mucosal thickening   .  Tinnitus    BOTH EARS  . Trigeminal neuralgia 03/11/2017   Past Surgical History:  Procedure Laterality Date  . CHOLECYSTECTOMY N/A 02/03/2015   Procedure: LAPAROSCOPIC CHOLECYSTECTOMY WITH INTRAOPERATIVE CHOLANGIOGRAM;  Surgeon: Armandina Gemma, MD;  Location: [facility];  Service: General;  Laterality: N/A;  . WISDOM TOOTH EXTRACTION     Allergies  Allergen Reactions  . Other     Seasonal, dogs   No outpatient medications have been marked as taking for the 12/15/18 encounter (Office Visit) with Caren Macadam, MD.   Current Facility-Administered Medications for the 12/15/18 encounter  (Office Visit) with Caren Macadam, MD  Medication  . 0.9 %  sodium chloride infusion   Social History   Tobacco Use  . Smoking status: Never Smoker  . Smokeless tobacco: Never Used  Substance Use Topics  . Alcohol use: Yes    Alcohol/week: 2.0 - 4.0 standard drinks    Types: 2 - 4 Standard drinks or equivalent per week    Comment: OCCASIONAL   Family History  Problem Relation Age of Onset  . Heart disease Paternal Grandfather   . Diabetes Paternal Grandfather   . Heart disease Paternal Grandmother   . Diabetes Paternal Grandmother   . Obesity Father   . CAD Father        3 stents  . Thyroid disease Mother   . Hepatitis C Mother        contracted from blood transfusion  . Skin cancer Mother   . Thyroid disease Maternal Aunt   . Breast cancer Maternal Aunt   . Thyroid disease Maternal Aunt   . Other Paternal Aunt        joint problems  . Depression Brother      Review of Systems  Constitutional: Negative for chills, fatigue and fever.  Respiratory: Negative for cough, chest tightness, shortness of breath and wheezing.   Cardiovascular: Negative for chest pain, palpitations and leg swelling.    Objective:  There were no vitals taken for this visit.      BP Readings from Last 3 Encounters:  06/26/18 117/83  01/18/18 120/80  01/11/18 110/76   Wt Readings from Last 3 Encounters:  01/18/18 133 lb (60.3 kg)  01/11/18 133 lb (60.3 kg)  12/14/17 132 lb (59.9 kg)    EXAM:  GENERAL: alert, oriented, appears well and in no acute distress  HEENT: atraumatic, conjunctiva clear, no obvious abnormalities on inspection of external nose and ears  NECK: normal movements of the head and neck  LUNGS: on inspection no signs of respiratory distress, breathing rate appears normal, no obvious gross SOB, gasping or wheezing  CV: no obvious cyanosis  MS: moves all visible extremities without noticeable abnormality  PSYCH/NEURO: pleasant and cooperative, no obvious  depression or anxiety, speech and thought processing grossly intact  SKIN: No facial or neck abnormalities.  Assessment/Plan  1. Insomnia, unspecified type - Ambulatory referral to Sleep Studies  2. Fatigue, unspecified type - Ambulatory referral to Sleep Studies  3. IgA deficiency ([place]) She would like to see a specialist to help advise on chronic monitoring/precautions - Ambulatory referral to Immunology  4. Anemia, unspecified type We will schedule blood work.  Physical to follow, but may prefer to wait until Covid risk is better.  5. Trigeminal neuralgia Follows with neurology.  Stable. - cyclobenzaprine (FLEXERIL) 10 MG tablet; TAKE 1 TABLET BY MOUTH AT BEDTIME AS NEEDED FOR MUSCLE SPASMS  Dispense: 30 tablet; Refill: 5  6. Seasonal affective disorder ([place])  Continue current medication.  Mood has been stable. - buPROPion (WELLBUTRIN XL) 150 MG 24 hr tablet; Take 1 tablet (150 mg total) by mouth daily.  Dispense: 90 tablet; Refill: 1    Return for pending lab results.   I discussed the assessment and treatment plan with the patient. The patient was provided an opportunity to ask questions and all were answered. The patient agreed with the plan and demonstrated an understanding of the instructions.   The patient was advised to call back or seek an in-person evaluation if the symptoms worsen or if the condition fails to improve as anticipated.  I provided 28 minutes of non-face-to-face time during this encounter.   Theodis Shove, MD

## 2018-12-16 ENCOUNTER — Encounter: Payer: Self-pay | Admitting: Family Medicine

## 2018-12-18 ENCOUNTER — Other Ambulatory Visit: Payer: Self-pay | Admitting: Family Medicine

## 2018-12-18 DIAGNOSIS — R5383 Other fatigue: Secondary | ICD-10-CM

## 2018-12-19 ENCOUNTER — Telehealth: Payer: Self-pay | Admitting: *Deleted

## 2018-12-19 NOTE — Telephone Encounter (Signed)
Appt scheduled for 12/17 to arrive at 7:30am.

## 2018-12-19 NOTE — Telephone Encounter (Signed)
-----   Message from Caren Macadam, MD sent at 12/16/2018 10:59 AM EST ----- Please schedule labwork

## 2018-12-21 ENCOUNTER — Other Ambulatory Visit: Payer: Self-pay

## 2018-12-21 ENCOUNTER — Other Ambulatory Visit (INDEPENDENT_AMBULATORY_CARE_PROVIDER_SITE_OTHER): Payer: BC Managed Care – PPO

## 2018-12-21 DIAGNOSIS — R5383 Other fatigue: Secondary | ICD-10-CM

## 2018-12-21 DIAGNOSIS — D649 Anemia, unspecified: Secondary | ICD-10-CM | POA: Diagnosis not present

## 2018-12-21 DIAGNOSIS — E559 Vitamin D deficiency, unspecified: Secondary | ICD-10-CM | POA: Diagnosis not present

## 2018-12-21 LAB — CBC WITH DIFFERENTIAL/PLATELET
Basophils Absolute: 0 10*3/uL (ref 0.0–0.1)
Basophils Relative: 0.5 % (ref 0.0–3.0)
Eosinophils Absolute: 0.2 10*3/uL (ref 0.0–0.7)
Eosinophils Relative: 2.9 % (ref 0.0–5.0)
HCT: 38.6 % (ref 36.0–46.0)
Hemoglobin: 13 g/dL (ref 12.0–15.0)
Lymphocytes Relative: 37.4 % (ref 12.0–46.0)
Lymphs Abs: 2.4 10*3/uL (ref 0.7–4.0)
MCHC: 33.5 g/dL (ref 30.0–36.0)
MCV: 88.5 fl (ref 78.0–100.0)
Monocytes Absolute: 0.5 10*3/uL (ref 0.1–1.0)
Monocytes Relative: 7 % (ref 3.0–12.0)
Neutro Abs: 3.4 10*3/uL (ref 1.4–7.7)
Neutrophils Relative %: 52.2 % (ref 43.0–77.0)
Platelets: 349 10*3/uL (ref 150.0–400.0)
RBC: 4.37 Mil/uL (ref 3.87–5.11)
RDW: 14.3 % (ref 11.5–15.5)
WBC: 6.5 10*3/uL (ref 4.0–10.5)

## 2018-12-21 LAB — IBC + FERRITIN
Ferritin: 6.5 ng/mL — ABNORMAL LOW (ref 10.0–291.0)
Iron: 45 ug/dL (ref 42–145)
Saturation Ratios: 12.2 % — ABNORMAL LOW (ref 20.0–50.0)
Transferrin: 263 mg/dL (ref 212.0–360.0)

## 2018-12-21 LAB — VITAMIN B12: Vitamin B-12: 695 pg/mL (ref 211–911)

## 2018-12-21 LAB — VITAMIN D 25 HYDROXY (VIT D DEFICIENCY, FRACTURES): VITD: 27.74 ng/mL — ABNORMAL LOW (ref 30.00–100.00)

## 2018-12-21 LAB — FOLATE: Folate: 7.2 ng/mL (ref >5.9)

## 2018-12-21 MED ORDER — VITAMIN D (ERGOCALCIFEROL) 1.25 MG (50000 UNIT) PO CAPS: 50000.0000 [IU] | ORAL_CAPSULE | ORAL | 0 refills | Status: DC

## 2018-12-22 DIAGNOSIS — Z124 Encounter for screening for malignant neoplasm of cervix: Secondary | ICD-10-CM | POA: Diagnosis not present

## 2018-12-22 DIAGNOSIS — Z01419 Encounter for gynecological examination (general) (routine) without abnormal findings: Secondary | ICD-10-CM | POA: Diagnosis not present

## 2018-12-23 LAB — EXTRA SPECIMEN

## 2018-12-23 LAB — MAGNESIUM, RBC: Magnesium RBC: 5.6 mg/dL (ref 4.0–6.4)

## 2018-12-25 ENCOUNTER — Telehealth: Payer: Self-pay | Admitting: Hematology and Oncology

## 2018-12-25 NOTE — Telephone Encounter (Signed)
A new hem appt has been scheduled for Beth Rivera to see Dr. Lorenso Courier on 1/6 at 9am. Pt aware to arive 15 minutes early.

## 2019-01-09 NOTE — Progress Notes (Signed)
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 Started ferrous sulfate 325mg  BID 2) 03/18/2017: Fecal occult blood cards x 3 negative.  3) 07/2017: received 2 doses of feraheme 510mg  4) 12/21/2018: Iron 45, Sat 12.2, ferritin 6.5, Hgb 13.0 5) 01/10/2019: establish care with Dr. 12/23/2018   CHIEF COMPLAINTS/PURPOSE OF CONSULTATION:  Iron Deficiency  HISTORY OF PRESENTING ILLNESS:  Beth Rivera 49 y.o. female with medical history significant for unspecified sleep disorder, eczema, IgA Deficiency, depression, and psoriasis who presents for evaluation of iron deficiency without anemia.   On review of prior records Mrs. Schwebke was seen on 12/15/2018 by Dr. 57. Iron studies were drawn on 12/21/2018 which showed Iron 45, Sat 12.2, ferritin 6.5, Hgb 13.0. She has had ferrous sulfate 325mg  daily prescribed since 03/02/2017, though she reports she is taking it only twice weekly.  Her diagnosis of IgA deficiency was made in January 2017 after removal of her gallbladder for nausea, vomiting, and loss of appetite.  On exam today Ms. Cremeans notes that she takes iron pills twice weekly.  She reports that she did receive IV iron in July 2019 due to concern that she was not appropriately absorbing iron.  She reports no heavy menstrual cycles and no other overt signs of bleeding.  She reports that her menstrual cycles occur every 24 to 28 days and last for 7 days.  She notes that heaviest days are day 1 and day 2 and that she uses 4 tampons and 2 heavy pads in a 24-hour.  During  her heaviest days.  She does that her periods may have been heavier before she had kids but now they have been more moderate after the age of 4.  She reports that she did have a long-acting subcutaneous birth control prior to her husband's vasectomy.  On further discussion she notes that she thinks that IgA deficiency may be the cause of her current iron deficiency anemia.  She reports that she is prone to sinus and GI infections.  On further discussion she does note that she does have recurrent fatigue and nerve pain which is currently being evaluated by sleep medicine and neurology.  She does not have any symptoms of restless leg, but does report having cravings for ice which has been chronic and lifelong.  She is currently eating a fish-based diet with intentional consumption of red meat twice weekly to improve her iron counts.  She reports eating a lot of shrimp, fish, and salmon.  Her family history is otherwise unremarkable with no other hematological disorders noted.  She is a never smoker the drinks socially.  A full 10 point ROS is listed below.  MEDICAL HISTORY:  Past Medical History:  Diagnosis Date  . Asthma   . Chronic cholecystitis   . Closed fracture of phalanx of left fifth toe with delayed healing 12/14/2017  . Complication of anesthesia   . Cough LAST 3 WEEKS   CLEAR SPUTUM, NO FEVER   . Depression   . Eczema   . Family history of adverse reaction to anesthesia    SON HAS PONV  . Guttate psoriasis 1993  . IgA deficiency (HCC) 2019  .  Neck pain   . PMS (premenstrual syndrome)   . PONV (postoperative nausea and vomiting)   . Poor sleep pattern   . Psoriasis    GUTATE, MOSTLY ON LIMBS  . Recurrent upper respiratory infection (URI)   . Shoulder pain   . Sinus mucosal thickening   . Tinnitus    BOTH EARS  . Trigeminal neuralgia 03/11/2017    SURGICAL HISTORY: Past Surgical History:  Procedure Laterality Date  . CHOLECYSTECTOMY N/A 02/03/2015   Procedure: LAPAROSCOPIC  CHOLECYSTECTOMY WITH INTRAOPERATIVE CHOLANGIOGRAM;  Surgeon: Darnell Level, MD;  Location: [facility];  Service: General;  Laterality: N/A;  . WISDOM TOOTH EXTRACTION      SOCIAL HISTORY: Social History   Socioeconomic History  . Marital status: Married    Spouse name: Not on file  . Number of children: 2  . Years of education: BA  . Highest education level: Not on file  Occupational History  . Not on file  Tobacco Use  . Smoking status: Never Smoker  . Smokeless tobacco: Never Used  Substance and Sexual Activity  . Alcohol use: Yes    Alcohol/week: 2.0 - 4.0 standard drinks    Types: 2 - 4 Standard drinks or equivalent per week    Comment: OCCASIONAL  . Drug use: No  . Sexual activity: Yes    Partners: Male    Birth control/protection: Surgical    Comment: husband vasectomy   Other Topics Concern  . Not on file  Social History Narrative   Fun: Biking, hiking and gardening.   Denies abuse and feels safe at home.    Drinks 1 cup of coffee and green tea a day    Social Determinants of Health   Financial Resource Strain:   . Difficulty of Paying Living Expenses: Not on file  Food Insecurity:   . Worried About Programme researcher, broadcasting/film/video in the Last Year: Not on file  . Ran Out of Food in the Last Year: Not on file  Transportation Needs:   . Lack of Transportation (Medical): Not on file  . Lack of Transportation (Non-Medical): Not on file  Physical Activity:   . Days of Exercise per Week: Not on file  . Minutes of Exercise per Session: Not on file  Stress:   . Feeling of Stress : Not on file  Social Connections:   . Frequency of Communication with Friends and Family: Not on file  . Frequency of Social Gatherings with Friends and Family: Not on file  . Attends Religious Services: Not on file  . Active Member of Clubs or Organizations: Not on file  . Attends Banker Meetings: Not on file  . Marital Status: Not on file  Intimate Partner Violence:   . Fear of Current  or Ex-Partner: Not on file  . Emotionally Abused: Not on file  . Physically Abused: Not on file  . Sexually Abused: Not on file    FAMILY HISTORY: Family History  Problem Relation Age of Onset  . Heart disease Paternal Grandfather   . Diabetes Paternal Grandfather   . Heart disease Paternal Grandmother   . Diabetes Paternal Grandmother   . Obesity Father   . CAD Father        3 stents  . Thyroid disease Mother   . Hepatitis C Mother        contracted from blood transfusion  . Skin cancer Mother   . Thyroid disease Maternal Aunt   . Breast cancer Maternal Aunt   .  Thyroid disease Maternal Aunt   . Other Paternal Aunt        joint problems  . Depression Brother     ALLERGIES:  is allergic to other.  MEDICATIONS:  Current Outpatient Medications  Medication Sig Dispense Refill  . Ferrous Sulfate (IRON) 325 (65 Fe) MG TABS Take 1 tablet (325 mg total) by mouth 2 (two) times daily with a meal. 30 each 0  . buPROPion (WELLBUTRIN XL) 150 MG 24 hr tablet Take 1 tablet (150 mg total) by mouth daily. 90 tablet 1  . cyclobenzaprine (FLEXERIL) 10 MG tablet TAKE 1 TABLET BY MOUTH AT BEDTIME AS NEEDED FOR MUSCLE SPASMS 30 tablet 5  . fluticasone (FLONASE) 50 MCG/ACT nasal spray Place 2 sprays into both nostrils daily. 16 g 6  . gabapentin (NEURONTIN) 300 MG capsule Take by mouth.    . Magnesium Gluconate 550 MG TABS Take by mouth.    . Vitamin D, Ergocalciferol, (DRISDOL) 1.25 MG (50000 UT) CAPS capsule Take 1 capsule (50,000 Units total) by mouth every 7 (seven) days. 12 capsule 0   Current Facility-Administered Medications  Medication Dose Route Frequency Provider Last Rate Last Admin  . 0.9 %  sodium chloride infusion  500 mL Intravenous Once Ladene Artist, MD        REVIEW OF SYSTEMS:   Constitutional: ( - ) fevers, ( - )  chills , ( - ) night sweats Eyes: ( - ) blurriness of vision, ( - ) double vision, ( - ) watery eyes Ears, nose, mouth, throat, and face: ( - ) mucositis,  ( - ) sore throat Respiratory: ( - ) cough, ( - ) dyspnea, ( - ) wheezes Cardiovascular: ( - ) palpitation, ( - ) chest discomfort, ( - ) lower extremity swelling Gastrointestinal:  ( - ) nausea, ( - ) heartburn, ( - ) change in bowel habits Skin: ( - ) abnormal skin rashes Lymphatics: ( - ) new lymphadenopathy, ( - ) easy bruising Neurological: ( - ) numbness, ( - ) tingling, ( - ) new weaknesses Behavioral/Psych: ( - ) mood change, ( - ) new changes  All other systems were reviewed with the patient and are negative.  PHYSICAL EXAMINATION: ECOG PERFORMANCE STATUS: 1 - Symptomatic but completely ambulatory  Vitals:   01/10/19 0937  BP: 107/80  Pulse: 79  Resp: 17  Temp: 99 F (37.2 C)  SpO2: 98%   Filed Weights   01/10/19 0937  Weight: 135 lb 14.4 oz (61.6 kg)    GENERAL: well appearing middle aged Caucasian female in NAD  SKIN: skin color, texture, turgor are normal, no rashes or significant lesions EYES: conjunctiva are pink and non-injected, sclera clear LUNGS: clear to auscultation and percussion with normal breathing effort HEART: regular rate & rhythm and no murmurs and no lower extremity edema ABDOMEN: soft, non-tender, non-distended, normal bowel sounds Musculoskeletal: no cyanosis of digits and no clubbing  PSYCH: alert & oriented x 3, fluent speech NEURO: no focal motor/sensory deficits  LABORATORY DATA:  I have reviewed the data as listed Lab Results  Component Value Date   WBC 6.3 01/10/2019   HGB 12.8 01/10/2019   HCT 38.6 01/10/2019   MCV 87.5 01/10/2019   PLT 315 01/10/2019   NEUTROABS 3.0 01/10/2019    PATHOLOGY: None to review.   RADIOGRAPHIC STUDIES: None to review.  ASSESSMENT & PLAN Kimmerly Lora 49 y.o. female with medical history significant for unspecified sleep disorder, eczema, IgA Deficiency, depression, and psoriasis  who presents for evaluation of iron deficiency without anemia.  After review of the labs and discussion with the  patient the findings are most consistent with an iron deficiency anemia.  At this time the source of the iron deficiency anemia is not entirely clear.  The patient used to have heavy periods, but no longer has these.  Her iron intake is not particularly high as she has a fish-based diet, but she does not have any strong systemic inflammatory conditions that would make me think that she has poor absorption.  She is taking only a modest dose of iron with 2 of the 325 mg pills per week.  Otherwise she is not truly anemic and is only iron deficient.  Given that she does not have a true anemia I would not recommend IV iron supplementation at this time.  She does have low total body stores and this can potentially be repleted with increased p.o. supplementation.  In the event that she does develop anemia and this fails to maintain her current hemoglobin I would recommend consideration of IV iron.  We will have her follow-up in 3 months time to determine the next best course of action.  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 No clear indication for IV iron in the absence of anemia --to correct the iron deficiency the patient will require control of the source of blood loss. At this time her menstrual cycles do not appear to be the source and she does not have any signs/symptoms concerning for GI blood loss.  --possible issues with iron absorption due to chronic inflammation, will check inflammatory markers today.  --RTC in 3 months time to further assess. If the patient becomes anemic as a result of iron deficiency we can consider starting IV iron.   #IgA Deficiency  --noted to have IgA of 1 (nml [id number])  --if transfusion is required in the future, patient will need washed RBC to assure no allergic rxn to donor IgA.  --continue to monitor  Orders Placed  This Encounter  Procedures  . CBC with Differential (Cancer Center Only)    Standing Status:   Future    Number of Occurrences:   1    Standing Expiration Date:   01/10/2020  . Retic Panel    Standing Status:   Future    Number of Occurrences:   1    Standing Expiration Date:   01/10/2020  . Sedimentation rate    Standing Status:   Future    Number of Occurrences:   1    Standing Expiration Date:   01/10/2020  . CMP (Cancer Center only)    Standing Status:   Future    Number of Occurrences:   1    Standing Expiration Date:   01/10/2020  . Iron and TIBC    Standing Status:   Future    Number of Occurrences:   1    Standing Expiration Date:   01/10/2020  . Ferritin    Standing Status:   Future    Number of Occurrences:   1    Standing Expiration Date:   01/10/2020  . C-reactive protein    Standing Status:   Future    Number of Occurrences:   1    Standing Expiration Date:   01/10/2020    All questions were answered. The patient  knows to call the clinic with any problems, questions or concerns.  A total of more than 60 minutes were spent on this encounter and over half of that time was spent on counseling and coordination of care as outlined above.   Ulysees BarnsJohn T. Almarie Kurdziel, MD Department of Hematology/Oncology [facility] Phone: [phone] Pager: [phone] Email: [email or web address]  01/10/2019 11:14 AM

## 2019-01-10 ENCOUNTER — Inpatient Hospital Stay: Payer: 59 | Attending: Hematology and Oncology | Admitting: Hematology and Oncology

## 2019-01-10 ENCOUNTER — Other Ambulatory Visit: Payer: Self-pay

## 2019-01-10 ENCOUNTER — Inpatient Hospital Stay: Payer: 59

## 2019-01-10 VITALS — BP 107/80 | HR 79 | Temp 99.0°F | Resp 17 | Ht 66.0 in | Wt 135.9 lb

## 2019-01-10 DIAGNOSIS — E611 Iron deficiency: Secondary | ICD-10-CM | POA: Diagnosis present

## 2019-01-10 DIAGNOSIS — Z803 Family history of malignant neoplasm of breast: Secondary | ICD-10-CM | POA: Diagnosis not present

## 2019-01-10 DIAGNOSIS — D802 Selective deficiency of immunoglobulin A [IgA]: Secondary | ICD-10-CM

## 2019-01-10 DIAGNOSIS — D509 Iron deficiency anemia, unspecified: Secondary | ICD-10-CM

## 2019-01-10 DIAGNOSIS — F329 Major depressive disorder, single episode, unspecified: Secondary | ICD-10-CM | POA: Insufficient documentation

## 2019-01-10 DIAGNOSIS — Z808 Family history of malignant neoplasm of other organs or systems: Secondary | ICD-10-CM | POA: Insufficient documentation

## 2019-01-10 DIAGNOSIS — L299 Pruritus, unspecified: Secondary | ICD-10-CM | POA: Diagnosis not present

## 2019-01-10 LAB — CMP (CANCER CENTER ONLY)
ALT: 10 U/L (ref 0–44)
AST: 16 U/L (ref 15–41)
Albumin: 4.1 g/dL (ref 3.5–5.0)
Alkaline Phosphatase: 54 U/L (ref 38–126)
Anion gap: 7 (ref 5–15)
BUN: 9 mg/dL (ref 6–20)
CO2: 26 mmol/L (ref 22–32)
Calcium: 8.3 mg/dL — ABNORMAL LOW (ref 8.9–10.3)
Chloride: 106 mmol/L (ref 98–111)
Creatinine: 0.79 mg/dL (ref 0.44–1.00)
GFR, Est AFR Am: >60 mL/min (ref >60)
GFR, Estimated: >60 mL/min (ref >60)
Glucose, Bld: 89 mg/dL (ref 70–99)
Potassium: 4 mmol/L (ref 3.5–5.1)
Sodium: 139 mmol/L (ref 135–145)
Total Bilirubin: 0.3 mg/dL (ref 0.3–1.2)
Total Protein: 7.2 g/dL (ref 6.5–8.1)

## 2019-01-10 LAB — RETIC PANEL
Immature Retic Fract: 9.7 % (ref 2.3–15.9)
RBC.: 4.34 MIL/uL (ref 3.87–5.11)
Retic Count, Absolute: 64.2 10*3/uL (ref 19.0–186.0)
Retic Ct Pct: 1.5 % (ref 0.4–3.1)
Reticulocyte Hemoglobin: 34.8 pg (ref >27.9)

## 2019-01-10 LAB — CBC WITH DIFFERENTIAL (CANCER CENTER ONLY)
Abs Immature Granulocytes: 0.01 10*3/uL (ref 0.00–0.07)
Basophils Absolute: 0 10*3/uL (ref 0.0–0.1)
Basophils Relative: 1 %
Eosinophils Absolute: 0.2 10*3/uL (ref 0.0–0.5)
Eosinophils Relative: 2 %
HCT: 38.6 % (ref 36.0–46.0)
Hemoglobin: 12.8 g/dL (ref 12.0–15.0)
Immature Granulocytes: 0 %
Lymphocytes Relative: 43 %
Lymphs Abs: 2.7 10*3/uL (ref 0.7–4.0)
MCH: 29 pg (ref 26.0–34.0)
MCHC: 33.2 g/dL (ref 30.0–36.0)
MCV: 87.5 fL (ref 80.0–100.0)
Monocytes Absolute: 0.4 10*3/uL (ref 0.1–1.0)
Monocytes Relative: 6 %
Neutro Abs: 3 10*3/uL (ref 1.7–7.7)
Neutrophils Relative %: 48 %
Platelet Count: 315 10*3/uL (ref 150–400)
RBC: 4.41 MIL/uL (ref 3.87–5.11)
RDW: 13.8 % (ref 11.5–15.5)
WBC Count: 6.3 10*3/uL (ref 4.0–10.5)
nRBC: 0 % (ref 0.0–0.2)

## 2019-01-10 LAB — IRON AND TIBC
Iron: 36 ug/dL — ABNORMAL LOW (ref 41–142)
Saturation Ratios: 11 % — ABNORMAL LOW (ref 21–57)
TIBC: 335 ug/dL (ref 236–444)
UIBC: 299 ug/dL (ref 120–384)

## 2019-01-10 LAB — SEDIMENTATION RATE: Sed Rate: 5 mm/hr (ref 0–22)

## 2019-01-10 LAB — C-REACTIVE PROTEIN: CRP: 0.6 mg/dL (ref <1.0)

## 2019-01-10 LAB — FERRITIN: Ferritin: 6 ng/mL — ABNORMAL LOW (ref 11–307)

## 2019-01-11 ENCOUNTER — Telehealth: Payer: Self-pay | Admitting: Hematology and Oncology

## 2019-01-11 NOTE — Telephone Encounter (Signed)
Scheduled per los. Called, no answer. Mailed printout  

## 2019-01-18 ENCOUNTER — Telehealth: Payer: Self-pay | Admitting: *Deleted

## 2019-01-18 NOTE — Telephone Encounter (Signed)
Received call back from patient. Reviewed her lab work with her, instructed to continue her oral iron 2-3 x a week as tolerated. Advised that we would see her back in 3 months for repeat lab work. Beth Rivera voiced understanding. No further questions or concerns.

## 2019-01-18 NOTE — Telephone Encounter (Signed)
-----   Message from Jaci Standard, MD sent at 01/18/2019  9:47 AM EST ----- Please call Beth Rivera to let her know her Hgb looks good, but she is still iron deficient. Recommend she continue taking iron pills (2-3 times per week, as she can tolerate) to increase her levels. We will see her back in 3 months to recheck her iron levels and make sure she is not becoming anemic.  [name]  ----- Message ----- From: Leory Plowman, Lab In [place] Sent: 01/10/2019  10:59 AM EST To: Jaci Standard, MD

## 2019-01-18 NOTE — Telephone Encounter (Signed)
TCT patient regarding her lab results from 01/10/19. No answer to call but was able to leave vm message for pt to return call at her convenience. Provided call back #   [phone]

## 2019-03-14 ENCOUNTER — Other Ambulatory Visit: Payer: Self-pay | Admitting: Family Medicine

## 2019-04-10 ENCOUNTER — Telehealth: Payer: Self-pay | Admitting: Hematology and Oncology

## 2019-04-10 NOTE — Telephone Encounter (Signed)
R/s 4/7 appt per provider PAL. Called and spoke with patient. Confirmed appt

## 2019-04-11 ENCOUNTER — Ambulatory Visit: Payer: BC Managed Care – PPO | Admitting: Hematology and Oncology

## 2019-04-11 ENCOUNTER — Other Ambulatory Visit: Payer: BC Managed Care – PPO

## 2019-04-19 ENCOUNTER — Other Ambulatory Visit: Payer: Self-pay | Admitting: Hematology and Oncology

## 2019-04-19 DIAGNOSIS — D509 Iron deficiency anemia, unspecified: Secondary | ICD-10-CM

## 2019-04-20 ENCOUNTER — Other Ambulatory Visit: Payer: Self-pay

## 2019-04-20 ENCOUNTER — Inpatient Hospital Stay: Payer: 59 | Admitting: Hematology and Oncology

## 2019-04-20 ENCOUNTER — Inpatient Hospital Stay: Payer: 59 | Attending: Hematology and Oncology

## 2019-04-20 VITALS — BP 125/85 | HR 81 | Temp 98.3°F | Resp 18 | Ht 66.0 in | Wt 136.1 lb

## 2019-04-20 DIAGNOSIS — E611 Iron deficiency: Secondary | ICD-10-CM | POA: Insufficient documentation

## 2019-04-20 DIAGNOSIS — K5900 Constipation, unspecified: Secondary | ICD-10-CM | POA: Insufficient documentation

## 2019-04-20 DIAGNOSIS — D509 Iron deficiency anemia, unspecified: Secondary | ICD-10-CM

## 2019-04-20 DIAGNOSIS — R195 Other fecal abnormalities: Secondary | ICD-10-CM | POA: Diagnosis not present

## 2019-04-20 DIAGNOSIS — D500 Iron deficiency anemia secondary to blood loss (chronic): Secondary | ICD-10-CM

## 2019-04-20 LAB — CBC WITH DIFFERENTIAL (CANCER CENTER ONLY)
Abs Immature Granulocytes: 0.01 10*3/uL (ref 0.00–0.07)
Basophils Absolute: 0 10*3/uL (ref 0.0–0.1)
Basophils Relative: 1 %
Eosinophils Absolute: 0.1 10*3/uL (ref 0.0–0.5)
Eosinophils Relative: 2 %
HCT: 40.1 % (ref 36.0–46.0)
Hemoglobin: 13.1 g/dL (ref 12.0–15.0)
Immature Granulocytes: 0 %
Lymphocytes Relative: 33 %
Lymphs Abs: 1.9 10*3/uL (ref 0.7–4.0)
MCH: 29.8 pg (ref 26.0–34.0)
MCHC: 32.7 g/dL (ref 30.0–36.0)
MCV: 91.3 fL (ref 80.0–100.0)
Monocytes Absolute: 0.4 10*3/uL (ref 0.1–1.0)
Monocytes Relative: 7 %
Neutro Abs: 3.3 10*3/uL (ref 1.7–7.7)
Neutrophils Relative %: 57 %
Platelet Count: 318 10*3/uL (ref 150–400)
RBC: 4.39 MIL/uL (ref 3.87–5.11)
RDW: 13.6 % (ref 11.5–15.5)
WBC Count: 5.8 10*3/uL (ref 4.0–10.5)
nRBC: 0 % (ref 0.0–0.2)

## 2019-04-20 LAB — CMP (CANCER CENTER ONLY)
ALT: 9 U/L (ref 0–44)
AST: 14 U/L — ABNORMAL LOW (ref 15–41)
Albumin: 3.9 g/dL (ref 3.5–5.0)
Alkaline Phosphatase: 54 U/L (ref 38–126)
Anion gap: 9 (ref 5–15)
BUN: 8 mg/dL (ref 6–20)
CO2: 26 mmol/L (ref 22–32)
Calcium: 8.6 mg/dL — ABNORMAL LOW (ref 8.9–10.3)
Chloride: 105 mmol/L (ref 98–111)
Creatinine: 0.83 mg/dL (ref 0.44–1.00)
GFR, Est AFR Am: >60 mL/min (ref >60)
GFR, Estimated: >60 mL/min (ref >60)
Glucose, Bld: 114 mg/dL — ABNORMAL HIGH (ref 70–99)
Potassium: 4.1 mmol/L (ref 3.5–5.1)
Sodium: 140 mmol/L (ref 135–145)
Total Bilirubin: 0.2 mg/dL — ABNORMAL LOW (ref 0.3–1.2)
Total Protein: 7.3 g/dL (ref 6.5–8.1)

## 2019-04-20 LAB — RETIC PANEL
Immature Retic Fract: 7.9 % (ref 2.3–15.9)
RBC.: 4.4 MIL/uL (ref 3.87–5.11)
Retic Count, Absolute: 61.2 10*3/uL (ref 19.0–186.0)
Retic Ct Pct: 1.4 % (ref 0.4–3.1)
Reticulocyte Hemoglobin: 35.1 pg (ref >27.9)

## 2019-04-20 LAB — IRON AND TIBC
Iron: 29 ug/dL — ABNORMAL LOW (ref 41–142)
Saturation Ratios: 9 % — ABNORMAL LOW (ref 21–57)
TIBC: 331 ug/dL (ref 236–444)
UIBC: 302 ug/dL (ref 120–384)

## 2019-04-20 LAB — FERRITIN: Ferritin: 7 ng/mL — ABNORMAL LOW (ref 11–307)

## 2019-04-20 NOTE — Progress Notes (Signed)
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 Started ferrous sulfate 325mg  BID 2) 03/18/2017: Fecal occult blood cards x 3 negative.  3) 07/2017: received 2 doses of feraheme 510mg  4) 12/21/2018: Iron 45, Sat 12.2, ferritin 6.5, Hgb 13.0 5) 01/10/2019: establish care with Dr. 12/23/2018. WBC 6.3, Hgb 12.8, Plt 315, MCV 87.5. Iron 36, TIBC 335, Sat 11%, Ferritin 6 6) 04/20/2019: WBC 5.8, Hgb 13.1, MCV 91.3, Plt 318  Interval History:  Beth Rivera 49 y.o. female with medical history significant for iron deficiency without anemia who presents for a follow up visit. The patient's last visit was on 01/10/2019. In the interim since the last visit the patient has been taking 1/2 pill of iron per day.   On exam today Beth Rivera notes that she feels well.  She reports that there has been an increase in her energy levels, but this has been associated with taking gabapentin which is helped her sleep longer due to her trigeminal neuralgia.  She reportedly is taking 600 mg 3 times daily at this time.  She reports that she has been taking iron and not as prescribed, but with 1/2 pill/day.  She has been taking it in the morning, but often is taking this with coffee.  The patient notes that she is not increased her meat intake and that she is predominantly eating a plant-based diet at this time.  She does endorse having dark stools with some occasional constipation, but that this has been tolerable.  Of note the patient is still having active menstrual cycles but notes that they are not been any  heavy periods since at least 5 years ago.  She notes that she is not having any issues with ice cravings, leg cramps, shortness of breath, or decreased energy.  Otherwise she denies any overt signs of bleeding.  A full 10 point ROS is listed below.  MEDICAL HISTORY:  Past Medical History:  Diagnosis Date  . Asthma   . Chronic cholecystitis   . Closed fracture of phalanx of left fifth toe with delayed healing 12/14/2017  . Complication of anesthesia   . Cough LAST 3 WEEKS   CLEAR SPUTUM, NO FEVER   . Depression   . Eczema   . Family history of adverse reaction to anesthesia    SON HAS PONV  . Guttate psoriasis 1993  . IgA deficiency (HCC) 2019  . Neck pain   . PMS (premenstrual syndrome)   . PONV (postoperative nausea and vomiting)   . Poor sleep pattern   . Psoriasis    GUTATE, MOSTLY ON LIMBS  . Recurrent upper respiratory infection (URI)   . Shoulder pain   . Sinus mucosal thickening   . Tinnitus    BOTH EARS  . Trigeminal neuralgia 03/11/2017    SURGICAL HISTORY: Past Surgical History:  Procedure Laterality Date  . CHOLECYSTECTOMY N/A 02/03/2015   Procedure: LAPAROSCOPIC CHOLECYSTECTOMY WITH INTRAOPERATIVE CHOLANGIOGRAM;  Surgeon: 05/11/2017, MD;  Location: [facility];  Service: General;  Laterality: N/A;  . WISDOM TOOTH EXTRACTION      SOCIAL HISTORY: Social History   Socioeconomic History  . Marital status: Married    Spouse  name: Not on file  . Number of children: 2  . Years of education: BA  . Highest education level: Not on file  Occupational History  . Not on file  Tobacco Use  . Smoking status: Never Smoker  . Smokeless tobacco: Never Used  Substance and Sexual Activity  . Alcohol use: Yes    Alcohol/week: 2.0 - 4.0 standard drinks    Types: 2 - 4 Standard drinks or equivalent per week    Comment: OCCASIONAL  . Drug use: No  . Sexual activity: Yes    Partners: Male    Birth control/protection: Surgical    Comment: husband vasectomy   Other Topics  Concern  . Not on file  Social History Narrative   Fun: Biking, hiking and gardening.   Denies abuse and feels safe at home.    Drinks 1 cup of coffee and green tea a day    Social Determinants of Corporate investment banker Strain:   . Difficulty of Paying Living Expenses:   Food Insecurity:   . Worried About Programme researcher, broadcasting/film/video in the Last Year:   . Barista in the Last Year:   Transportation Needs:   . Freight forwarder (Medical):   Marland Kitchen Lack of Transportation (Non-Medical):   Physical Activity:   . Days of Exercise per Week:   . Minutes of Exercise per Session:   Stress:   . Feeling of Stress :   Social Connections:   . Frequency of Communication with Friends and Family:   . Frequency of Social Gatherings with Friends and Family:   . Attends Religious Services:   . Active Member of Clubs or Organizations:   . Attends Banker Meetings:   Marland Kitchen Marital Status:   Intimate Partner Violence:   . Fear of Current or Ex-Partner:   . Emotionally Abused:   Marland Kitchen Physically Abused:   . Sexually Abused:     FAMILY HISTORY: Family History  Problem Relation Age of Onset  . Heart disease Paternal Grandfather   . Diabetes Paternal Grandfather   . Heart disease Paternal Grandmother   . Diabetes Paternal Grandmother   . Obesity Father   . CAD Father        3 stents  . Thyroid disease Mother   . Hepatitis C Mother        contracted from blood transfusion  . Skin cancer Mother   . Thyroid disease Maternal Aunt   . Breast cancer Maternal Aunt   . Thyroid disease Maternal Aunt   . Other Paternal Aunt        joint problems  . Depression Brother     ALLERGIES:  is allergic to other.  MEDICATIONS:  Current Outpatient Medications  Medication Sig Dispense Refill  . buPROPion (WELLBUTRIN XL) 150 MG 24 hr tablet Take 1 tablet (150 mg total) by mouth daily. 90 tablet 1  . cyclobenzaprine (FLEXERIL) 10 MG tablet TAKE 1 TABLET BY MOUTH AT BEDTIME AS NEEDED FOR MUSCLE  SPASMS 30 tablet 5  . Ferrous Sulfate (IRON) 325 (65 Fe) MG TABS Take 1 tablet (325 mg total) by mouth 2 (two) times daily with a meal. 30 each 0  . fluticasone (FLONASE) 50 MCG/ACT nasal spray Place 2 sprays into both nostrils daily. 16 g 6  . gabapentin (NEURONTIN) 300 MG capsule Take by mouth.    . Magnesium Gluconate 550 MG TABS Take by mouth.    . Vitamin D, Ergocalciferol, (DRISDOL) 1.25  MG (50000 UNIT) CAPS capsule TAKE 1 CAPSULE (50,000 UNITS TOTAL) BY MOUTH EVERY 7 (SEVEN) DAYS. 12 capsule 0   Current Facility-Administered Medications  Medication Dose Route Frequency Provider Last Rate Last Admin  . 0.9 %  sodium chloride infusion  500 mL Intravenous Once Meryl Dare, MD        REVIEW OF SYSTEMS:   Constitutional: ( - ) fevers, ( - )  chills , ( - ) night sweats Eyes: ( - ) blurriness of vision, ( - ) double vision, ( - ) watery eyes Ears, nose, mouth, throat, and face: ( - ) mucositis, ( - ) sore throat Respiratory: ( - ) cough, ( - ) dyspnea, ( - ) wheezes Cardiovascular: ( - ) palpitation, ( - ) chest discomfort, ( - ) lower extremity swelling Gastrointestinal:  ( - ) nausea, ( - ) heartburn, ( - ) change in bowel habits Skin: ( - ) abnormal skin rashes Lymphatics: ( - ) new lymphadenopathy, ( - ) easy bruising Neurological: ( - ) numbness, ( - ) tingling, ( - ) new weaknesses Behavioral/Psych: ( - ) mood change, ( - ) new changes  All other systems were reviewed with the patient and are negative.  PHYSICAL EXAMINATION: ECOG PERFORMANCE STATUS: 0 - Asymptomatic  Vitals:   04/20/19 1035  BP: 125/85  Pulse: 81  Resp: 18  Temp: 98.3 F (36.8 C)  SpO2: 100%   Filed Weights   04/20/19 1035  Weight: 136 lb 1.6 oz (61.7 kg)    GENERAL: well appearing middle aged Caucasian female in NAD  SKIN: skin color, texture, turgor are normal, no rashes or significant lesions EYES: conjunctiva are pink and non-injected, sclera clear LUNGS: clear to auscultation and  percussion with normal breathing effort HEART: regular rate & rhythm and no murmurs and no lower extremity edema Musculoskeletal: no cyanosis of digits and no clubbing  PSYCH: alert & oriented x 3, fluent speech NEURO: no focal motor/sensory deficits   LABORATORY DATA:  I have reviewed the data as listed CBC Latest Ref Rng & Units 04/20/2019 01/10/2019 12/21/2018  WBC 4.0 - 10.5 K/uL 5.8 6.3 6.5  Hemoglobin 12.0 - 15.0 g/dL 09.3 23.5 57.3  Hematocrit 36.0 - 46.0 % 40.1 38.6 38.6  Platelets 150 - 400 K/uL 318 315 349.0    CMP Latest Ref Rng & Units 04/20/2019 01/10/2019 06/26/2018  Glucose 70 - 99 mg/dL 220(U) 89 542(H)  BUN 6 - 20 mg/dL 8 9 11   Creatinine 0.44 - 1.00 mg/dL 0.62 3.76  Sodium 135 - 145 mmol/L 140 139 139  Potassium 3.5 - 5.1 mmol/L 4.1 4.0 3.4(L)  Chloride 98 - 111 mmol/L 105 106 106  CO2 22 - 32 mmol/L 26 26 21(L)  Calcium 8.9 - 10.3 mg/dL 2.83) 8.3(L) 8.7(L)  Total Protein 6.5 - 8.1 g/dL 7.3 7.2 -  Total Bilirubin 0.3 - 1.2 mg/dL 1.5(V) 0.3 -  Alkaline Phos 38 - 126 U/L 54 54 -  AST 15 - 41 U/L 14(L) 16 -  ALT 0 - 44 U/L 9 10 -    RADIOGRAPHIC STUDIES: None relevant to review.  No results found.  ASSESSMENT & PLAN Beth Rivera 48 y.o. female with medical history significant for iron deficiency without anemia who presents for a follow up visit.  After review the labs and discussed with the patient her findings are most consistent with an iron deficiency without anemia secondary to poor p.o. intake and previous chronic blood loss.  Unfortunately on this review it appears that the patient's iron levels have not improved and that her hemoglobin remained stable within the normal range.  The treatment of iron deficiency without anemia is controversial.  Generally patients are asymptomatic or minimally symptomatic treatment is not recommended.  However in her case I would recommend p.o. supplementation.  There is no clear indication for IV repletion in the setting  of no anemia.  At this time would recommend that we continue to monitor her levels and give can strong consideration to IV therapy in the event she were to become anemic or have pronounced symptoms.  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 No clear indication for IV iron in the absence of anemia --Possible source of the patient's iron deficiency is her prior heavy menstrual cycles, however these resolved approximately 5 years ago.  She is not having dark stools or any other signs of GI bleeding prior to the initiation of p.o. iron therapy.  It is possible that the patient has very low dietary intake due to her predominantly plant-based diet. --RTC in 6 months time to further assess. If the patient becomes anemic as a result of iron deficiency we can consider starting IV iron.   No orders of the defined types were placed in this encounter.   All questions were answered. The patient knows to call the clinic with any problems, questions or concerns.  A total of more than 30 minutes were spent on this encounter and over half of that time was spent on counseling and coordination of care as outlined above.   Beth Peoples, MD Department of Hematology/Oncology [facility] Phone: [phone] Pager: [phone] Email: [email or web address]  04/20/2019 11:20 AM   Literature Support:  Soppi ET. Iron deficiency without anemia - a clinical challenge. Clin Case Rep. 2018 Apr 17;6(6):1082-1086.  --Although there are some recent meta-analyses on the use of iron supplementation by patients with iron deficiency but without iron deficiency anemia the benefits of iron treatment remain equivocal.

## 2019-04-22 ENCOUNTER — Other Ambulatory Visit: Payer: Self-pay | Admitting: Family Medicine

## 2019-04-22 ENCOUNTER — Encounter: Payer: Self-pay | Admitting: Hematology and Oncology

## 2019-04-22 DIAGNOSIS — F338 Other recurrent depressive disorders: Secondary | ICD-10-CM

## 2019-04-23 ENCOUNTER — Telehealth: Payer: Self-pay | Admitting: *Deleted

## 2019-04-23 NOTE — Telephone Encounter (Signed)
-----   Message from Jaci Standard, MD sent at 04/23/2019  9:18 AM EDT ----- Please let Mrs. Kussman know that her iron levels have not improved since her last visit. Her Hgb is still normal and stable. I would recommend she continue taking the oral iron and consider taking 1 pill every other day (as opposed to 1/2 pill daily). We will see her back in 6 months or sooner if she is having symptoms.  [name]  ----- Message ----- From: Leory Plowman, Lab In [place] Sent: 04/20/2019   8:07 AM EDT To: Jaci Standard, MD

## 2019-04-23 NOTE — Telephone Encounter (Signed)
TCT patient regarding lab results from last week. Spoke with her and advised that her iron levels have not improved but that her HGB is normal and stable. Advised that Dr. Leonides Schanz would like her to try to take one whole  oral iron supplement tablet every other day,  instead of 1/2 tablet daily. Reviewed what to do about the constipation she experiences with oral iron. She voiced understanding and will take 1 full tablet every other day to start and may increase to daily as long as she can handle the constipation component.

## 2019-04-24 ENCOUNTER — Telehealth: Payer: Self-pay | Admitting: Hematology and Oncology

## 2019-04-24 NOTE — Telephone Encounter (Signed)
Scheduled per los. Called and left msg. Mailed printout  °

## 2019-05-04 ENCOUNTER — Ambulatory Visit: Payer: 59 | Admitting: Internal Medicine

## 2019-05-04 ENCOUNTER — Encounter: Payer: Self-pay | Admitting: Internal Medicine

## 2019-05-04 ENCOUNTER — Other Ambulatory Visit: Payer: Self-pay

## 2019-05-04 DIAGNOSIS — J449 Chronic obstructive pulmonary disease, unspecified: Secondary | ICD-10-CM | POA: Diagnosis not present

## 2019-05-04 DIAGNOSIS — G4710 Hypersomnia, unspecified: Secondary | ICD-10-CM | POA: Diagnosis not present

## 2019-05-04 MED ORDER — CLONAZEPAM 0.5 MG PO TBDP: ORAL_TABLET | ORAL | 3 refills | Status: DC

## 2019-05-04 MED ORDER — FLOVENT HFA 110 MCG/ACT IN AERO: INHALATION_SPRAY | RESPIRATORY_TRACT | 12 refills | Status: DC

## 2019-05-04 MED ORDER — ALBUTEROL SULFATE HFA 108 (90 BASE) MCG/ACT IN AERS: INHALATION_SPRAY | RESPIRATORY_TRACT | 12 refills | Status: DC

## 2019-05-04 NOTE — Patient Instructions (Addendum)
Suggest  1) Scripts sent for Flovent 110   Inhale 2 puffs, then rinse mouth, twice daily                               ProAir hfa "rescue" albuterol inhaler to use up to every 6 hirs if needed for wheezing/ shortness of breath  2) Try adding Melatonin 5 mg, or so, about 30-60 minutes before your intended bedtime, on a routine daily basis. This may help your brain settle more clearly on a regular schedule.  3) Script sent to try clonazepam 0.5 mg 1-3 tabs, about 30-60 minutes before bed. See if this helps you sleep longer and more soundly, without feeling over medicated.  Please cal as needed

## 2019-05-04 NOTE — Progress Notes (Signed)
05/04/19- 49 yo married F never smoker for sleep evaluation referred by Dr Hassan Rowan, feeling fatigue, wakes 2-4 times a night, has had 4 sleep studies in the past for Insomnia, Fatigue, Medical problem list includes Allergic Rhinitis, Asthma, Trigeminal Neuralgia, Psoriasis, FE def Anemia, IgA deficiency, Seasonal Affective Disorder,  Aggie Cosier, MD, Psychiatry- follows for Trigeminal Neuralgia. Gabapentin 600 mg TID for Trigeminal Neuralia pain, helps her sleep longer.. Wellbutrin, Flexeril,  [facility] 01/29/14 (Dr Earl Gala)- REI 5.4/ hr, desat to 82%. HST 2018- AHI 4/ hr She states sh was never a good sleeper, with no regular bedtime since about 49yo. Was in a pattern of falling asleep ok around 11PM but only sleeping about 3-4 hours, with tossing, turning, muscle cramps in legs, sore shoulder, numbness in feet and hands. Then so tired in day she needs 2-3 hour nap around 2-4:00PM.  She particularly minds this need for extended nap which is socially disruptive.  Taking Fexeril and gabapentin has helped some, extending nighttime sleep to 5-6 hours at best, but not clearing need to nap. No specific sleep meds. 1 cup AM coffee.  She particularly wants extended night time sleep with no need to nap.  Some light snoring. Previous sleep studies have shown respiratory disturbance around upper limit of normal. No ENT surgery.      Additional problem. History of mild asthma. Inhalers have expired- not paying attention to chronic dry cough.  Seasonal allergic rhinitis.  Prior to Admission medications   Medication Sig Start Date End Date Taking? Authorizing Provider  buPROPion (WELLBUTRIN XL) 150 MG 24 hr tablet TAKE 1 TABLET BY MOUTH EVERY DAY 04/23/19  Yes Koberlein, Junell C, MD  Cyanocobalamin (VITAMIN B12 PO) Take by mouth. 3 times a week   Yes [provider]  cyclobenzaprine (FLEXERIL) 10 MG tablet TAKE 1 TABLET BY MOUTH AT BEDTIME AS NEEDED FOR MUSCLE SPASMS 12/15/18  Yes Koberlein, Junell C,  MD  Ferrous Sulfate (IRON) 325 (65 Fe) MG TABS Take 1 tablet (325 mg total) by mouth 2 (two) times daily with a meal. 03/02/17  Yes Meryl Dare, MD  gabapentin (NEURONTIN) 300 MG capsule Take 300 mg by mouth. Taking 6- 7 tabs per day 11/10/17 05/04/19 Yes [provider]  Magnesium Gluconate 550 MG TABS Take by mouth.   Yes [provider]  Vitamin D, Ergocalciferol, (DRISDOL) 1.25 MG (50000 UNIT) CAPS capsule TAKE 1 CAPSULE (50,000 UNITS TOTAL) BY MOUTH EVERY 7 (SEVEN) DAYS. 03/14/19  Yes Koberlein, Paris Lore, MD  albuterol (PROAIR HFA) 108 (90 Base) MCG/ACT inhaler Inhale 2 puffs every 6 hours IF NEEDED for wheeze/ shortness of breath 05/04/19   Jetty Duhamel D, MD  clonazePAM (KLONOPIN) 0.5 MG disintegrating tablet 1-3 for sleep as needed 05/04/19   Jetty Duhamel D, MD  fluticasone (FLONASE) 50 MCG/ACT nasal spray Place 2 sprays into both nostrils daily. Patient not taking: Reported on 05/04/2019 03/21/17   Evaristo Bury, NP  fluticasone [facility] HFA) 110 MCG/ACT inhaler Inhale 2 puffs, then rinse moth, twice daily- maintenance 05/04/19   Waymon Budge, MD   Past Medical History:  Diagnosis Date  . Asthma   . Chronic cholecystitis   . Closed fracture of phalanx of left fifth toe with delayed healing 12/14/2017  . Complication of anesthesia   . Cough LAST 3 WEEKS   CLEAR SPUTUM, NO FEVER   . Depression   . Eczema   . Family history of adverse reaction to anesthesia    SON HAS PONV  .  Guttate psoriasis 1993  . IgA deficiency (HCC) 2019  . Neck pain   . PMS (premenstrual syndrome)   . PONV (postoperative nausea and vomiting)   . Poor sleep pattern   . Psoriasis    GUTATE, MOSTLY ON LIMBS  . Recurrent upper respiratory infection (URI)   . Shoulder pain   . Sinus mucosal thickening   . Tinnitus    BOTH EARS  . Trigeminal neuralgia 03/11/2017   Past Surgical History:  Procedure Laterality Date  . CHOLECYSTECTOMY N/A 02/03/2015   Procedure: LAPAROSCOPIC  CHOLECYSTECTOMY WITH INTRAOPERATIVE CHOLANGIOGRAM;  Surgeon: Darnell Level, MD;  Location: [facility];  Service: General;  Laterality: N/A;  . WISDOM TOOTH EXTRACTION     Family History  Problem Relation Age of Onset  . Heart disease Paternal Grandfather   . Diabetes Paternal Grandfather   . Heart disease Paternal Grandmother   . Diabetes Paternal Grandmother   . Obesity Father   . CAD Father        3 stents  . Thyroid disease Mother   . Hepatitis C Mother        contracted from blood transfusion  . Skin cancer Mother   . Thyroid disease Maternal Aunt   . Breast cancer Maternal Aunt   . Thyroid disease Maternal Aunt   . Other Paternal Aunt        joint problems  . Depression Brother    Social History   Socioeconomic History  . Marital status: Married    Spouse name: Not on file  . Number of children: 2  . Years of education: BA  . Highest education level: Not on file  Occupational History  . Not on file  Tobacco Use  . Smoking status: Never Smoker  . Smokeless tobacco: Never Used  Substance and Sexual Activity  . Alcohol use: Yes    Alcohol/week: 2.0 - 4.0 standard drinks    Types: 2 - 4 Standard drinks or equivalent per week    Comment: OCCASIONAL  . Drug use: No  . Sexual activity: Yes    Partners: Male    Birth control/protection: Surgical    Comment: husband vasectomy   Other Topics Concern  . Not on file  Social History Narrative   Fun: Biking, hiking and gardening.   Denies abuse and feels safe at home.    Drinks 1 cup of coffee and green tea a day    Social Determinants of Corporate investment banker Strain:   . Difficulty of Paying Living Expenses:   Food Insecurity:   . Worried About Programme researcher, broadcasting/film/video in the Last Year:   . Barista in the Last Year:   Transportation Needs:   . Freight forwarder (Medical):   Marland Kitchen Lack of Transportation (Non-Medical):   Physical Activity:   . Days of Exercise per Week:   . Minutes of Exercise per Session:    Stress:   . Feeling of Stress :   Social Connections:   . Frequency of Communication with Friends and Family:   . Frequency of Social Gatherings with Friends and Family:   . Attends Religious Services:   . Active Member of Clubs or Organizations:   . Attends Banker Meetings:   Marland Kitchen Marital Status:   Intimate Partner Violence:   . Fear of Current or Ex-Partner:   . Emotionally Abused:   Marland Kitchen Physically Abused:   . Sexually Abused:    ROS-see HPI   + =  positive Constitutional:    weight loss, night sweats, fevers, chills,+ fatigue, lassitude. HEENT:    headaches, difficulty swallowing, tooth/dental problems, sore throat,       sneezing, itching, ear ache, +nasal congestion, post nasal drip, +snoring CV:    chest pain, orthopnea, PND, swelling in lower extremities, anasarca,                                  dizziness, palpitations Resp:   +shortness of breath with exertion or at rest.               + productive cough,   +non-productive cough, coughing up of blood.              change in color of mucus.  +wheezing.   Skin:    rash or lesions. GI:  No-   heartburn, indigestion, abdominal pain, nausea, vomiting, diarrhea,                 change in bowel habits, loss of appetite GU: dysuria, change in color of urine, no urgency or frequency.   flank pain. MS:   +joint pain, stiffness, decreased range of motion, back pain. Neuro-     nothing unusual Psych:  change in mood or affect. + depression or +anxiety.   memory loss.  OBJ- Physical Exam General- Alert, Oriented, Affect-appropriate, +briefly tearful, Distress- none acute, + trim Skin- rash-none, lesions- none, excoriation- none Lymphadenopathy- none Head- atraumatic            Eyes- Gross vision intact, PERRLA, conjunctivae and secretions clear            Ears- Hearing, canals-normal            Nose- Clear, no-Septal dev, mucus, polyps, erosion, perforation             Throat- Mallampati II , mucosa clear , drainage-  none, tonsils- atrophic Neck- flexible , trachea midline, no stridor , thyroid nl, carotid no bruit Chest - symmetrical excursion , unlabored           Heart/CV- RRR , no murmur , no gallop  , no rub, nl s1 s2                           - JVD- none , edema- none, stasis changes- none, varices- none           Lung-  wheeze+ light, cough- none , dullness-none, rub- none           Chest wall-  Abd-  Br/ Gen/ Rectal- Not done, not indicated Extrem- cyanosis- none, clubbing, none, atrophy- none, strength- nl Neuro- grossly intact to observation

## 2019-05-04 NOTE — Assessment & Plan Note (Signed)
Long-standing. She let her inhalers expire and hasn't been paying attention, but wheezing on exam today. Plan- resume Flovent 110 maintenance inhaler, Proair rescue inhaler, with discussion.

## 2019-05-04 NOTE — Assessment & Plan Note (Signed)
Complex sleep complaints. Her primary goal is to stop need for disruptive, prolonged afternoon naps. She has long hx of poor sleep partly due to physical discomfort. Not sure if she may have some impingement joint problems to cause shoulder pain and extremity numbness in bed. Probable component of poor sleep hygiene- discussed environment and schedule. Probably magnified by hx anxiety and depression with psychophysiologic insomnia. Plan- We will try first to better consolidate night time sleep, help sleep-time performance anxiety, and help somatic relaxation with clonazepam. Hope to wean away flexeril/ gabapentin, depending on results. Try using melatonin to reinforce night time sleep schedule. Consider cognitive behavioral therapy. Consider caffeine at lunch time to help with siesta schedule napping once night time sleep improved.

## 2019-06-20 ENCOUNTER — Encounter: Payer: Self-pay | Admitting: Family Medicine

## 2019-08-03 ENCOUNTER — Other Ambulatory Visit: Payer: Self-pay

## 2019-08-03 ENCOUNTER — Ambulatory Visit: Payer: No Typology Code available for payment source | Admitting: Internal Medicine

## 2019-08-03 ENCOUNTER — Encounter: Payer: Self-pay | Admitting: Internal Medicine

## 2019-08-03 DIAGNOSIS — G4710 Hypersomnia, unspecified: Secondary | ICD-10-CM

## 2019-08-03 MED ORDER — AMPHETAMINE-DEXTROAMPHETAMINE 10 MG PO TABS: ORAL_TABLET | ORAL | 0 refills | Status: DC

## 2019-08-03 NOTE — Progress Notes (Signed)
05/04/19- 49 yo married F never smoker for sleep evaluation referred by Dr Hassan Rowan, feeling fatigue, wakes 2-4 times a night, has had 4 sleep studies in the past for Insomnia, Fatigue, Medical problem list includes Allergic Rhinitis, Asthma, Trigeminal Neuralgia, Psoriasis, FE def Anemia, IgA deficiency, Seasonal Affective Disorder,  Aggie Cosier, MD, Psychiatry- follows for Trigeminal Neuralgia. Gabapentin 600 mg TID for Trigeminal Neuralia pain, helps her sleep longer.. Wellbutrin, Flexeril,  [facility] 01/29/14 (Dr Earl Gala)- REI 5.4/ hr, desat to 82%. HST 2018- AHI 4/ hr She states sh was never a good sleeper, with no regular bedtime since about 49yo. Was in a pattern of falling asleep ok around 11PM but only sleeping about 3-4 hours, with tossing, turning, muscle cramps in legs, sore shoulder, numbness in feet and hands. Then so tired in day she needs 2-3 hour nap around 2-4:00PM.  She particularly minds this need for extended nap which is socially disruptive.  Taking Fexeril and gabapentin has helped some, extending nighttime sleep to 5-6 hours at best, but not clearing need to nap. No specific sleep meds. 1 cup AM coffee.  She particularly wants extended night time sleep with no need to nap.  Some light snoring. Previous sleep studies have shown respiratory disturbance around upper limit of normal. No ENT surgery.      Additional problem. History of mild asthma. Inhalers have expired- not paying attention to chronic dry cough.  Seasonal allergic rhinitis.  08/03/19- 49 yo married F never smoker followed for Insomnia, Fatigue, Asthma, wakes 2-4 times a night, has had 4 sleep studies in the past for Insomnia, Fatigue, Complicated by  Allergic Rhinitis, Asthma, Trigeminal Neuralgia, Psoriasis, FE def Anemia, IgA deficiency, Seasonal Affective Disorder, Aggie Cosier, MD, Psychiatry- follows for Trigeminal Neuralgia. Gabapentin 600 mg TID for Trigeminal Neuralia pain, helps her sleep  longer.. Wellbutrin, Flexeril, Flovent 110 HFA, Proair, Melatonin 5, Clonazepam 0.5 mg  Previous sleep studies have shown respiratory disturbance around upper limit of normal. -----REM sleep is not as good as before.  many mood swings that she is concerned about  Had 2 Phizer Covax Bettter sleep with 1 0.5 mg clonazepam- getss 5-7 hours.  Her FitBit estimates less REM. She denies nightmares, but says every 3-8 days she has 1-2 days of ccrying spells. Not currently working with psychiatrist, but was recently given referal.  She remains concerned about perceived need for naps.   ROS-see HPI   + = positive Constitutional:    weight loss, night sweats, fevers, chills,+ fatigue, lassitude. HEENT:    headaches, difficulty swallowing, tooth/dental problems, sore throat,       sneezing, itching, ear ache, +nasal congestion, post nasal drip, +snoring CV:    chest pain, orthopnea, PND, swelling in lower extremities, anasarca,                                   dizziness, palpitations Resp:   +shortness of breath with exertion or at rest.               + productive cough,   +non-productive cough, coughing up of blood.              change in color of mucus.  +wheezing.   Skin:    rash or lesions. GI:  No-   heartburn, indigestion, abdominal pain, nausea, vomiting, diarrhea,  change in bowel habits, loss of appetite GU: dysuria, change in color of urine, no urgency or frequency.   flank pain. MS:   +joint pain, stiffness, decreased range of motion, back pain. Neuro-     nothing unusual Psych:  change in mood or affect. + depression or +anxiety.   memory loss.  OBJ- Physical Exam General- Alert, Oriented, Affect-appropriate, Distress- none acute, + trim Skin- rash-none, lesions- none, excoriation- none Lymphadenopathy- none Head- atraumatic            Eyes- Gross vision intact, PERRLA, conjunctivae and secretions clear            Ears- Hearing, canals-normal            Nose- Clear,  no-Septal dev, mucus, polyps, erosion, perforation             Throat- Mallampati II , mucosa clear , drainage- none, tonsils- atrophic Neck- flexible , trachea midline, no stridor , thyroid nl, carotid no bruit Chest - symmetrical excursion , unlabored           Heart/CV- RRR , no murmur , no gallop  , no rub, nl s1 s2                           - JVD- none , edema- none, stasis changes- none, varices- none           Lung-  Wheeze- none, cough- none , dullness-none, rub- none           Chest wall-  Abd-  Br/ Gen/ Rectal- Not done, not indicated Extrem- cyanosis- none, clubbing, none, atrophy- none, strength- nl Neuro- grossly intact to observation

## 2019-08-03 NOTE — Patient Instructions (Signed)
Ok to try taking 1.5 tabs of clonazepam to see it htat helps sleep more.  Script sent to try adderall 10 mg tabs occasionally if needed for excessive daytime sleepiness.   Please call as needed

## 2019-08-19 ENCOUNTER — Encounter: Payer: Self-pay | Admitting: Internal Medicine

## 2019-08-19 NOTE — Assessment & Plan Note (Signed)
Her complaint is about need for naps. She describes crying spells and she looks active, not drowsy. Strong impression that a mental heal councellor  can help her more than I. We have discussed option of trying Adderall for daytime alertness to deal with siesta time sleepiness as long as she seems to get enough sleep at night. Discussed trial of Doxepin for sleep, but will wait for her to see psychiatry.  Ok instead to try clonazepam 1.5 mg at bedtime. Ok to nap if needed. Don't drive if drowsy.

## 2019-08-24 ENCOUNTER — Telehealth (INDEPENDENT_AMBULATORY_CARE_PROVIDER_SITE_OTHER): Payer: No Typology Code available for payment source | Admitting: Family Medicine

## 2019-08-24 ENCOUNTER — Encounter: Payer: Self-pay | Admitting: Family Medicine

## 2019-08-24 DIAGNOSIS — M792 Neuralgia and neuritis, unspecified: Secondary | ICD-10-CM

## 2019-08-24 DIAGNOSIS — F339 Major depressive disorder, recurrent, unspecified: Secondary | ICD-10-CM | POA: Diagnosis not present

## 2019-08-24 MED ORDER — DULOXETINE HCL 30 MG PO CPEP: 30.0000 mg | ORAL_CAPSULE | Freq: Every day | ORAL | 1 refills | Status: DC

## 2019-08-24 NOTE — Progress Notes (Signed)
Virtual Visit via Video Note  I connected with Beth Rivera  on 08/24/19 at 11:00 AM EDT by a video enabled telemedicine application and verified that I am speaking with the correct person using two identifiers.  Location patient: home Location provider: [facility]  [address] [place], [place] [address]  Persons participating in the virtual visit: patient, provider  I discussed the limitations of evaluation and management by telemedicine and the availability of in person appointments. The patient expressed understanding and agreed to proceed.   Beth Rivera DOB: 08/31/1970 Encounter date: 08/24/2019  This is a 49 y.o. female who presents with Chief Complaint  Patient presents with  . Depression    History of present illness: Patient is out on walk and about to go into part of neighborhood where she doesn't get good reception.   Since November, now getting nerve pain all over body - from face to limbs to trunk. Between mid feb and may 7th-8th had what started as basic depression - fatigue, social anxiety, irritability. Seemed progressive that with each episode would last a few days a time and last longer each time. Seemed to get worse with each episode - and get more intense. Crying daily. Hopelessness. Passive suicidal ideation. Wouldn't hurt self. Then feels very frustrated with episodes because she doesn't want to live but feels that this is her obligation to live. Just feels trapped. This is further than depressive experiences have ever been. Mother and grandmother moved in during June. Feels that emotions are similar to theirs and feels that she shouldn't be having sx that are this severe. Wonders if she is in perimenopausal stage.   Tried carbamazepine which didn't seem to do much.   Tried cymbalta for a little while, but worried about withdrawals with coming off of medication longer she was on it.   Now on gabapentin but it is short acting and can tell  when it wears off. Has been taking this since late January.   Also taking klonopin for sleep. She was worried about some of sx being from klonopin.   Not taken flexeril in last few weeks.   Also prescribed adderall so hasn't started this. States was told to use as needed. This was from sleep neuro. Was supposed to help her get back on sleep schedule.   Would like to get into psychiatry sooner than October ([facility]). Not doing therapy now. Quit about 6 mo before pandemic.   Wakes with sensation in hands/feet. Feels like wave and although she calls it numb, it hurts.  Allergies  Allergen Reactions  . Other     Seasonal, dogs   Current Meds  Medication Sig  . albuterol (PROAIR HFA) 108 (90 Base) MCG/ACT inhaler Inhale 2 puffs every 6 hours IF NEEDED for wheeze/ shortness of breath  . amphetamine-dextroamphetamine (ADDERALL) 10 MG tablet 1 or 2 daily as needed for sleepiness  . buPROPion (WELLBUTRIN XL) 150 MG 24 hr tablet TAKE 1 TABLET BY MOUTH EVERY DAY  . clonazePAM (KLONOPIN) 0.5 MG disintegrating tablet 1-3 for sleep as needed  . Cyanocobalamin (VITAMIN B12 PO) Take by mouth. 3 times a week  . Ferrous Sulfate (IRON) 325 (65 Fe) MG TABS Take 1 tablet (325 mg total) by mouth 2 (two) times daily with a meal.  . fluticasone (FLONASE) 50 MCG/ACT nasal spray Place 2 sprays into both nostrils daily.  . fluticasone (FLOVENT HFA) 110 MCG/ACT inhaler Inhale 2 puffs, then rinse moth, twice  daily- maintenance  . Magnesium Gluconate 550 MG TABS Take by mouth.  . Vitamin D, Ergocalciferol, (DRISDOL) 1.25 MG (50000 UNIT) CAPS capsule TAKE 1 CAPSULE (50,000 UNITS TOTAL) BY MOUTH EVERY 7 (SEVEN) DAYS.  . [DISCONTINUED] cyclobenzaprine (FLEXERIL) 10 MG tablet TAKE 1 TABLET BY MOUTH AT BEDTIME AS NEEDED FOR MUSCLE SPASMS   Current Facility-Administered Medications for the 08/24/19 encounter (Video Visit) with Wynn Banker, MD  Medication  . 0.9 %  sodium chloride  infusion    Review of Systems  Constitutional: Negative for chills, fatigue and fever.  Respiratory: Negative for cough, chest tightness, shortness of breath and wheezing.   Cardiovascular: Negative for chest pain, palpitations and leg swelling.  Neurological:       Neuralgias  Psychiatric/Behavioral:       See hpi    Objective:  There were no vitals taken for this visit.      BP Readings from Last 3 Encounters:  08/03/19 108/76  05/04/19 124/64  04/20/19 125/85   Wt Readings from Last 3 Encounters:  08/03/19 127 lb 6 oz (57.8 kg)  05/04/19 134 lb 6.4 oz (61 kg)  04/20/19 136 lb 1.6 oz (61.7 kg)    EXAM:  GENERAL: alert, oriented, appears well and in no acute distress  HEENT: atraumatic, conjunctiva clear, no obvious abnormalities on inspection of external nose and ears  NECK: normal movements of the head and neck  LUNGS: on inspection no signs of respiratory distress, breathing rate appears normal, no obvious gross SOB, gasping or wheezing  CV: no obvious cyanosis  MS: moves all visible extremities without noticeable abnormality  PSYCH/NEURO: pleasant and cooperative, mood has been depressed. No thoughts of self harm.   Assessment/Plan  1. Depression, recurrent (HCC) She is willing to retry Cymbalta.  She was hoping to get in sooner with psychiatry, but I do not think that we will be able to do this since backup time for psychiatry is pretty significant.  She did have good relief with mood in the past with Cymbalta.  Previously, she was on a 60 mg daily dose.  We will start with 30 mg daily and set up a follow-up visit in 2 to 3 weeks to touch base on symptoms.  I have asked her to let me know sooner if any worsening of mood.  She does understand that this medication can be difficult to come off of, so we will have to do a slow wean if needing to stop medication.  Goal would be decrease of the medications that she is using, so I am hopeful that with the Cymbalta she  will get mood benefit and sleep benefit as well as nerve pain benefit.  If this is the case, ideally, we would be able to cut back on the Klonopin, Adderall, gabapentin.  She is not taking the Flexeril at this point.  I also gave her number for therapy as well as name of Maggie Font so that she can get started with therapy.  2. Nerve pain See above.    Return in about 2 weeks (around 09/07/2019).   I discussed the assessment and treatment plan with the patient. The patient was provided an opportunity to ask questions and all were answered. The patient agreed with the plan and demonstrated an understanding of the instructions.   The patient was advised to call back or seek an in-person evaluation if the symptoms worsen or if the condition fails to improve as anticipated.  I provided 20 minutes of  non-face-to-face time during this encounter.   Theodis Shove, MD

## 2019-08-28 ENCOUNTER — Telehealth: Payer: Self-pay | Admitting: *Deleted

## 2019-08-28 NOTE — Telephone Encounter (Signed)
Left a detailed message at the pts cell number to schedule an appt as below.   

## 2019-08-28 NOTE — Telephone Encounter (Signed)
-----   Message from Wynn Banker, MD sent at 08/24/2019 12:55 PM EDT ----- Please set up follow up visit (virtual ok) in 2-3 weeks.

## 2019-10-21 ENCOUNTER — Other Ambulatory Visit: Payer: Self-pay | Admitting: Hematology and Oncology

## 2019-10-21 DIAGNOSIS — D500 Iron deficiency anemia secondary to blood loss (chronic): Secondary | ICD-10-CM

## 2019-10-21 NOTE — Progress Notes (Signed)
No show

## 2019-10-22 ENCOUNTER — Other Ambulatory Visit: Payer: 59

## 2019-10-22 ENCOUNTER — Encounter: Payer: Self-pay | Admitting: Internal Medicine

## 2019-10-22 ENCOUNTER — Encounter: Payer: 59 | Admitting: Hematology and Oncology

## 2019-10-22 ENCOUNTER — Other Ambulatory Visit: Payer: Self-pay

## 2019-10-22 ENCOUNTER — Other Ambulatory Visit: Payer: Self-pay | Admitting: Internal Medicine

## 2019-10-22 DIAGNOSIS — D500 Iron deficiency anemia secondary to blood loss (chronic): Secondary | ICD-10-CM

## 2019-10-22 MED ORDER — AMPHETAMINE-DEXTROAMPHETAMINE 10 MG PO TABS: ORAL_TABLET | ORAL | 0 refills | Status: AC

## 2019-10-22 NOTE — Telephone Encounter (Signed)
Pi is requesting refill CLONAZEPAM 0.5 MG DIS TABLET next ov 11/08/2019

## 2019-10-22 NOTE — Telephone Encounter (Signed)
Adderall refilled

## 2019-10-22 NOTE — Telephone Encounter (Signed)
Dr. Maple Hudson, please advise if you are okay refilling.  Allergies  Allergen Reactions  . Other     Seasonal, dogs     Current Outpatient Medications:  .  albuterol (PROAIR HFA) 108 (90 Base) MCG/ACT inhaler, Inhale 2 puffs every 6 hours IF NEEDED for wheeze/ shortness of breath, Disp: 18 g, Rfl: 12 .  amphetamine-dextroamphetamine (ADDERALL) 10 MG tablet, 1 or 2 daily as needed for sleepiness, Disp: 60 tablet, Rfl: 0 .  buPROPion (WELLBUTRIN XL) 150 MG 24 hr tablet, TAKE 1 TABLET BY MOUTH EVERY DAY, Disp: 90 tablet, Rfl: 0 .  clonazePAM (KLONOPIN) 0.5 MG disintegrating tablet, 1-3 for sleep as needed, Disp: 50 tablet, Rfl: 3 .  Cyanocobalamin (VITAMIN B12 PO), Take by mouth. 3 times a week, Disp: , Rfl:  .  DULoxetine (CYMBALTA) 30 MG capsule, Take 1 capsule (30 mg total) by mouth daily., Disp: 90 capsule, Rfl: 1 .  Ferrous Sulfate (IRON) 325 (65 Fe) MG TABS, Take 1 tablet (325 mg total) by mouth 2 (two) times daily with a meal., Disp: 30 each, Rfl: 0 .  fluticasone (FLONASE) 50 MCG/ACT nasal spray, Place 2 sprays into both nostrils daily., Disp: 16 g, Rfl: 6 .  fluticasone (FLOVENT HFA) 110 MCG/ACT inhaler, Inhale 2 puffs, then rinse moth, twice daily- maintenance, Disp: 1 Inhaler, Rfl: 12 .  gabapentin (NEURONTIN) 300 MG capsule, Take 300 mg by mouth. Taking 6- 7 tabs per day, Disp: , Rfl:  .  Magnesium Gluconate 550 MG TABS, Take by mouth., Disp: , Rfl:  .  Vitamin D, Ergocalciferol, (DRISDOL) 1.25 MG (50000 UNIT) CAPS capsule, TAKE 1 CAPSULE (50,000 UNITS TOTAL) BY MOUTH EVERY 7 (SEVEN) DAYS., Disp: 12 capsule, Rfl: 0  Current Facility-Administered Medications:  .  0.9 %  sodium chloride infusion, 500 mL, Intravenous, Once, Meryl Dare, MD

## 2019-10-22 NOTE — Telephone Encounter (Signed)
Clonazepam refilled 

## 2019-11-08 ENCOUNTER — Ambulatory Visit: Payer: No Typology Code available for payment source | Admitting: Internal Medicine

## 2019-11-08 NOTE — Progress Notes (Deleted)
HPI  married F never smoker followed for Insomnia, Fatigue, Asthma, wakes 2-4 times a night, has had 4 sleep studies in the past for Insomnia, Fatigue, Complicated by  Allergic Rhinitis, Asthma, Trigeminal Neuralgia, Psoriasis, FE def Anemia, IgA deficiency, Seasonal Affective Disorder, Aggie Cosier, MD, Psychiatry- follows for Trigeminal Neuralgia [facility] (Dr Earl Gala)- REI 5.4/ hr, desat to 82%. HST 2018- AHI 4/ hr  ==============================================================   08/03/19- 49 yo married F never smoker followed for Insomnia, Fatigue, Asthma, wakes 2-4 times a night, has had 4 sleep studies in the past for Insomnia, Fatigue, Complicated by  Allergic Rhinitis, Asthma, Trigeminal Neuralgia, Psoriasis, FE def Anemia, IgA deficiency, Seasonal Affective Disorder, Aggie Cosier, MD, Psychiatry- follows for Trigeminal Neuralgia. Gabapentin 600 mg TID for Trigeminal nin 5, Clonazepam 0.5 mg Neuralia pain, helps her sleep longer.. Wellbutrin, Flexeril, Flovent 110 HFA, Proair, Melatonin Previous sleep studies have shown respiratory disturbance around upper limit of normal. -----REM sleep is not as good as before.  many mood swings that she is concerned about  Had 2 Phizer Covax Bettter sleep with 1 0.5 mg clonazepam- getss 5-7 hours.  Her FitBit estimates less REM. She denies nightmares, but says every 3-8 days she has 1-2 days of ccrying spells. Not currently working with psychiatrist, but was recently given referal.  She remains concerned about perceived need for naps.   11/08/19- 49 yo married F never smoker followed for Insomnia, Fatigue, Asthma, wakes 2-4 times a night, has had 4 sleep studies in the past for Insomnia, Fatigue, Complicated by  Allergic Rhinitis, Asthma, Trigeminal Neuralgia, Psoriasis, FE def Anemia, IgA deficiency, Seasonal Affective Disorder, Aggie Cosier, MD, Psychiatry- follows for Trigeminal Neuralgia. Gabapentin 600 mg TID for Trigeminal nin 5,  Clonazepam 0.5 mg Neuralia pain, helps her sleep longer.. Wellbutrin, Flexeril, Flovent 110 HFA, Proair, Melatonin Previous sleep studies have shown respiratory disturbance around upper limit of normal. She particularly wants extended night time sleep with no need to nap.  Covid vax- Flu vax-   ROS-see HPI   + = positive Constitutional:    weight loss, night sweats, fevers, chills,+ fatigue, lassitude. HEENT:    headaches, difficulty swallowing, tooth/dental problems, sore throat,       sneezing, itching, ear ache, +nasal congestion, post nasal drip, +snoring CV:    chest pain, orthopnea, PND, swelling in lower extremities, anasarca,                                   dizziness, palpitations Resp:   +shortness of breath with exertion or at rest.               + productive cough,   +non-productive cough, coughing up of blood.              change in color of mucus.  +wheezing.   Skin:    rash or lesions. GI:  No-   heartburn, indigestion, abdominal pain, nausea, vomiting, diarrhea,                 change in bowel habits, loss of appetite GU: dysuria, change in color of urine, no urgency or frequency.   flank pain. MS:   +joint pain, stiffness, decreased range of motion, back pain. Neuro-     nothing unusual Psych:  change in mood or affect. + depression or +anxiety.   memory loss.  OBJ- Physical Exam General- Alert, Oriented, Affect-appropriate, Distress- none acute, +  trim Skin- rash-none, lesions- none, excoriation- none Lymphadenopathy- none Head- atraumatic            Eyes- Gross vision intact, PERRLA, conjunctivae and secretions clear            Ears- Hearing, canals-normal            Nose- Clear, no-Septal dev, mucus, polyps, erosion, perforation             Throat- Mallampati II , mucosa clear , drainage- none, tonsils- atrophic Neck- flexible , trachea midline, no stridor , thyroid nl, carotid no bruit Chest - symmetrical excursion , unlabored           Heart/CV- RRR , no  murmur , no gallop  , no rub, nl s1 s2                           - JVD- none , edema- none, stasis changes- none, varices- none           Lung-  Wheeze- none, cough- none , dullness-none, rub- none           Chest wall-  Abd-  Br/ Gen/ Rectal- Not done, not indicated Extrem- cyanosis- none, clubbing, none, atrophy- none, strength- nl Neuro- grossly intact to observation

## 2019-11-12 IMAGING — CR CHEST - 2 VIEW
2 series · 2 of 2 positions shown · non-contrast
Comparison: January 18, 2017

CLINICAL DATA: Chest pain and shortness of breath.  Cough.

EXAM:
CHEST - 2 VIEW

[w chest pa]
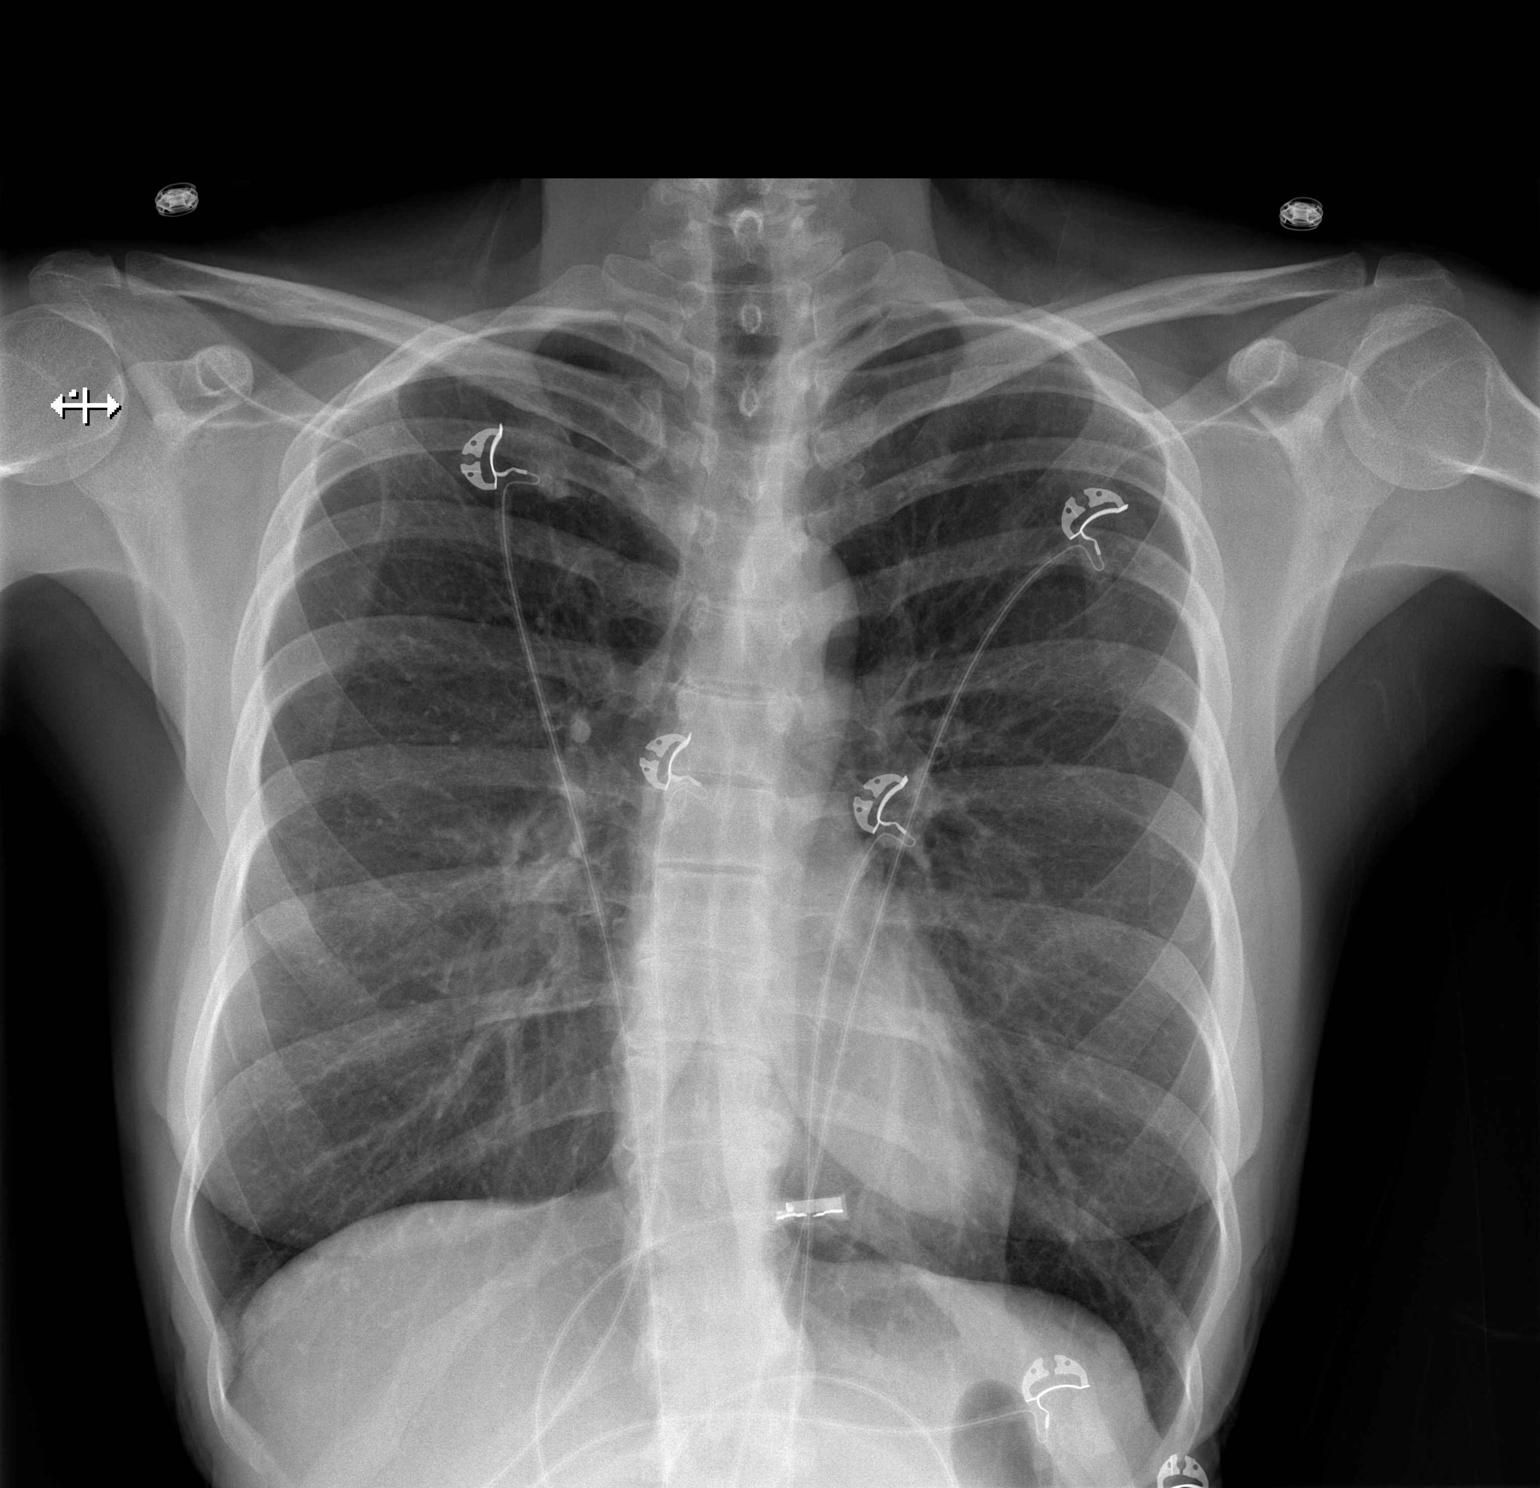

[w chest lat]
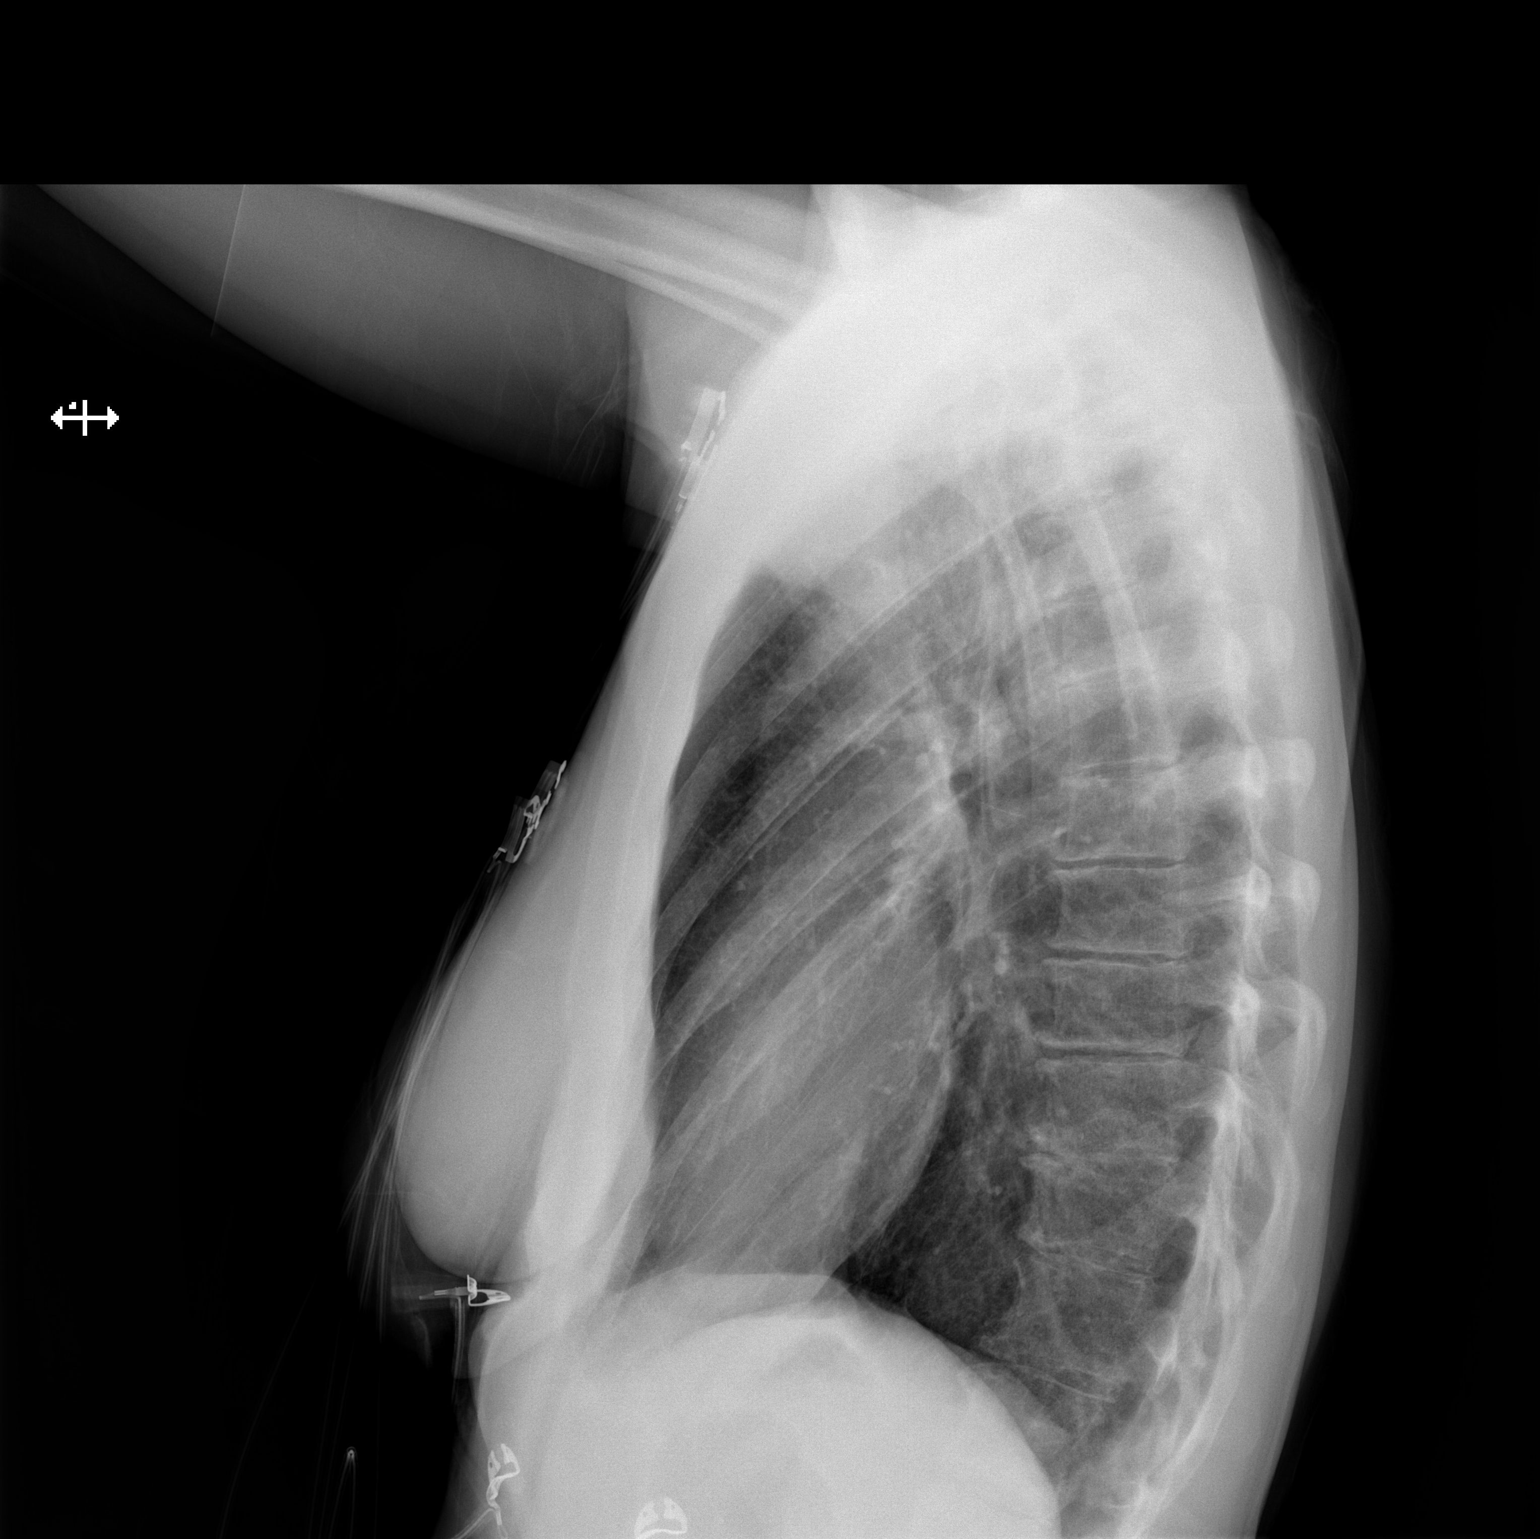

[2 of 2 positions shown; findings below may reference images not displayed]

FINDINGS: No edema or consolidation. Heart size and pulmonary vascularity are
normal. No adenopathy. There is lower thoracic dextroscoliosis. No
pneumothorax.
IMPRESSION: No edema or consolidation.

## 2019-12-16 ENCOUNTER — Other Ambulatory Visit: Payer: Self-pay | Admitting: Family Medicine

## 2019-12-16 DIAGNOSIS — G500 Trigeminal neuralgia: Secondary | ICD-10-CM

## 2019-12-17 NOTE — Telephone Encounter (Signed)
Last OV 08/24/19 Last refill - not on current med list Next OV not scheduled

## 2020-02-14 ENCOUNTER — Other Ambulatory Visit: Payer: Self-pay | Admitting: Family Medicine

## 2020-03-21 ENCOUNTER — Encounter: Payer: Self-pay | Admitting: Family Medicine

## 2020-03-21 DIAGNOSIS — J449 Chronic obstructive pulmonary disease, unspecified: Secondary | ICD-10-CM

## 2020-03-21 DIAGNOSIS — D802 Selective deficiency of immunoglobulin A [IgA]: Secondary | ICD-10-CM

## 2020-03-24 ENCOUNTER — Telehealth: Payer: Self-pay | Admitting: Adult Health

## 2020-03-24 NOTE — Telephone Encounter (Signed)
Called patient and LMOM to discuss Evusheld administration.  Asked that she return my call at [phone].    Lillard Anes, NP

## 2020-04-04 ENCOUNTER — Encounter: Payer: Self-pay | Admitting: Adult Health

## 2020-04-04 ENCOUNTER — Telehealth: Payer: Self-pay | Admitting: Adult Health

## 2020-04-04 ENCOUNTER — Other Ambulatory Visit: Payer: Self-pay | Admitting: Adult Health

## 2020-04-04 DIAGNOSIS — D849 Immunodeficiency, unspecified: Secondary | ICD-10-CM

## 2020-04-04 NOTE — Telephone Encounter (Signed)
Scheduled appt per 4/1 sch msg. Pt aware.  

## 2020-04-04 NOTE — Progress Notes (Signed)
I connected by phone with Beth Rivera on 04/04/2020, 8:51 AM to discuss the potential use of a new treatment, tixagevimab/cilgavimab, for pre-exposure prophylaxis for prevention of coronavirus disease 2019 (COVID-19) caused by the SARS-CoV-2 virus.  This patient is a 50 y.o. female that meets the FDA criteria for Emergency Use Authorization of tixagevimab/cilgavimab for pre-exposure prophylaxis of COVID-19 disease. Pt meets following criteria:  Age >12 yr and weight > 40kg  Not currently infected with SARS-CoV-2 and has no known recent exposure to an individual infected with SARS-CoV-2 AND o Who has moderate to severe immune compromise due to a medical condition or receipt of immunosuppressive medications or treatments and may not mount an adequate immune response to COVID-19 vaccination or  o Vaccination with any available COVID-19 vaccine, according to the approved or authorized schedule, is not recommended due to a history of severe adverse reaction (e.g., severe allergic reaction) to a COVID-19 vaccine(s) and/or COVID-19 vaccine component(s).  o Patient meets the following definition of mod-severe immune compromised status: 8. Other groups not previously mentioned: 3. CVID  I have spoken and communicated the following to the patient or parent/caregiver regarding COVID monoclonal antibody treatment:  1. FDA has authorized the emergency use of tixagevimab/cilgavimab for the pre-exposure prophylaxis of COVID-19 in patients with moderate-severe immunocompromised status, who meet above EUA criteria.  2. The significant known and potential risks and benefits of COVID monoclonal antibody, and the extent to which such potential risks and benefits are unknown.  3. Information on available alternative treatments and the risks and benefits of those alternatives, including clinical trials.  4. The patient or parent/caregiver has the option to accept or refuse COVID monoclonal antibody  treatment.  After reviewing this information with the patient, agree to receive tixagevimab/cilgavimab.  Will come in on 04/12/2020 at 9 am, high priority schedule message sent.    Noreene Filbert, NP, 04/04/2020, 8:51 AM

## 2020-04-12 ENCOUNTER — Other Ambulatory Visit: Payer: Self-pay

## 2020-04-12 ENCOUNTER — Inpatient Hospital Stay: Payer: No Typology Code available for payment source | Attending: Hematology and Oncology

## 2020-04-12 DIAGNOSIS — Z298 Encounter for other specified prophylactic measures: Secondary | ICD-10-CM | POA: Insufficient documentation

## 2020-04-12 DIAGNOSIS — D849 Immunodeficiency, unspecified: Secondary | ICD-10-CM | POA: Diagnosis present

## 2020-04-12 MED ORDER — CILGAVIMAB (PART OF EVUSHELD) INJECTION
300.0000 mg | Freq: Once | INTRAMUSCULAR | Status: AC
  Administered 2020-04-12: 300 mg via INTRAMUSCULAR

## 2020-04-12 MED ORDER — TIXAGEVIMAB (PART OF EVUSHELD) INJECTION
300.0000 mg | Freq: Once | INTRAMUSCULAR | Status: AC
  Administered 2020-04-12: 300 mg via INTRAMUSCULAR

## 2020-05-09 ENCOUNTER — Encounter: Payer: Self-pay | Admitting: Physician Assistant

## 2020-05-09 ENCOUNTER — Telehealth: Payer: No Typology Code available for payment source | Admitting: Physician Assistant

## 2020-05-09 DIAGNOSIS — N949 Unspecified condition associated with female genital organs and menstrual cycle: Secondary | ICD-10-CM

## 2020-05-09 DIAGNOSIS — R102 Pelvic and perineal pain: Secondary | ICD-10-CM

## 2020-05-09 MED ORDER — LIDOCAINE (ANORECTAL) 5 % EX CREA: TOPICAL_CREAM | CUTANEOUS | 0 refills | Status: DC

## 2020-05-09 MED ORDER — HYDROCORT-PRAMOXINE (PERIANAL) 1-1 % EX FOAM: 1.0000 | Freq: Three times a day (TID) | CUTANEOUS | 0 refills | Status: DC

## 2020-05-09 NOTE — Progress Notes (Signed)
Ms. maxine, fredman are scheduled for a virtual visit with your provider today.    Just as we do with appointments in the office, we must obtain your consent to participate.  Your consent will be active for this visit and any virtual visit you may have with one of our providers in the next 365 days.    If you have a MyChart account, I can also send a copy of this consent to you electronically.  All virtual visits are billed to your insurance company just like a traditional visit in the office.  As this is a virtual visit, video technology does not allow for your provider to perform a traditional examination.  This may limit your provider's ability to fully assess your condition.  If your provider identifies any concerns that need to be evaluated in person or the need to arrange testing such as labs, EKG, etc, we will make arrangements to do so.    Although advances in technology are sophisticated, we cannot ensure that it will always work on either your end or our end.  If the connection with a video visit is poor, we may have to switch to a telephone visit.  With either a video or telephone visit, we are not always able to ensure that we have a secure connection.   I need to obtain your verbal consent now.   Are you willing to proceed with your visit today?   Beth Rivera has provided verbal consent on 05/09/2020 for a virtual visit (video or telephone).   Beth Loveless, PA-C 05/09/2020  9:23 AM    MyChart Video Visit    Virtual Visit via Video Note   This visit type was conducted due to national recommendations for restrictions regarding the COVID-19 Pandemic (e.g. social distancing) in an effort to limit this patient's exposure and mitigate transmission in our community. This patient is at least at moderate risk for complications without adequate follow up. This format is felt to be most appropriate for this patient at this time. Physical exam was limited by quality of the video and  audio technology used for the visit.   Patient location: Home Provider location: Home office in [place]  I discussed the limitations of evaluation and management by telemedicine and the availability of in person appointments. The patient expressed understanding and agreed to proceed.  Patient: Beth Rivera   DOB: 1970/10/24   50 y.o. Female  MRN: [id number] Visit Date: 05/09/2020  Today's healthcare provider: Margaretann Loveless, PA-C   No chief complaint on file.  Subjective    HPI  [name] is a 50 yr old female that presents today via [facility] video visit for perineal pain. She reports this morning she had a completely normal BM for her. Within the hour after she noticed severe perineal/rectal pain making it difficult for her to sit. She has never had anything like this before. She does mention over a month ago she had an episode of some bloody stools x 1 day, followed by a day of mucous covered stools, followed by a few days of constipation. After this episode she called to schedule a colonoscopy, scheduled for June 2022. There is no known family history of colon cancer.   Patient Active Problem List   Diagnosis Date Noted  . Anemia 12/15/2018  . Neck pain 10/20/2017  . Pelvic swelling, RLQ 09/07/2017  . Asthmatic bronchitis , chronic (HCC) 06/28/2017  . Immunodeficiency, unspecified (HCC) 06/28/2017  . Unexplained weight loss 06/28/2017  .  Psoriasis 06/28/2017  . 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 Other allergic rhinitis 03/27/2017  . Trigeminal neuralgia 03/11/2017  . Chronic left shoulder pain 02/03/2017  . Seasonal affective disorder (HCC) 12/11/2015  . Hypersomnia 12/11/2015  . Non-intractable vomiting with nausea 12/11/2015  . Globus pharyngeus 12/11/2015  . Cholelithiasis with chronic cholecystitis 02/02/2015   Past Medical History:  Diagnosis Date  . Asthma   . Chronic cholecystitis   . Closed fracture of phalanx of left fifth toe with delayed  healing 12/14/2017  . Complication of anesthesia   . Cough LAST 3 WEEKS   CLEAR SPUTUM, NO FEVER   . Depression   . Eczema   . Family history of adverse reaction to anesthesia    SON HAS PONV  . Guttate psoriasis 1993  . IgA deficiency (HCC) 2019  . Neck pain   . PMS (premenstrual syndrome)   . PONV (postoperative nausea and vomiting)   . Poor sleep pattern   . Psoriasis    GUTATE, MOSTLY ON LIMBS  . Recurrent upper respiratory infection (URI)   . Shoulder pain   . Sinus mucosal thickening   . Tinnitus    BOTH EARS  . Trigeminal neuralgia 03/11/2017      Medications: Outpatient Medications Prior to Visit  Medication Sig  . albuterol (PROAIR HFA) 108 (90 Base) MCG/ACT inhaler Inhale 2 puffs every 6 hours IF NEEDED for wheeze/ shortness of breath  . amphetamine-dextroamphetamine (ADDERALL) 10 MG tablet 1 or 2 daily as needed for sleepiness  . buPROPion (WELLBUTRIN XL) 150 MG 24 hr tablet TAKE 1 TABLET BY MOUTH EVERY DAY  . clonazePAM (KLONOPIN) 0.5 MG disintegrating tablet TAKE 1-3 TABLETS BY MOUTH FOR SLEEP AS NEEDED  . Cyanocobalamin (VITAMIN B12 PO) Take by mouth. 3 times a week  . cyclobenzaprine (FLEXERIL) 10 MG tablet TAKE 1 TABLET BY MOUTH AT BEDTIME AS NEEDED FOR MUSCLE SPASMS  . DULoxetine (CYMBALTA) 30 MG capsule TAKE 1 CAPSULE BY MOUTH EVERY DAY  . Ferrous Sulfate (IRON) 325 (65 Fe) MG TABS Take 1 tablet (325 mg total) by mouth 2 (two) times daily with a meal.  . fluticasone (FLONASE) 50 MCG/ACT nasal spray Place 2 sprays into both nostrils daily.  . fluticasone (FLOVENT HFA) 110 MCG/ACT inhaler Inhale 2 puffs, then rinse moth, twice daily- maintenance  . gabapentin (NEURONTIN) 300 MG capsule Take 300 mg by mouth. Taking 6- 7 tabs per day  . Magnesium Gluconate 550 MG TABS Take by mouth.  . Vitamin D, Ergocalciferol, (DRISDOL) 1.25 MG (50000 UNIT) CAPS capsule TAKE 1 CAPSULE (50,000 UNITS TOTAL) BY MOUTH EVERY 7 (SEVEN) DAYS.   Facility-Administered Medications  Prior to Visit  Medication Dose Route Frequency Provider  . 0.9 %  sodium chloride infusion  500 mL Intravenous Once Meryl Dare, MD    Review of Systems  Constitutional: Negative.   Respiratory: Negative.   Cardiovascular: Negative.   Gastrointestinal: Positive for rectal pain. Negative for anal bleeding, blood in stool, constipation and diarrhea.    Last CBC Lab Results  Component Value Date   WBC 5.8 04/20/2019   HGB 13.1 04/20/2019   HCT 40.1 04/20/2019   MCV 91.3 04/20/2019   MCH 29.8 04/20/2019   RDW 13.6 04/20/2019   PLT 318 04/20/2019   Last metabolic panel Lab Results  Component Value Date   GLUCOSE 114 (H) 04/20/2019   NA 140 04/20/2019   K 4.1 04/20/2019   CL 105 04/20/2019   CO2 26 04/20/2019   BUN  8 04/20/2019   CREATININE 0.83 04/20/2019   GFRNONAA >60 04/20/2019   GFRAA >60 04/20/2019   CALCIUM 8.6 (L) 04/20/2019   PROT 7.3 04/20/2019   ALBUMIN 3.9 04/20/2019   BILITOT 0.2 (L) 04/20/2019   ALKPHOS 54 04/20/2019   AST 14 (L) 04/20/2019   ALT 9 04/20/2019   ANIONGAP 9 04/20/2019      Objective    There were no vitals taken for this visit. BP Readings from Last 3 Encounters:  04/12/20 (!) 140/97  08/03/19 108/76  05/04/19 124/64   Wt Readings from Last 3 Encounters:  08/03/19 127 lb 6 oz (57.8 kg)  05/04/19 134 lb 6.4 oz (61 kg)  04/20/19 136 lb 1.6 oz (61.7 kg)      Physical Exam Vitals reviewed.  Constitutional:      General: She is not in acute distress.    Appearance: Normal appearance. She is well-developed. She is not ill-appearing.  HENT:     Head: Normocephalic and atraumatic.  Pulmonary:     Effort: Pulmonary effort is normal. No respiratory distress.  Genitourinary:    Comments: Patient reports she did a self exam and saw no abnormalities Musculoskeletal:     Cervical back: Normal range of motion and neck supple.  Neurological:     Mental Status: She is alert.  Psychiatric:        Mood and Affect: Mood normal.         Behavior: Behavior normal.        Thought Content: Thought content normal.        Judgment: Judgment normal.        Assessment & Plan     1. Perineal pain in female - New without bleeding - Suspect anal fissure vs internal hemorrhoid (patient did self exam and reports she could not see anything) - Will give proctofoam-hc as below for symptomatic relief - Discussed Epsom salt soaks (2 cups of epsom salt in warm bath water) - May use tylenol for pain - Discussed if significant bleeding begins to seek emergent care at ER, patient voiced understanding - If not worsening, but not improving, patient needs in-person evaluation to visualize what may be going on - hydrocortisone-pramoxine (PROCTOFOAM-HC) rectal foam; Place 1 applicator rectally 3 (three) times daily.  Dispense: 10 g; Refill: 0 - Lidocaine, Anorectal, 5 % CREA; Apply topically to affected area every 6 hours PRN for pain  Dispense: 28 g; Refill: 0   No follow-ups on file.     I discussed the assessment and treatment plan with the patient. The patient was provided an opportunity to ask questions and all were answered. The patient agreed with the plan and demonstrated an understanding of the instructions.   The patient was advised to call back or seek an in-person evaluation if the symptoms worsen or if the condition fails to improve as anticipated.  I provided 12 minutes of face-to-face time during this encounter via MyChart Video enabled encounter.   Reine Just [facility] [phone] (phone) [phone] (fax)  [facility]

## 2020-05-09 NOTE — Patient Instructions (Signed)
Anal Fissure, Adult  An anal fissure is a small tear or crack in the tissue of the anus. Bleeding from a fissure usually stops on its own within a few minutes. However, bleeding will often occur again with each bowel movement until the fissure heals. What are the causes? This condition is usually caused by passing a large or hard stool (feces). Other causes include:  Constipation.  Frequent diarrhea.  Inflammatory bowel disease (Crohn's disease or ulcerative colitis).  Childbirth.  Infections.  Anal sex. What are the signs or symptoms? Symptoms of this condition include:  Bleeding from the rectum.  Small amounts of blood seen on your stool, on the toilet paper, or in the toilet after a bowel movement. The blood coats the outside of the stool and is not mixed with the stool.  Painful bowel movements.  Itching or irritation around the anus. How is this diagnosed? A health care provider may diagnose this condition by closely examining the anal area. An anal fissure can usually be seen with careful inspection. In some cases, a rectal exam may be performed, or a short tube (anoscope) may be used to examine the anal canal. How is this treated? Initial treatment for this condition may include:  Taking steps to avoid constipation. This may include making changes to your diet, such as increasing your intake of fiber or fluid.  Taking fiber supplements. These supplements can soften your stool to help make bowel movements easier. Your health care provider may also prescribe a stool softener if your stool is hard.  Taking sitz baths. This may help to heal the tear.  Using medicated creams or ointments. These may be prescribed to lessen discomfort. Treatments that are sometimes used if initial treatments do not work well or if the condition is more severe may include:  Botulinum injection.  Surgery to repair the fissure. Follow these instructions at home: Eating and drinking  Avoid  foods that may cause constipation, such as bananas, milk, and other dairy products.  Eat all fruits, except bananas.  Drink enough fluid to keep your urine pale yellow.  Eat foods that are high in fiber, such as beans, whole grains, and fresh fruits and vegetables.   General instructions  Take over-the-counter and prescription medicines only as told by your health care provider.  Use creams or ointments only as told by your health care provider.  Keep the anal area clean and dry.  Take sitz baths as told by your health care provider. Do not use soap in the sitz baths.  Keep all follow-up visits as told by your health care provider. This is important.   Contact a health care provider if you have:  More bleeding.  A fever.  Diarrhea that is mixed with blood.  Pain that continues.  Ongoing problems that are getting worse rather than better. Summary  An anal fissure is a small tear or crack in the tissue of the anus. This condition is usually caused by passing a large or hard stool (feces). Other causes include constipation and frequent diarrhea.  Initial treatment for this condition may include taking steps to avoid constipation, such as increasing your intake of fiber or fluid.  Follow instructions for care as told by your health care provider.  Contact your health care provider if you have more bleeding or your problem is getting worse rather than better.  Keep all follow-up visits as told by your health care provider. This is important. This information is not intended to replace   advice given to you by your health care provider. Make sure you discuss any questions you have with your health care provider. Document Revised: 06/02/2017 Document Reviewed: 06/02/2017 Elsevier Patient Education  2021 [employer]   Hemorrhoids Hemorrhoids are swollen veins in and around the rectum or anus. There are two types of hemorrhoids:  Internal hemorrhoids. These occur in the veins  that are just inside the rectum. They may poke through to the outside and become irritated and painful.  External hemorrhoids. These occur in the veins that are outside the anus and can be felt as a painful swelling or hard lump near the anus. Most hemorrhoids do not cause serious problems, and they can be managed with home treatments such as diet and lifestyle changes. If home treatments do not help the symptoms, procedures can be done to shrink or remove the hemorrhoids. What are the causes? This condition is caused by increased pressure in the anal area. This pressure may result from various things, including:  Constipation.  Straining to have a bowel movement.  Diarrhea.  Pregnancy.  Obesity.  Sitting for long periods of time.  Heavy lifting or other activity that causes you to strain.  Anal sex.  Riding a bike for a long period of time. What are the signs or symptoms? Symptoms of this condition include:  Pain.  Anal itching or irritation.  Rectal bleeding.  Leakage of stool (feces).  Anal swelling.  One or more lumps around the anus. How is this diagnosed? This condition can often be diagnosed through a visual exam. Other exams or tests may also be done, such as:  An exam that involves feeling the rectal area with a gloved hand (digital rectal exam).  An exam of the anal canal that is done using a small tube (anoscope).  A blood test, if you have lost a significant amount of blood.  A test to look inside the colon using a flexible tube with a camera on the end (sigmoidoscopy or colonoscopy). How is this treated? This condition can usually be treated at home. However, various procedures may be done if dietary changes, lifestyle changes, and other home treatments do not help your symptoms. These procedures can help make the hemorrhoids smaller or remove them completely. Some of these procedures involve surgery, and others do not. Common procedures  include:  Rubber band ligation. Rubber bands are placed at the base of the hemorrhoids to cut off their blood supply.  Sclerotherapy. Medicine is injected into the hemorrhoids to shrink them.  Infrared coagulation. A type of light energy is used to get rid of the hemorrhoids.  Hemorrhoidectomy surgery. The hemorrhoids are surgically removed, and the veins that supply them are tied off.  Stapled hemorrhoidopexy surgery. The surgeon staples the base of the hemorrhoid to the rectal wall. Follow these instructions at home: Eating and drinking  Eat foods that have a lot of fiber in them, such as whole grains, beans, nuts, fruits, and vegetables.  Ask your health care provider about taking products that have added fiber (fiber supplements).  Reduce the amount of fat in your diet. You can do this by eating low-fat dairy products, eating less red meat, and avoiding processed foods.  Drink enough fluid to keep your urine pale yellow.   Managing pain and swelling  Take warm sitz baths for 20 minutes, 3-4 times a day to ease pain and discomfort. You may do this in a bathtub or using a portable sitz bath that fits over the  toilet.  If directed, apply ice to the affected area. Using ice packs between sitz baths may be helpful. ? Put ice in a plastic bag. ? Place a towel between your skin and the bag. ? Leave the ice on for 20 minutes, 2-3 times a day.   General instructions  Take over-the-counter and prescription medicines only as told by your health care provider.  Use medicated creams or suppositories as told.  Get regular exercise. Ask your health care provider how much and what kind of exercise is best for you. In general, you should do moderate exercise for at least 30 minutes on most days of the week (150 minutes each week). This can include activities such as walking, biking, or yoga.  Go to the bathroom when you have the urge to have a bowel movement. Do not wait.  Avoid straining  to have bowel movements.  Keep the anal area dry and clean. Use wet toilet paper or moist towelettes after a bowel movement.  Do not sit on the toilet for long periods of time. This increases blood pooling and pain.  Keep all follow-up visits as told by your health care provider. This is important. Contact a health care provider if you have:  Increasing pain and swelling that are not controlled by treatment or medicine.  Difficulty having a bowel movement, or you are unable to have a bowel movement.  Pain or inflammation outside the area of the hemorrhoids. Get help right away if you have:  Uncontrolled bleeding from your rectum. Summary  Hemorrhoids are swollen veins in and around the rectum or anus.  Most hemorrhoids can be managed with home treatments such as diet and lifestyle changes.  Taking warm sitz baths can help ease pain and discomfort.  In severe cases, procedures or surgery can be done to shrink or remove the hemorrhoids. This information is not intended to replace advice given to you by your health care provider. Make sure you discuss any questions you have with your health care provider. Document Revised: 05/19/2018 Document Reviewed: 05/12/2017 Elsevier Patient Education  2021 [employer].

## 2020-06-11 ENCOUNTER — Other Ambulatory Visit: Payer: Self-pay

## 2020-06-11 ENCOUNTER — Ambulatory Visit: Payer: No Typology Code available for payment source | Admitting: Family Medicine

## 2020-06-11 ENCOUNTER — Encounter: Payer: Self-pay | Admitting: Family Medicine

## 2020-06-11 VITALS — BP 112/70 | HR 92 | Temp 99.2°F | Ht 66.0 in | Wt 145.1 lb

## 2020-06-11 DIAGNOSIS — Z13220 Encounter for screening for lipoid disorders: Secondary | ICD-10-CM | POA: Diagnosis not present

## 2020-06-11 DIAGNOSIS — M792 Neuralgia and neuritis, unspecified: Secondary | ICD-10-CM | POA: Diagnosis not present

## 2020-06-11 DIAGNOSIS — G500 Trigeminal neuralgia: Secondary | ICD-10-CM | POA: Diagnosis not present

## 2020-06-11 DIAGNOSIS — E559 Vitamin D deficiency, unspecified: Secondary | ICD-10-CM

## 2020-06-11 DIAGNOSIS — M542 Cervicalgia: Secondary | ICD-10-CM

## 2020-06-11 DIAGNOSIS — D649 Anemia, unspecified: Secondary | ICD-10-CM | POA: Diagnosis not present

## 2020-06-11 DIAGNOSIS — D802 Selective deficiency of immunoglobulin A [IgA]: Secondary | ICD-10-CM

## 2020-06-11 DIAGNOSIS — G8929 Other chronic pain: Secondary | ICD-10-CM

## 2020-06-11 DIAGNOSIS — K921 Melena: Secondary | ICD-10-CM

## 2020-06-11 DIAGNOSIS — Z131 Encounter for screening for diabetes mellitus: Secondary | ICD-10-CM | POA: Diagnosis not present

## 2020-06-11 LAB — CBC WITH DIFFERENTIAL/PLATELET
Basophils Absolute: 0 10*3/uL (ref 0.0–0.1)
Basophils Relative: 0.6 % (ref 0.0–3.0)
Eosinophils Absolute: 0.2 10*3/uL (ref 0.0–0.7)
Eosinophils Relative: 2.9 % (ref 0.0–5.0)
HCT: 37.4 % (ref 36.0–46.0)
Hemoglobin: 12 g/dL (ref 12.0–15.0)
Lymphocytes Relative: 38.2 % (ref 12.0–46.0)
Lymphs Abs: 2 10*3/uL (ref 0.7–4.0)
MCHC: 32.1 g/dL (ref 30.0–36.0)
MCV: 84.7 fl (ref 78.0–100.0)
Monocytes Absolute: 0.4 10*3/uL (ref 0.1–1.0)
Monocytes Relative: 6.8 % (ref 3.0–12.0)
Neutro Abs: 2.7 10*3/uL (ref 1.4–7.7)
Neutrophils Relative %: 51.5 % (ref 43.0–77.0)
Platelets: 349 10*3/uL (ref 150.0–400.0)
RBC: 4.42 Mil/uL (ref 3.87–5.11)
RDW: 15.6 % — ABNORMAL HIGH (ref 11.5–15.5)
WBC: 5.3 10*3/uL (ref 4.0–10.5)

## 2020-06-11 LAB — VITAMIN D 25 HYDROXY (VIT D DEFICIENCY, FRACTURES): VITD: 26.1 ng/mL — ABNORMAL LOW (ref 30.00–100.00)

## 2020-06-11 LAB — COMPREHENSIVE METABOLIC PANEL
ALT: 10 U/L (ref 0–35)
AST: 15 U/L (ref 0–37)
Albumin: 4.3 g/dL (ref 3.5–5.2)
Alkaline Phosphatase: 52 U/L (ref 39–117)
BUN: 10 mg/dL (ref 6–23)
CO2: 30 mEq/L (ref 19–32)
Calcium: 9.1 mg/dL (ref 8.4–10.5)
Chloride: 101 mEq/L (ref 96–112)
Creatinine, Ser: 0.77 mg/dL (ref 0.40–1.20)
GFR: 91 mL/min (ref >60.00)
Glucose, Bld: 65 mg/dL — ABNORMAL LOW (ref 70–99)
Potassium: 4.4 mEq/L (ref 3.5–5.1)
Sodium: 136 mEq/L (ref 135–145)
Total Bilirubin: 0.4 mg/dL (ref 0.2–1.2)
Total Protein: 7.2 g/dL (ref 6.0–8.3)

## 2020-06-11 LAB — LIPID PANEL
Cholesterol: 211 mg/dL — ABNORMAL HIGH (ref 0–200)
HDL: 71.5 mg/dL (ref >39.00)
LDL Cholesterol: 113 mg/dL — ABNORMAL HIGH (ref 0–99)
NonHDL: 139.73
Total CHOL/HDL Ratio: 3
Triglycerides: 132 mg/dL (ref 0.0–149.0)
VLDL: 26.4 mg/dL (ref 0.0–40.0)

## 2020-06-11 LAB — FERRITIN: Ferritin: 3.8 ng/mL — ABNORMAL LOW (ref 10.0–291.0)

## 2020-06-11 NOTE — Progress Notes (Signed)
Beth Rivera DOB: 04/24/1970 Encounter date: 06/11/2020  This is a 50 y.o. female who presents with Chief Complaint  Patient presents with   Stool Color Change    Patient states she noticed material of "blood-grit colored" in the stools in April, did not seen treatment at that time, requests colonoscopy    History of present illness: States that when above occurred, she had not gone to the bathroom for 4 days. Then after above happened, things were weird for a couple of weeks - urges without going, mucous, gas. Only pain was feeling of needing to go. 3-4 weeks ago woke up and when she got up couldn't sit down - perineum hurt to point of not being able to sit down - donut didn't help. Felt like having baby stuck in wrong position. Next day she could sit up a little; by following day was fine. No blood in stool with that episode. No nausea (outside what she states is normal for IgA def)  She has put on weight: thought it was related to medication she was taking in jan, feb, march. Was taking mirtazepine.   No urinary issues. Sometimes up more at night in recent years (in last decade).   Breathing has been ok. Gardening flares up a little. Playing tennis ok.   Trigeminal neuralgia is "same as rest of nerve pain". Has been in limbs in last year, now in breast, back intermittently. She is seeing neurology at [facility], Dr. Pearlie Oyster.   Vision is terrible; has seen specialists, but hasn't gotten answers.   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 Gets pain in occiput and then will flares - sometimes gets pinched in upper right shoulder and then arm will go numb - sometimes just upper arm; sometimes all the way down arm and into fingers - more into middle and 4th fingers. She does also have hx of scoliosis.   Has hit head on more things lately - not sure if vision related. [place] Not losing visual field, but having double to triple vision that makes focusing more difficult. Has seen retinal specialist as well.   Allergies  Allergen Reactions   Other     Seasonal, dogs   Current Meds  Medication Sig   albuterol (PROAIR HFA) 108 (90 Base) MCG/ACT inhaler Inhale 2 puffs every 6 hours IF NEEDED for wheeze/ shortness of breath   amphetamine-dextroamphetamine (ADDERALL) 10 MG tablet 1 or 2 daily as needed for sleepiness   Cyanocobalamin (VITAMIN B12 PO) Take by mouth. 3 times a week   cyclobenzaprine (FLEXERIL) 10 MG tablet TAKE 1 TABLET BY MOUTH AT BEDTIME AS NEEDED FOR MUSCLE SPASMS   DULoxetine (CYMBALTA) 60 MG capsule duloxetine 60 mg capsule,delayed release  TAKE 1 CAPSULE BY MOUTH TWICE A DAY   Ferrous Sulfate (IRON) 325 (65 Fe) MG TABS Take 1 tablet (325 mg total) by mouth 2 (two) times daily with a meal.   fluticasone (FLOVENT HFA) 110 MCG/ACT inhaler Inhale 2 puffs, then rinse moth, twice daily- maintenance   traZODone (DESYREL) 50 MG tablet trazodone 50 mg tablet  TAKE 1 TO 2 TABLETS BY MOUTH NIGHTLY AS NEEDED FOR SLEEP   Current Facility-Administered Medications for the 06/11/20 encounter (Office Visit) with Wynn Banker, MD  Medication   0.9 %  sodium chloride infusion    Review of Systems  Constitutional:  Positive for fatigue.  Negative for chills and fever.  Eyes:  Positive for visual disturbance.  Respiratory:  Negative for cough, chest tightness, shortness of breath and wheezing.   Cardiovascular:  Negative for chest pain, palpitations and leg swelling.  Musculoskeletal:  Positive for neck pain and neck stiffness.   Objective:  BP 112/70 (BP Location: Left Arm, Patient Position: Sitting, Cuff Size: Normal)   Pulse 92   Temp 99.2 F (37.3 C) (Oral)   Ht 5\' 6"  (1.676 m)   Wt 145 lb 1.6 oz (65.8 kg)   BMI 23.42 kg/m   Weight: 145 lb 1.6 oz (65.8 kg)   BP Readings from Last 3 Encounters:  06/11/20 112/70   04/12/20 (!) 140/97  08/03/19 108/76   Wt Readings from Last 3 Encounters:  06/11/20 145 lb 1.6 oz (65.8 kg)  08/03/19 127 lb 6 oz (57.8 kg)  05/04/19 134 lb 6.4 oz (61 kg)    Physical Exam Constitutional:      General: She is not in acute distress.    Appearance: She is well-developed.  Cardiovascular:     Rate and Rhythm: Normal rate and regular rhythm.     Heart sounds: Normal heart sounds. No murmur heard.   No friction rub.  Pulmonary:     Effort: Pulmonary effort is normal. No respiratory distress.     Breath sounds: Normal breath sounds. No wheezing or rales.  Abdominal:     General: Abdomen is flat. Bowel sounds are normal.     Palpations: Abdomen is soft.     Tenderness: There is no abdominal tenderness.  Genitourinary:    Rectum: Guaiac result negative. External hemorrhoid present. No mass, tenderness or internal hemorrhoid. Normal anal tone.  Musculoskeletal:     Right lower leg: No edema.     Left lower leg: No edema.  Neurological:     Mental Status: She is alert and oriented to person, place, and time.  Psychiatric:        Behavior: Behavior normal.    Assessment/Plan  1. Blood in stool Single episode, but she is due for colonoscopy due to age anyway; referral placed. - Ambulatory referral to Gastroenterology  2. Chronic neck pain I do think that seeing ortho is next best step. Could consider spots med/osteopathic treatment as well. - Ambulatory referral to Orthopedics  3. Trigeminal neuralgia Multiple neurological complaints/nerve complaints. Feels there is connection that has not yet been made.  She is currently on Cymbalta. - Ambulatory referral to Neurology  4. Nerve pain See above. - Ambulatory referral to Neurology - Comprehensive metabolic panel; Future  5. Anemia, unspecified type Recheck blood work.  She has seen hematology in the past.  I will forward results to them. - CBC with Differential/Platelet; Future - Ferritin; Future -  Retic Panel; Future - Iron and TIBC; Future  6. IgA deficiency (HCC) Chronic condition.  7. Lipid screening - Lipid panel; Future  8. Screening for diabetes mellitus - Comprehensive metabolic panel; Future  9. Vitamin D deficiency - VITAMIN D 25 Hydroxy (Vit-D Deficiency, Fractures); Future    Return in about 6 months (around 12/11/2020) for physical exam.     14/08/2020, MD

## 2020-06-12 LAB — IRON AND TIBC
Iron Saturation: 8 % — CL (ref 15–55)
Iron: 29 ug/dL (ref 27–159)
Total Iron Binding Capacity: 350 ug/dL (ref 250–450)
UIBC: 321 ug/dL (ref 131–425)

## 2020-09-17 ENCOUNTER — Encounter: Payer: Self-pay | Admitting: Family Medicine

## 2020-09-18 ENCOUNTER — Encounter: Payer: No Typology Code available for payment source | Admitting: Family Medicine

## 2020-09-18 ENCOUNTER — Encounter: Payer: Self-pay | Admitting: Family Medicine

## 2020-09-18 NOTE — Progress Notes (Signed)
[place]. Did advise her of other options for care. She was grateful.

## 2020-09-19 ENCOUNTER — Telehealth: Payer: No Typology Code available for payment source | Admitting: Family Medicine

## 2020-09-19 ENCOUNTER — Encounter: Payer: Self-pay | Admitting: Family Medicine

## 2020-09-19 DIAGNOSIS — J4540 Moderate persistent asthma, uncomplicated: Secondary | ICD-10-CM

## 2020-09-19 DIAGNOSIS — U071 COVID-19: Secondary | ICD-10-CM | POA: Diagnosis not present

## 2020-09-19 MED ORDER — MOLNUPIRAVIR EUA 200MG CAPSULE: 4.0000 | ORAL_CAPSULE | Freq: Two times a day (BID) | ORAL | 0 refills | Status: AC

## 2020-09-19 NOTE — Progress Notes (Signed)
Ms. Beth, Rivera are scheduled for a virtual visit with your provider today.    Just as we do with appointments in the office, we must obtain your consent to participate.  Your consent will be active for this visit and any virtual visit you may have with one of our providers in the next 365 days.    If you have a MyChart account, I can also send a copy of this consent to you electronically.  All virtual visits are billed to your insurance company just like a traditional visit in the office.  As this is a virtual visit, video technology does not allow for your provider to perform a traditional examination.  This may limit your provider's ability to fully assess your condition.  If your provider identifies any concerns that need to be evaluated in person or the need to arrange testing such as labs, EKG, etc, we will make arrangements to do so.    Although advances in technology are sophisticated, we cannot ensure that it will always work on either your end or our end.  If the connection with a video visit is poor, we may have to switch to a telephone visit.  With either a video or telephone visit, we are not always able to ensure that we have a secure connection.   I need to obtain your verbal consent now.   Are you willing to proceed with your visit today?   Beth Rivera has provided verbal consent on 09/19/2020 for a virtual visit (video or telephone).   Freddy Finner, NP 09/19/2020  10:30 AM   Date:  09/19/2020   ID:  Beth Rivera, DOB [birth year], MRN [id number]  Patient Location: Home Provider Location: Home Office   Participants: Patient and Provider for Visit and Wrap up  Method of visit: Video  Location of Patient: Home Location of Provider: Home Office Consent was obtain for visit over the video. Services rendered by provider: Visit was performed via video  A video enabled telemedicine application was used and I verified that I am speaking with the correct person using two  identifiers.  PCP:  Wynn Banker, MD   Chief Complaint:  covid   History of Present Illness:    Beth Rivera is a 50 y.o. female with history as stated below. Presents video telehealth for an acute care visit due to covid + test   Onset of symptoms Sunday/Monday, husband was + on Sunday. Current symptoms have been persistent and include: wheezing sounds with breathing, congestion in ears and nasal, diarrhea, nausea, fever t max 100. Cough mildly productive- shortness of breath at times- hx of asthma.  Concerned about traveling to see family.   Denies having chest pain, ear pain.  Modifying factors include: ibuprofen No other aggravating or relieving factors.  No other c/o.  Past Medical, Surgical, Social History, Allergies, and Medications have been Reviewed.  Past Medical History:  Diagnosis Date   Asthma    Chronic cholecystitis    Closed fracture of phalanx of left fifth toe with delayed healing 12/14/2017   Complication of anesthesia    Cough LAST 3 WEEKS   CLEAR SPUTUM, NO FEVER    Depression    Eczema    Family history of adverse reaction to anesthesia    SON HAS PONV   Guttate psoriasis 1993   IgA deficiency (HCC) 2019   Neck pain    PMS (premenstrual syndrome)    PONV (postoperative nausea and vomiting)    Poor sleep  pattern    Psoriasis    GUTATE, MOSTLY ON LIMBS   Recurrent upper respiratory infection (URI)    Shoulder pain    Sinus mucosal thickening    Tinnitus    BOTH EARS   Trigeminal neuralgia 03/11/2017    No outpatient medications have been marked as taking for the 09/19/20 encounter (Appointment) with Freddy Finner, NP.   Current Facility-Administered Medications for the 09/19/20 encounter (Appointment) with Freddy Finner, NP  Medication   0.9 %  sodium chloride infusion     Allergies:   Other   ROS See HPI for history of present illness.  Physical Exam Constitutional:      Appearance: Normal appearance.  HENT:     Head:  Normocephalic and atraumatic.     Right Ear: External ear normal.     Left Ear: External ear normal.  Eyes:     Conjunctiva/sclera: Conjunctivae normal.  Pulmonary:     Effort: Pulmonary effort is normal.     Comments: No shortness of breath noted on visit Cough appreciated  Musculoskeletal:        General: Normal range of motion.     Cervical back: Normal range of motion.  Neurological:     Mental Status: She is alert and oriented to person, place, and time.  Psychiatric:        Mood and Affect: Mood normal.        Behavior: Behavior normal.        Thought Content: Thought content normal.        Judgment: Judgment normal.              A&P  1. COVID-19 S&S + HT for COVID Will be travelling soon to visit family- would like antiviral option. Given hx of asthma will provide this. Is using inhalers and overall asthma is currently controlled no need for pred pack   Reviewed side effects, risks and benefits of medication.   Patient acknowledged agreement and understanding of the plan.     - molnupiravir EUA (LAGEVRIO) 200 mg CAPS capsule; Take 4 capsules (800 mg total) by mouth 2 (two) times daily for 5 days.  Dispense: 40 capsule; Refill: 0     2. Moderate persistent asthma without complication Will be travelling soon to visit family- would like antiviral option-above. Given hx of asthma will provide this. Is using inhalers and overall asthma is currently controlled no need for pred pack    I discussed the assessment and treatment plan with the patient. The patient was provided an opportunity to ask questions and all were answered. The patient agreed with the plan and demonstrated an understanding of the instructions.   The patient was advised to call back or seek an in-person evaluation if the symptoms worsen or if the condition fails to improve as anticipated.   The above assessment and management plan was discussed with the patient. The patient verbalized  understanding of and has agreed to the management plan. Patient is aware to call the clinic if symptoms persist or worsen. Patient is aware when to return to the clinic for a follow-up visit. Patient educated on when it is appropriate to go to the emergency department.    Time:   Today, I have spent 10 minutes with the patient with telehealth technology discussing the above problems, reviewing the chart, previous notes, medications and orders.   Medication Changes: No orders of the defined types were placed in this encounter.    Disposition:  Follow up pcp Signed, Freddy Finner, NP  09/19/2020 10:30 AM

## 2020-09-19 NOTE — Patient Instructions (Signed)
I appreciate the opportunity to provide you with care for your health and wellness.  Please take medication in full and as directed  I hope you feel better soon and enjoy your trip to visit your family in a few weeks  Please continue to practice social distancing to keep you, your family, and our community safe.  If you must go out, please wear a mask and practice good handwashing.  Have a wonderful day. With [employer], Tereasa Coop, DNP, AGNP-BC  Please keep well-hydrated and get plenty of rest. Start a saline nasal rinse to flush out your nasal passages. You can use plain Mucinex to help thin congestion. If you have a humidifier, running in the bedroom at night. I want you to start OTC vitamin D3 1000 units daily, vitamin C 1000 mg daily, and a zinc supplement. Please take prescribed medications as directed.  You have been enrolled in a MyChart symptom monitoring program. Please answer these questions daily so we can keep track of how you are doing.  You were to quarantine for 5 days from onset of your symptoms.  After day 5, if you have had no fever and you are feeling better, you can end quarantine but need to mask for an additional 5 days. After day 5 if you have a fever or are having significant symptoms, please quarantine for full 10 days.  If you note any worsening of symptoms, any significant shortness of breath or any chest pain, please seek ER evaluation ASAP.  Please do not delay care!  COVID-19: What to Do if You Are Sick If you test positive and are an older adult or someone who is at high risk of getting very sick from COVID-19, treatment may be available. Contact a healthcare provider right away after a positive test to determine if you are eligible, even if your symptoms are mild right now. You can also visit a Test to Treat location and, if eligible, receive a prescription from a provider. Don't delay: Treatment must be started within the first few days to be  effective. If you have a fever, cough, or other symptoms, you might have COVID-19. Most people have mild illness and are able to recover at home. If you are sick: Keep track of your symptoms. If you have an emergency warning sign (including trouble breathing), call 911. Steps to help prevent the spread of COVID-19 if you are sick If you are sick with COVID-19 or think you might have COVID-19, follow the steps below to care for yourself and to help protect other people in your home and community. Stay home except to get medical care Stay home. Most people with COVID-19 have mild illness and can recover at home without medical care. Do not leave your home, except to get medical care. Do not visit public areas and do not go to places where you are unable to wear a mask. Take care of yourself. Get rest and stay hydrated. Take over-the-counter medicines, such as acetaminophen, to help you feel better. Stay in touch with your doctor. Call before you get medical care. Be sure to get care if you have trouble breathing, or have any other emergency warning signs, or if you think it is an emergency. Avoid public transportation, ride-sharing, or taxis if possible. Get tested If you have symptoms of COVID-19, get tested. While waiting for test results, stay away from others, including staying apart from those living in your household. Get tested as soon as possible after your symptoms start.  Treatments may be available for people with COVID-19 who are at risk for becoming very sick. Don't delay: Treatment must be started early to be effective--some treatments must begin within 5 days of your first symptoms. Contact your healthcare provider right away if your test result is positive to determine if you are eligible. Self-tests are one of several options for testing for the virus that causes COVID-19 and may be more convenient than laboratory-based tests and point-of-care tests. Ask your healthcare provider or your  local health department if you need help interpreting your test results. You can visit your state, tribal, local, and territorial health department's website to look for the latest local information on testing sites. Separate yourself from other people As much as possible, stay in a specific room and away from other people and pets in your home. If possible, you should use a separate bathroom. If you need to be around other people or animals in or outside of the home, wear a well-fitting mask. Tell your close contacts that they may have been exposed to COVID-19. An infected person can spread COVID-19 starting 48 hours (or 2 days) before the person has any symptoms or tests positive. By letting your close contacts know they may have been exposed to COVID-19, you are helping to protect everyone. See COVID-19 and Animals if you have questions about pets. If you are diagnosed with COVID-19, someone from the health department may call you. Answer the call to slow the spread. Monitor your symptoms Symptoms of COVID-19 include fever, cough, or other symptoms. Follow care instructions from your healthcare provider and local health department. Your local health authorities may give instructions on checking your symptoms and reporting information. When to seek emergency medical attention Look for emergency warning signs* for COVID-19. If someone is showing any of these signs, seek emergency medical care immediately: Trouble breathing Persistent pain or pressure in the chest New confusion Inability to wake or stay awake Pale, gray, or blue-colored skin, lips, or nail beds, depending on skin tone *This list is not all possible symptoms. Please call your medical provider for any other symptoms that are severe or concerning to you. Call 911 or call ahead to your local emergency facility: Notify the operator that you are seeking care for someone who has or may have COVID-19. Call ahead before visiting your  doctor Call ahead. Many medical visits for routine care are being postponed or done by phone or telemedicine. If you have a medical appointment that cannot be postponed, call your doctor's office, and tell them you have or may have COVID-19. This will help the office protect themselves and other patients. If you are sick, wear a well-fitting mask You should wear a mask if you must be around other people or animals, including pets (even at home). Wear a mask with the best fit, protection, and comfort for you. You don't need to wear the mask if you are alone. If you can't put on a mask (because of trouble breathing, for example), cover your coughs and sneezes in some other way. Try to stay at least 6 feet away from other people. This will help protect the people around you. Masks should not be placed on young children under age 18 years, anyone who has trouble breathing, or anyone who is not able to remove the mask without help. Cover your coughs and sneezes Cover your mouth and nose with a tissue when you cough or sneeze. Throw away used tissues in a lined trash can.  Immediately wash your hands with soap and water for at least 20 seconds. If soap and water are not available, clean your hands with an alcohol-based hand sanitizer that contains at least 60% alcohol. Clean your hands often Wash your hands often with soap and water for at least 20 seconds. This is especially important after blowing your nose, coughing, or sneezing; going to the bathroom; and before eating or preparing food. Use hand sanitizer if soap and water are not available. Use an alcohol-based hand sanitizer with at least 60% alcohol, covering all surfaces of your hands and rubbing them together until they feel dry. Soap and water are the best option, especially if hands are visibly dirty. Avoid touching your eyes, nose, and mouth with unwashed hands. Handwashing Tips Avoid sharing personal household items Do not share dishes,  drinking glasses, cups, eating utensils, towels, or bedding with other people in your home. Wash these items thoroughly after using them with soap and water or put in the dishwasher. Clean surfaces in your home regularly Clean and disinfect high-touch surfaces (for example, doorknobs, tables, handles, light switches, and countertops) in your "sick room" and bathroom. In shared spaces, you should clean and disinfect surfaces and items after each use by the person who is ill. If you are sick and cannot clean, a caregiver or other person should only clean and disinfect the area around you (such as your bedroom and bathroom) on an as needed basis. Your caregiver/other person should wait as long as possible (at least several hours) and wear a mask before entering, cleaning, and disinfecting shared spaces that you use. Clean and disinfect areas that may have blood, stool, or body fluids on them. Use household cleaners and disinfectants. Clean visible dirty surfaces with household cleaners containing soap or detergent. Then, use a household disinfectant. Use a product from [employer] List N: Disinfectants for Coronavirus (COVID-19). Be sure to follow the instructions on the label to ensure safe and effective use of the product. Many products recommend keeping the surface wet with a disinfectant for a certain period of time (look at "contact time" on the product label). You may also need to wear personal protective equipment, such as gloves, depending on the directions on the product label. Immediately after disinfecting, wash your hands with soap and water for 20 seconds. For completed guidance on cleaning and disinfecting your home, visit Complete Disinfection Guidance. Take steps to improve ventilation at home Improve ventilation (air flow) at home to help prevent from spreading COVID-19 to other people in your household. Clear out COVID-19 virus particles in the air by opening windows, using air filters, and  turning on fans in your home. Use this interactive tool to learn how to improve air flow in your home. When you can be around others after being sick with COVID-19 Deciding when you can be around others is different for different situations. Find out when you can safely end home isolation. For any additional questions about your care, contact your healthcare provider or state or local health department. 03/25/2020 Content source: [facility] for Immunization and Respiratory Diseases ([facility]), Division of Viral Diseases This information is not intended to replace advice given to you by your health care provider. Make sure you discuss any questions you have with your health care provider. Document Revised: 05/08/2020 Document Reviewed: 05/08/2020 Elsevier Patient Education  2022 Else[employer] Inc.  Molnupiravir Oral Capsules What is this medication? MOLNUPIRAVIR (mol nue pir a vir) treats COVID-19. It is an antiviral medication. It may  decrease the risk of developing severe symptoms of COVID-19. It may also decrease the chance of going to the hospital. This medication is not approved by the FDA. The FDA has authorized emergency use of this medication during the COVID-19 pandemic. This medicine may be used for other purposes; ask your health care provider or pharmacist if you have questions. COMMON BRAND NAME(S): LAGEVRIO What should I tell my care team before I take this medication? They need to know if you have any of these conditions: Any allergies Any serious illness An unusual or allergic reaction to molnupiravir, other medications, foods, dyes, or preservatives Pregnant or trying to get pregnant Breast-feeding How should I use this medication? Take this medication by mouth with water. Take it as directed on the prescription label at the same time every day. Do not cut, crush or chew this medication. Swallow the capsules whole. You can take it with or without food. If it upsets your stomach,  take it with food. Take all of this medication unless your care team tells you to stop it early. Keep taking it even if you think you are better. Talk to your care team about the use of this medication in children. Special care may be needed. Overdosage: If you think you have taken too much of this medicine contact a poison control center or emergency room at once. NOTE: This medicine is only for you. Do not share this medicine with others. What if I miss a dose? If you miss a dose, take it as soon as you can unless it is more than 10 hours late. If it is more than 10 hours late, skip the missed dose. Take the next dose at the normal time. Do not take extra or 2 doses at the same time to make up for the missed dose. What may interact with this medication? Interactions have not been studied. This list may not describe all possible interactions. Give your health care provider a list of all the medicines, herbs, non-prescription drugs, or dietary supplements you use. Also tell them if you smoke, drink alcohol, or use illegal drugs. Some items may interact with your medicine. What should I watch for while using this medication? Your condition will be monitored carefully while you are receiving this medication. Visit your care team for regular checkups. Tell your care team if your symptoms do not start to get better or if they get worse. Do not become pregnant while taking this medication. You may need a pregnancy test before starting this medication. Women must use a reliable form of birth control while taking this medication and for 4 days after stopping the medication. Women should inform their care team if they wish to become pregnant or think they might be pregnant. Men should not father a child while taking this medication and for 3 months after stopping it. There is potential for serious harm to an unborn child. Talk to your care team for more information. Do not breast-feed an infant while taking this  medication and for 4 days after stopping the medication. What side effects may I notice from receiving this medication? Side effects that you should report to your care team as soon as possible: Allergic reactions-skin rash, itching, hives, swelling of the face, lips, tongue, or throat Side effects that usually do not require medical attention (report these to your care team if they continue or are bothersome): Diarrhea Dizziness Nausea This list may not describe all possible side effects. Call your doctor for medical  advice about side effects. You may report side effects to FDA at 1-800-FDA-1088. Where should I keep my medication? Keep out of the reach of children and pets. Store at room temperature between 20 and 25 degrees C (68 and 77 degrees F). Get rid of any unused medication after the expiration date. To get rid of medications that are no longer needed or have expired: Take the medication to a medication take-back program. Check with your pharmacy or law enforcement to find a location. If you cannot return the medication, check the label or package insert to see if the medication should be thrown out in the garbage or flushed down the toilet. If you are not sure, ask your care team. If it is safe to put it in the trash, take the medication out of the container. Mix the medication with cat litter, dirt, coffee grounds, or other unwanted substance. Seal the mixture in a bag or container. Put it in the trash. NOTE: This sheet is a summary. It may not cover all possible information. If you have questions about this medicine, talk to your doctor, pharmacist, or health care provider.  2022 Elsevier/Gold Standard (2019-12-31 16:16:01)

## 2020-09-19 NOTE — Addendum Note (Signed)
Addended by: Freddy Finner on: 09/19/2020 09:29 AM   Modules accepted: Level of Service

## 2020-09-19 NOTE — Progress Notes (Signed)
Beth Rivera  Needs video visit to discuss covid

## 2020-10-15 ENCOUNTER — Other Ambulatory Visit: Payer: Self-pay | Admitting: Family Medicine

## 2020-10-29 ENCOUNTER — Encounter: Payer: Self-pay | Admitting: Neurology

## 2020-11-07 ENCOUNTER — Other Ambulatory Visit: Payer: Self-pay | Admitting: Family Medicine

## 2020-11-07 DIAGNOSIS — G500 Trigeminal neuralgia: Secondary | ICD-10-CM

## 2020-12-03 ENCOUNTER — Encounter: Payer: Self-pay | Admitting: Neurology

## 2020-12-05 ENCOUNTER — Other Ambulatory Visit: Payer: Self-pay

## 2020-12-05 ENCOUNTER — Encounter: Payer: Self-pay | Admitting: Neurology

## 2020-12-05 ENCOUNTER — Ambulatory Visit: Payer: No Typology Code available for payment source | Admitting: Neurology

## 2020-12-05 VITALS — BP 136/90 | HR 96 | Ht 66.0 in | Wt 148.0 lb

## 2020-12-05 DIAGNOSIS — R202 Paresthesia of skin: Secondary | ICD-10-CM | POA: Diagnosis not present

## 2020-12-05 DIAGNOSIS — G500 Trigeminal neuralgia: Secondary | ICD-10-CM | POA: Diagnosis not present

## 2020-12-05 NOTE — Patient Instructions (Signed)
Nerve of the left arm and leg  ELECTROMYOGRAM AND NERVE CONDUCTION STUDIES (EMG/NCS) INSTRUCTIONS  How to Prepare The neurologist conducting the EMG will need to know if you have certain medical conditions. Tell the neurologist and other EMG lab personnel if you: Have a pacemaker or any other electrical medical device Take blood-thinning medications Have hemophilia, a blood-clotting disorder that causes prolonged bleeding Bathing Take a shower or bath shortly before your exam in order to remove oils from your skin. Don't apply lotions or creams before the exam.  What to Expect You'll likely be asked to change into a hospital gown for the procedure and lie down on an examination table. The following explanations can help you understand what will happen during the exam.  Electrodes. The neurologist or a technician places surface electrodes at various locations on your skin depending on where you're experiencing symptoms. Or the neurologist may insert needle electrodes at different sites depending on your symptoms.  Sensations. The electrodes will at times transmit a tiny electrical current that you may feel as a twinge or spasm. The needle electrode may cause discomfort or pain that usually ends shortly after the needle is removed. If you are concerned about discomfort or pain, you may want to talk to the neurologist about taking a short break during the exam.  Instructions. During the needle EMG, the neurologist will assess whether there is any spontaneous electrical activity when the muscle is at rest - activity that isn't present in healthy muscle tissue - and the degree of activity when you slightly contract the muscle.  He or she will give you instructions on resting and contracting a muscle at appropriate times. Depending on what muscles and nerves the neurologist is examining, he or she may ask you to change positions during the exam.  After your EMG You may experience some temporary, minor  bruising where the needle electrode was inserted into your muscle. This bruising should fade within several days. If it persists, contact your primary care doctor.

## 2020-12-05 NOTE — Progress Notes (Signed)
[facility] Neurology Division Clinic Note - Initial Visit   Date: 12/05/20  Beth Rivera MRN: [id number] DOB: 11/05/1970   Dear Dr.Koberlein:  Thank you for your kind referral of Beth Rivera for consultation of left trigeminal neuralgia. Although her history is well known to you, please allow [place] to reiterate it for the purpose of our medical record. The patient was accompanied to the clinic by self.   History of Present Illness: Beth Rivera is a 50 y.o. female presenting for evaluation of left trigeminal neuralgia and generalized numbness/tingling/pain.   She was diagnosed with left trigeminal neuralgia in 2019. MRI brain was negative for trigeminal nerve compression. She has tried carbamazepine, gabapentin, and Cymbalta. She has been taking Cymbalta 60mg  BID since January 2022 which has helped, but she continues to have daily episodic left facial pain lasting about 20 seconds.  She also complains of generalized pain, numbness, tingling involving all the limbs, worse on the right side. No weakness. MRI cervical spine from 2021 shows degenerative spondylosis with canal stenosis at C3-4 through C5-6, along with biforaminal narrowing at C4-5, right C5-6, and bilateral C6-7.  She has not done physical therapy.   She does not work.  Nonsmoker. Drink alcohol several times per week.    Out-side paper records, electronic medical record, and images have been reviewed where available and summarized as:  MRI brain wwo contrast 07/25/2017: 1. No acute intracranial abnormality. Please note that this exam does not evaluate the cranial nerves in detail.  2. Circumferential mucosal thickening at the left maxillary sinus. Correlate for sinus disease.  MRI brain wwo contrast 07/25/2017:  Normal pre- and postcontrast MRI of the brain. No abnormality to correspond with left sided trigeminal neuralgia.   There is symmetric abnormal signal intensity hyperintense on T2 in the  premaxillary soft tissues, above the upper lip and above the nasal bridge most consistent with prior cosmetic injections. Correlate clinically.  MRI cervical spine 08/31/2019: 1. Degenerative spondylosis from C3-4 through C6-7. Canal narrowing with AP diameter of the canal 8.8 mm at the C3-4 level, 7.6 mm at  C4-5, 7.4 mm at C5-6 and 7.8 mm at C6-7. Mild cord deformity on the right at the C5-6 level. No abnormal cord signal.  2. Foraminal narrowing that could cause neural compression bilaterally at C4-5, on the right at C5-6 and bilateral at C6-7.  3. Mild discogenic endplate marrow changes at C4-5 and C5-6 that could relate to neck pain.    Lab Results  Component Value Date   VITAMINB12 695 12/21/2018   Lab Results  Component Value Date   TSH 1.65 01/18/2018   Lab Results  Component Value Date   ESRSEDRATE 5 01/10/2019    Past Medical History:  Diagnosis Date   Asthma    Chronic cholecystitis    Closed fracture of phalanx of left fifth toe with delayed healing 12/14/2017   Complication of anesthesia    Cough LAST 3 WEEKS   CLEAR SPUTUM, NO FEVER    Depression    Eczema    Family history of adverse reaction to anesthesia    SON HAS PONV   Guttate psoriasis 1993   IgA deficiency (HCC) 2019   Neck pain    PMS (premenstrual syndrome)    PONV (postoperative nausea and vomiting)    Poor sleep pattern    Psoriasis    GUTATE, MOSTLY ON LIMBS   Recurrent upper respiratory infection (URI)    Shoulder pain    Sinus mucosal thickening  Tinnitus    BOTH EARS   Trigeminal neuralgia 03/11/2017    Past Surgical History:  Procedure Laterality Date   CHOLECYSTECTOMY N/A 02/03/2015   Procedure: LAPAROSCOPIC CHOLECYSTECTOMY WITH INTRAOPERATIVE CHOLANGIOGRAM;  Surgeon: Darnell Level, MD;  Location: [facility];  Service: General;  Laterality: N/A;   WISDOM TOOTH EXTRACTION       Medications:  Outpatient Encounter Medications as of 12/05/2020  Medication Sig Note   albuterol (PROAIR  HFA) 108 (90 Base) MCG/ACT inhaler Inhale 2 puffs every 6 hours IF NEEDED for wheeze/ shortness of breath    amphetamine-dextroamphetamine (ADDERALL) 10 MG tablet 1 or 2 daily as needed for sleepiness    Cyanocobalamin (VITAMIN B12 PO) Take by mouth. 3 times a week    cyclobenzaprine (FLEXERIL) 10 MG tablet TAKE 1 TABLET BY MOUTH AT BEDTIME AS NEEDED FOR MUSCLE SPASMS.    DULoxetine (CYMBALTA) 60 MG capsule duloxetine 60 mg capsule,delayed release  TAKE 1 CAPSULE BY MOUTH TWICE A DAY    Ferrous Sulfate (IRON) 325 (65 Fe) MG TABS Take 1 tablet (325 mg total) by mouth 2 (two) times daily with a meal. 01/10/2019: Takes 3 x a week due to constipation   fluticasone (FLOVENT HFA) 110 MCG/ACT inhaler Inhale 2 puffs, then rinse moth, twice daily- maintenance    traZODone (DESYREL) 50 MG tablet trazodone 50 mg tablet  TAKE 1 TO 2 TABLETS BY MOUTH NIGHTLY AS NEEDED FOR SLEEP    [DISCONTINUED] DULoxetine (CYMBALTA) 30 MG capsule TAKE 1 CAPSULE BY MOUTH EVERY DAY (Patient not taking: Reported on 12/05/2020)    Facility-Administered Encounter Medications as of 12/05/2020  Medication   0.9 %  sodium chloride infusion    Allergies:  Allergies  Allergen Reactions   Other     Seasonal, dogs    Family History: Family History  Problem Relation Age of Onset   Heart disease Paternal Grandfather    Diabetes Paternal Grandfather    Heart disease Paternal Grandmother    Diabetes Paternal Grandmother    Obesity Father    CAD Father        3 stents   Thyroid disease Mother    Hepatitis C Mother        contracted from blood transfusion   Skin cancer Mother    Thyroid disease Maternal Aunt    Breast cancer Maternal Aunt    Thyroid disease Maternal Aunt    Other Paternal Aunt        joint problems   Depression Brother     Social History: Social History   Tobacco Use   Smoking status: Never   Smokeless tobacco: Never  Vaping Use   Vaping Use: Never used  Substance Use Topics   Alcohol use: Yes     Alcohol/week: 2.0 - 4.0 standard drinks    Types: 2 - 4 Standard drinks or equivalent per week    Comment: OCCASIONAL   Drug use: No   Social History   Social History Narrative   Fun: Biking, hiking and gardening.   Denies abuse and feels safe at home.    Drinks 1 cup of coffee and green tea a day       Right Handed and Left Handed    Lives in a two story home     Vital Signs:  BP 136/90   Pulse 96   Ht 5\' 6"  (1.676 m)   Wt 148 lb (67.1 kg)   SpO2 96%   BMI 23.89 kg/m   Neurological Exam: MENTAL  STATUS including orientation to time, place, person, recent and remote memory, attention span and concentration, language, and fund of knowledge is normal.  Speech is not dysarthric.  CRANIAL NERVES: II:  No visual field defects.     III-IV-VI: Pupils equal round and reactive to light.  Normal conjugate, extra-ocular eye movements in all directions of gaze.  No nystagmus.  No ptosis.   V:  Normal facial sensation.    VII:  Normal facial symmetry and movements.   VIII:  Normal hearing and vestibular function.   IX-X:  Normal palatal movement.   XI:  Normal shoulder shrug and head rotation.   XII:  Normal tongue strength and range of motion, no deviation or fasciculation.  MOTOR:  Motor strength is 5/5 throughout.  No atrophy, fasciculations or abnormal movements.  No pronator drift.   MSRs:  Right        Left                  brachioradialis 2+  2+  biceps 2+  2+  triceps 2+  2+  patellar 2+  2+  ankle jerk 2+  2+  Hoffman no  no  plantar response down  down   SENSORY:  Normal and symmetric perception of light touch, pinprick, vibration, and proprioception.  Romberg's sign absent.   COORDINATION/GAIT: Normal finger-to- nose-finger and heel-to-shin.  Intact rapid alternating movements bilaterally.  Gait narrow based and stable. Tandem and stressed gait intact.    IMPRESSION: Left trigeminal neuralgia without vascular compression, stable  - Previously tried:  carbamazepine, gabapentin - Continue Cymbalta 60mg  BID  - Consider baclofen or oxcarbamazepine going forward  2.  Generalized paresthesias, normal neurological exam. MRI cervical spine canal stenosis at C3-4 - C5-6  - NCS/EMG of the left arm and leg  - Based on results, neck PT can be ordered  Further recommendations pending results.    Thank you for allowing me to participate in patient's care.  If I can answer any additional questions, I would be pleased to Beth Rivera so.    Sincerely,    Beth Mcgeachy K. , Beth Rivera

## 2020-12-12 ENCOUNTER — Encounter: Payer: No Typology Code available for payment source | Admitting: Family Medicine

## 2021-01-13 ENCOUNTER — Ambulatory Visit: Payer: No Typology Code available for payment source | Admitting: Neurology

## 2021-01-13 ENCOUNTER — Other Ambulatory Visit: Payer: Self-pay

## 2021-01-13 DIAGNOSIS — R202 Paresthesia of skin: Secondary | ICD-10-CM

## 2021-01-13 DIAGNOSIS — G500 Trigeminal neuralgia: Secondary | ICD-10-CM

## 2021-01-13 NOTE — Procedures (Signed)
[facility] Neurology  [address], [address]  [place], [place] [address] Tel: [phone] Fax:  [phone] Test Date:  01/13/2021  Patient: Beth Rivera DOB: 09/22/1970 Physician: Nita Sickle, DO  Sex: Female Height: 5\' 6"  Ref Phys: , DO  ID#: Nita Sickle   Technician:    Patient Complaints: This is a 51 year old female referred for evaluation of generalized paresthesias of the arms and legs.  NCV & EMG Findings: Extensive electrodiagnostic testing of the right upper and lower extremity shows:  All sensory responses including the left median, ulnar, mixed palmar, sural, and superficial peroneal nerves are within normal limits. All motor responses including the left median, ulnar, peroneal, and tibial nerves are within normal limits. Left tibial H reflex study is within normal limits. There is no evidence of active or chronic motor axonal loss changes affecting any of the tested muscles.  Motor unit configuration and recruitment pattern is within normal limits.  Impression: This is a normal study of the left upper and lower extremities.  In particular, there is no evidence of a sensorimotor polyneuropathy or cervical/lumbosacral radiculopathy.   ___________________________ 54, DO    Nerve Conduction Studies Anti Sensory Summary Table   Stim Site NR Peak (ms) Norm Peak (ms) P-T Amp (V) Norm P-T Amp  Left Median Anti Sensory (2nd Digit)  34C  Wrist    3.3 <3.4 28.0 >20  Left Sup Peroneal Anti Sensory (Ant Lat Mall)  34C  12 cm    2.8 <4.5 8.7 >5  Left Sural Anti Sensory (Lat Mall)  34C  Calf    2.5 <4.5 20.2 >5  Left Ulnar Anti Sensory (5th Digit)  34C  Wrist    2.7 <3.1 37.4 >12   Motor Summary Table   Stim Site NR Onset (ms) Norm Onset (ms) O-P Amp (mV) Norm O-P Amp Site1 Site2 Delta-0 (ms) Dist (cm) Vel (m/s) Norm Vel (m/s)  Left Median Motor (Abd Poll Brev)  34C  Wrist    3.1 <3.9 12.3 >6 Elbow Wrist 5.3 27.0 51 >50  Elbow    8.4   11.7         Left Peroneal Motor (Ext Dig Brev)  34C  Ankle    4.4 <5.5 4.5 >3 B Fib Ankle 6.9 32.0 46 >40  B Fib    11.3  3.8  Poplt B Fib 1.4 7.0 50 >40  Poplt    12.7  3.6         Left Tibial Motor (Abd Hall Brev)  34C  Ankle    3.1 <6.0 23.9 >8 Knee Ankle 8.9 40.0 45 >40  Knee    12.0  18.9         Left Ulnar Motor (Abd Dig Minimi)  34C  Wrist    2.8 <3.1 9.9 >7 B Elbow Wrist 3.8 21.0 55 >50  B Elbow    6.6  9.0  A Elbow B Elbow 1.6 10.0 63 >50  A Elbow    8.2  8.8          Comparison Summary Table   Stim Site NR Peak (ms) Norm Peak (ms) P-T Amp (V) Site1 Site2 Delta-P (ms) Norm Delta (ms)  Left Median/Ulnar Palm Comparison (Wrist - 8cm)  34C  Median Palm    1.7 <2.2 86.7 Median Palm Ulnar Palm 0.1   Ulnar Palm    1.6 <2.2 14.7       H Reflex Studies   NR H-Lat (ms) Lat Norm (ms)  L-R H-Lat (ms)  Left Tibial (Gastroc)  34C     33.06 <35    EMG   Side Muscle Ins Act Fibs Psw Fasc Number Recrt Dur Dur. Amp Amp. Poly Poly. Comment  Left AntTibialis Nml Nml Nml Nml Nml Nml Nml Nml Nml Nml Nml Nml N/A  Left Gastroc Nml Nml Nml Nml Nml Nml Nml Nml Nml Nml Nml Nml N/A  Left Flex Dig Long Nml Nml Nml Nml Nml Nml Nml Nml Nml Nml Nml Nml N/A  Left RectFemoris Nml Nml Nml Nml Nml Nml Nml Nml Nml Nml Nml Nml N/A  Left GluteusMed Nml Nml Nml Nml Nml Nml Nml Nml Nml Nml Nml Nml N/A  Left 1stDorInt Nml Nml Nml Nml Nml Nml Nml Nml Nml Nml Nml Nml N/A  Left PronatorTeres Nml Nml Nml Nml Nml Nml Nml Nml Nml Nml Nml Nml N/A  Left Biceps Nml Nml Nml Nml Nml Nml Nml Nml Nml Nml Nml Nml N/A  Left Triceps Nml Nml Nml Nml Nml Nml Nml Nml Nml Nml Nml Nml N/A  Left Deltoid Nml Nml Nml Nml Nml Nml Nml Nml Nml Nml Nml Nml N/A      Waveforms:

## 2021-01-23 ENCOUNTER — Other Ambulatory Visit: Payer: Self-pay | Admitting: Family Medicine

## 2021-01-23 DIAGNOSIS — G500 Trigeminal neuralgia: Secondary | ICD-10-CM

## 2021-03-02 ENCOUNTER — Encounter: Payer: Self-pay | Admitting: Gastroenterology

## 2021-03-02 ENCOUNTER — Other Ambulatory Visit: Payer: Self-pay | Admitting: Family Medicine

## 2021-03-02 DIAGNOSIS — G500 Trigeminal neuralgia: Secondary | ICD-10-CM

## 2021-03-09 ENCOUNTER — Ambulatory Visit (AMBULATORY_SURGERY_CENTER): Payer: No Typology Code available for payment source

## 2021-03-09 ENCOUNTER — Other Ambulatory Visit: Payer: Self-pay

## 2021-03-09 VITALS — Ht 67.0 in | Wt 145.0 lb

## 2021-03-09 DIAGNOSIS — Z1211 Encounter for screening for malignant neoplasm of colon: Secondary | ICD-10-CM

## 2021-03-09 MED ORDER — NA SULFATE-K SULFATE-MG SULF 17.5-3.13-1.6 GM/177ML PO SOLN: 1.0000 | Freq: Once | ORAL | 0 refills | Status: AC

## 2021-03-09 NOTE — Progress Notes (Signed)
No egg or soy allergy known to patient  ?No issues known to pt with past sedation with any surgeries or procedures ?Patient denies ever being told they had issues or difficulty with intubation  ?No FH of Malignant Hyperthermia ?Pt is not on diet pills ?Pt is not on home 02  ?Pt is not on blood thinners  ?Pt denies issues with constipation at this time;   ?No A fib or A flutter ?Pt is fully vaccinated for Covid x 2 + boosters; ?NO PA's for preps discussed with pt in PV today  ?Discussed with pt there will be an out-of-pocket cost for prep and that varies from $0 to 70 + dollars - pt verbalized understanding  ?Due to the COVID-19 pandemic we are asking patients to follow certain guidelines in PV and the [facility]   ?Pt aware of COVID protocols and [facility] guidelines  ?PV completed over the phone. Pt verified name, DOB, address and insurance during PV today.  ?Pt mailed instruction packet with copy of consent form to read and not return, and instructions.  ?Pt encouraged to call with questions or issues.  ?If pt has My chart, procedure instructions sent via My Chart  ? ?

## 2021-03-10 ENCOUNTER — Encounter: Payer: Self-pay | Admitting: Gastroenterology

## 2021-03-23 ENCOUNTER — Ambulatory Visit (AMBULATORY_SURGERY_CENTER): Payer: No Typology Code available for payment source | Admitting: Gastroenterology

## 2021-03-23 ENCOUNTER — Encounter: Payer: Self-pay | Admitting: Gastroenterology

## 2021-03-23 ENCOUNTER — Encounter: Payer: No Typology Code available for payment source | Admitting: Gastroenterology

## 2021-03-23 VITALS — BP 129/87 | HR 80 | Temp 98.1°F | Resp 14 | Ht 67.0 in | Wt 145.0 lb

## 2021-03-23 DIAGNOSIS — Z1211 Encounter for screening for malignant neoplasm of colon: Secondary | ICD-10-CM | POA: Diagnosis present

## 2021-03-23 MED ORDER — SODIUM CHLORIDE 0.9 % IV SOLN: 500.0000 mL | INTRAVENOUS | Status: DC

## 2021-03-23 NOTE — Op Note (Signed)
[facility] ?Patient Name: Beth Rivera ?Procedure Date: 03/23/2021 10:13 AM ?MRN: [id number] ?Endoscopist: Ladene Artist , MD ?Age: 51 ?Referring MD:  ?Date of Birth: [birth year] ?Gender: Female ?Account #: [id number] ?Procedure:                Colonoscopy ?Indications:              Screening for colorectal malignant neoplasm ?Medicines:                Monitored Anesthesia Care ?Procedure:                Pre-Anesthesia Assessment: ?                          - Prior to the procedure, a History and Physical  ?                          was performed, and patient medications and  ?                          allergies were reviewed. The patient's tolerance of  ?                          previous anesthesia was also reviewed. The risks  ?                          and benefits of the procedure and the sedation  ?                          options and risks were discussed with the patient.  ?                          All questions were answered, and informed consent  ?                          was obtained. Prior Anticoagulants: The patient has  ?                          taken no previous anticoagulant or antiplatelet  ?                          agents. ASA Grade Assessment: II - A patient with  ?                          mild systemic disease. After reviewing the risks  ?                          and benefits, the patient was deemed in  ?                          satisfactory condition to undergo the procedure. ?                          After obtaining informed consent, the colonoscope  ?  was passed under direct vision. Throughout the  ?                          procedure, the patient's blood pressure, pulse, and  ?                          oxygen saturations were monitored continuously. The  ?                          [id number]-HQ190L Colonoscope was introduced through the  ?                          anus and advanced to the the cecum, identified by  ?                          appendiceal  orifice and ileocecal valve. The  ?                          ileocecal valve, appendiceal orifice, and rectum  ?                          were photographed. The quality of the bowel  ?                          preparation was good. The colonoscopy was performed  ?                          without difficulty. The patient tolerated the  ?                          procedure well. ?Scope In: 10:22:50 AM ?Scope Out: 10:38:53 AM ?Scope Withdrawal Time: 0 hours 12 minutes 35 seconds  ?Total Procedure Duration: 0 hours 16 minutes 3 seconds  ?Findings:                 The perianal and digital rectal examinations were  ?                          normal. ?                          External hemorrhoids were found during  ?                          retroflexion. The hemorrhoids were moderate. ?                          The exam was otherwise without abnormality on  ?                          direct and retroflexion views. ?Complications:            No immediate complications. Estimated blood loss:  ?                          None. ?Estimated Blood Loss:  Estimated blood loss: none. ?Impression:               - External hemorrhoids. ?                          - The examination was otherwise normal on direct  ?                          and retroflexion views. ?                          - No specimens collected. ?Recommendation:           - Repeat colonoscopy in 10 years for screening  ?                          purposes. ?                          - Patient has a contact number available for  ?                          emergencies. The signs and symptoms of potential  ?                          delayed complications were discussed with the  ?                          patient. Return to normal activities tomorrow.  ?                          Written discharge instructions were provided to the  ?                          patient. ?                          - Resume previous diet. ?                          - Continue present  medications. ?Ladene Artist, MD ?03/23/2021 10:41:10 AM ?This report has been signed electronically. ?

## 2021-03-23 NOTE — Progress Notes (Signed)
See 03/04/2021 H&P, no changes.  ?

## 2021-03-23 NOTE — Progress Notes (Signed)
Pt's states no medical or surgical changes since previsit or office visit. 

## 2021-03-23 NOTE — Patient Instructions (Signed)
Handout on hemorrhoids given.  ?Resume previous diet and continue present medications. ?Repeat colonoscopy in 10 years for surveillance purposes! ? ?YOU HAD AN ENDOSCOPIC PROCEDURE TODAY AT THE [facility]:   Refer to the procedure report that was given to you for any specific questions about what was found during the examination.  If the procedure report does not answer your questions, please call your gastroenterologist to clarify.  If you requested that your care partner not be given the details of your procedure findings, then the procedure report has been included in a sealed envelope for you to review at your convenience later. ? ?YOU SHOULD EXPECT: Some feelings of bloating in the abdomen. Passage of more gas than usual.  Walking can help get rid of the air that was put into your GI tract during the procedure and reduce the bloating. If you had a lower endoscopy (such as a colonoscopy or flexible sigmoidoscopy) you may notice spotting of blood in your stool or on the toilet paper. If you underwent a bowel prep for your procedure, you may not have a normal bowel movement for a few days. ? ?Please Note:  You might notice some irritation and congestion in your nose or some drainage.  This is from the oxygen used during your procedure.  There is no need for concern and it should clear up in a day or so. ? ?SYMPTOMS TO REPORT IMMEDIATELY: ? ?Following lower endoscopy (colonoscopy or flexible sigmoidoscopy): ? Excessive amounts of blood in the stool ? Significant tenderness or worsening of abdominal pains ? Swelling of the abdomen that is new, acute ? Fever of 100?F or higher ? ?For urgent or emergent issues, a gastroenterologist can be reached at any hour by calling [phone]. ?Do not use MyChart messaging for urgent concerns.  ? ? ?DIET:  We do recommend a small meal at first, but then you may proceed to your regular diet.  Drink plenty of fluids but you should avoid alcoholic beverages for 24  hours. ? ?ACTIVITY:  You should plan to take it easy for the rest of today and you should NOT DRIVE or use heavy machinery until tomorrow (because of the sedation medicines used during the test).   ? ?FOLLOW UP: ?Our staff will call the number listed on your records 48-72 hours following your procedure to check on you and address any questions or concerns that you may have regarding the information given to you following your procedure. If we do not reach you, we will leave a message.  We will attempt to reach you two times.  During this call, we will ask if you have developed any symptoms of COVID 19. If you develop any symptoms (ie: fever, flu-like symptoms, shortness of breath, cough etc.) before then, please call [phone].  If you test positive for Covid 19 in the 2 weeks post procedure, please call and report this information to [place].   ? ?If any biopsies were taken you will be contacted by phone or by letter within the next 1-3 weeks.  Please call us at [phone] if you have not heard about the biopsies in 3 weeks.  ? ? ?SIGNATURES/CONFIDENTIALITY: ?You and/or your care partner have signed paperwork which will be entered into your electronic medical record.  These signatures attest to the fact that that the information above on your After Visit Summary has been reviewed and is understood.  Full responsibility of the confidentiality of this discharge information lies with you and/or your  care-partner.  ?

## 2021-03-23 NOTE — Progress Notes (Signed)
Report given to PACU, vss 

## 2021-03-24 ENCOUNTER — Other Ambulatory Visit: Payer: Self-pay | Admitting: Family Medicine

## 2021-03-24 DIAGNOSIS — G500 Trigeminal neuralgia: Secondary | ICD-10-CM

## 2021-03-25 ENCOUNTER — Telehealth: Payer: Self-pay

## 2021-03-25 MED ORDER — CYCLOBENZAPRINE HCL 10 MG PO TABS: ORAL_TABLET | ORAL | 0 refills | Status: DC

## 2021-03-25 NOTE — Telephone Encounter (Signed)
?  Follow up Call- ? ?Call back number 03/23/2021  ?Post procedure Call Back phone  # [phone]  ?Permission to leave phone message Yes  ?Some recent data might be hidden  ?  ? ?Patient questions: ? ?Do you have a fever, pain , or abdominal swelling? No. ?Pain Score  0 * ? ?Have you tolerated food without any problems? Yes.   ? ?Have you been able to return to your normal activities? Yes.   ? ?Do you have any questions about your discharge instructions: ?Diet   No. ?Medications  No. ?Follow up visit  No. ? ?Do you have questions or concerns about your Care? No. ? ?Actions: ?* If pain score is 4 or above: ?No action needed, pain <4. ?Have you developed a fever since your procedure? no ? ?2.   Have you had an respiratory symptoms (SOB or cough) since your procedure? no ? ?3.   Have you tested positive for COVID 19 since your procedure no ? ?4.   Have you had any family members/close contacts diagnosed with the COVID 19 since your procedure?  no ? ? ?If yes to any of these questions please route to Joylene John, RN and Joella Prince, RN ? ? ?

## 2021-04-25 ENCOUNTER — Other Ambulatory Visit: Payer: Self-pay | Admitting: Family Medicine

## 2021-04-25 DIAGNOSIS — G500 Trigeminal neuralgia: Secondary | ICD-10-CM

## 2021-06-06 ENCOUNTER — Other Ambulatory Visit: Payer: Self-pay | Admitting: Family Medicine

## 2021-06-06 DIAGNOSIS — G500 Trigeminal neuralgia: Secondary | ICD-10-CM

## 2021-06-26 ENCOUNTER — Other Ambulatory Visit: Payer: Self-pay | Admitting: Family Medicine

## 2021-06-26 DIAGNOSIS — G500 Trigeminal neuralgia: Secondary | ICD-10-CM

## 2021-07-13 ENCOUNTER — Other Ambulatory Visit: Payer: Self-pay | Admitting: Internal Medicine

## 2021-07-17 ENCOUNTER — Other Ambulatory Visit: Payer: Self-pay | Admitting: Internal Medicine

## 2021-07-28 ENCOUNTER — Other Ambulatory Visit: Payer: Self-pay | Admitting: Family Medicine

## 2021-07-28 DIAGNOSIS — G500 Trigeminal neuralgia: Secondary | ICD-10-CM

## 2021-08-19 ENCOUNTER — Encounter: Payer: Self-pay | Admitting: Neurology

## 2021-08-24 ENCOUNTER — Other Ambulatory Visit: Payer: Self-pay | Admitting: Family Medicine

## 2021-08-24 DIAGNOSIS — G500 Trigeminal neuralgia: Secondary | ICD-10-CM

## 2021-09-14 ENCOUNTER — Encounter: Payer: Self-pay | Admitting: Neurology

## 2021-09-14 ENCOUNTER — Ambulatory Visit: Payer: No Typology Code available for payment source | Admitting: Neurology

## 2021-09-14 VITALS — BP 131/85 | HR 88 | Ht 67.0 in | Wt 148.0 lb

## 2021-09-14 DIAGNOSIS — M542 Cervicalgia: Secondary | ICD-10-CM | POA: Diagnosis not present

## 2021-09-14 DIAGNOSIS — G500 Trigeminal neuralgia: Secondary | ICD-10-CM | POA: Diagnosis not present

## 2021-09-14 DIAGNOSIS — R200 Anesthesia of skin: Secondary | ICD-10-CM | POA: Diagnosis not present

## 2021-09-14 MED ORDER — CYCLOBENZAPRINE HCL 10 MG PO TABS: 10.0000 mg | ORAL_TABLET | Freq: Every day | ORAL | 5 refills | Status: DC

## 2021-09-14 NOTE — Progress Notes (Signed)
Follow-up Visit   Date: 09/14/2021    Beth Rivera MRN: [id number] DOB: 07/25/1970    Beth Rivera is a 50 y.o. Caucasian female returning to the clinic for follow-up of left trigeminal neuralgia.  The patient was accompanied to the clinic by self.   IMPRESSION/PLAN: Right 3rd and 4th finger numbness - ?entrapment neuropathy - NCS/EMG of the right arm - Consider nerve ultrasound going forward  2.  Left trigeminal neuralgia, stable  - Continue Cymbalta 60mg  BID              - Consider baclofen or oxcarbamazepine going forward  3.  Cervicalgia - Start neck PT - Continue flexeril 10mg  qhs   Return to clinic in 6 month  --------------------------------------------- History of present illness: She was diagnosed with left trigeminal neuralgia in 2019. MRI brain was negative for trigeminal nerve compression. She has tried carbamazepine, gabapentin, and Cymbalta. She has been taking Cymbalta 60mg  BID since January 2022 which has helped, but she continues to have daily episodic left facial pain lasting about 20 seconds.  She also complains of generalized pain, numbness, tingling involving all the limbs, worse on the right side. No weakness. MRI cervical spine from 2021 shows degenerative spondylosis with canal stenosis at C3-4 through C5-6, along with biforaminal narrowing at C4-5, right C5-6, and bilateral C6-7.  She has not done physical therapy.   She does not work.  Nonsmoker. Drink alcohol several times per week.   UPDATE 09/14/2021: She is here for follow-up.   She continues to have episodic pain flares with left trigeminal neuralgia, which is overall stable.  Today, she complains of new numbness of the 3rd and 4th fingers which has been constant since August 1st.  She noticed the numbness during a 3-hr road trip.  She denies weakness of the hand.  She has chronic neck pain, no shooting pain down the arms.  Medications:  Current Outpatient Medications on File Prior to  Visit  Medication Sig Dispense Refill   albuterol (PROAIR HFA) 108 (90 Base) MCG/ACT inhaler Inhale 2 puffs every 6 hours IF NEEDED for wheeze/ shortness of breath 18 g 12   amphetamine-dextroamphetamine (ADDERALL) 10 MG tablet 1 or 2 daily as needed for sleepiness 60 tablet 0   cetirizine (ZYRTEC) 10 MG tablet Take 1 tablet by mouth daily.     DULoxetine (CYMBALTA) 60 MG capsule duloxetine 60 mg capsule,delayed release  TAKE 1 CAPSULE BY MOUTH TWICE A DAY     Ferrous Sulfate (IRON) 325 (65 Fe) MG TABS Take 1 tablet (325 mg total) by mouth 2 (two) times daily with a meal. (Patient taking differently: Take 325 mg by mouth daily.) 30 each 0   fluticasone (FLOVENT HFA) 110 MCG/ACT inhaler Inhale 2 puffs, then rinse moth, twice daily- maintenance (Patient taking differently: 2 (two) times daily as needed. Inhale 2 puffs, then rinse moth, twice daily- maintenance) 1 Inhaler 12   traZODone (DESYREL) 150 MG tablet Take 150 mg by mouth at bedtime.     No current facility-administered medications on file prior to visit.    Allergies:  Allergies  Allergen Reactions   Other     Seasonal, dogs    Vital Signs:  BP 131/85   Pulse 88   Ht 5\' 7"  (1.702 m)   Wt 148 lb (67.1 kg)   SpO2 99%   BMI 23.18 kg/m   Neurological Exam: MENTAL STATUS including orientation to time, place, person, recent and remote memory, attention span and concentration, language,  and fund of knowledge is normal.  Speech is not dysarthric.  CRANIAL NERVES:  No visual field defects.  Pupils equal round and reactive to light.  Normal conjugate, extra-ocular eye movements in all directions of gaze.  No ptosis.  Face is symmetric. Palate elevates symmetrically.    MOTOR:  Motor strength is 5/5 in all extremities.  No atrophy, fasciculations or abnormal movements.  No pronator drift.  Tone is normal.    MSRs:  Reflexes are 2+/4 throughout.  SENSORY:  Intact to vibration, temperature, and pin prick  throughout.  COORDINATION/GAIT:  Normal finger-to- nose-finger.  Intact rapid alternating movements bilaterally.  Gait narrow based and stable.   Data: NCS/EMG of the left arm and leg 01/13/2021:  normal  MRI brain wwo contrast 07/25/2017: 1. No acute intracranial abnormality. Please note that this exam does not evaluate the cranial nerves in detail.  2. Circumferential mucosal thickening at the left maxillary sinus. Correlate for sinus disease.  MRI brain wwo contrast 07/25/2017:  Normal pre- and postcontrast MRI of the brain. No abnormality to correspond with left sided trigeminal neuralgia.   There is symmetric abnormal signal intensity hyperintense on T2 in the premaxillary soft tissues, above the upper lip and above the nasal bridge most consistent with prior cosmetic injections. Correlate clinically.  MRI cervical spine 08/31/2019: 1. Degenerative spondylosis from C3-4 through C6-7. Canal narrowing with AP diameter of the canal 8.8 mm at the C3-4 level, 7.6 mm at  C4-5, 7.4 mm at C5-6 and 7.8 mm at C6-7. Mild cord deformity on the right at the C5-6 level. No abnormal cord signal.  2. Foraminal narrowing that could cause neural compression bilaterally at C4-5, on the right at C5-6 and bilateral at C6-7.  3. Mild discogenic endplate marrow changes at C4-5 and C5-6 that could relate to neck pain.    Thank you for allowing me to participate in patient's care.  If I can answer any additional questions, I would be pleased to do so.    Sincerely,    Keary Waterson K. Allena Katz, DO

## 2021-09-14 NOTE — Patient Instructions (Addendum)
Start neck PT Nerve testing of the right arm  Return to clinic in 6 months  ELECTROMYOGRAM AND NERVE CONDUCTION STUDIES (EMG/NCS) INSTRUCTIONS  How to Prepare The neurologist conducting the EMG will need to know if you have certain medical conditions. Tell the neurologist and other EMG lab personnel if you: Have a pacemaker or any other electrical medical device Take blood-thinning medications Have hemophilia, a blood-clotting disorder that causes prolonged bleeding Bathing Take a shower or bath shortly before your exam in order to remove oils from your skin. Don't apply lotions or creams before the exam.  What to Expect You'll likely be asked to change into a hospital gown for the procedure and lie down on an examination table. The following explanations can help you understand what will happen during the exam.  Electrodes. The neurologist or a technician places surface electrodes at various locations on your skin depending on where you're experiencing symptoms. Or the neurologist may insert needle electrodes at different sites depending on your symptoms.  Sensations. The electrodes will at times transmit a tiny electrical current that you may feel as a twinge or spasm. The needle electrode may cause discomfort or pain that usually ends shortly after the needle is removed. If you are concerned about discomfort or pain, you may want to talk to the neurologist about taking a short break during the exam.  Instructions. During the needle EMG, the neurologist will assess whether there is any spontaneous electrical activity when the muscle is at rest - activity that isn't present in healthy muscle tissue - and the degree of activity when you slightly contract the muscle.  He or she will give you instructions on resting and contracting a muscle at appropriate times. Depending on what muscles and nerves the neurologist is examining, he or she may ask you to change positions during the exam.  After your  EMG You may experience some temporary, minor bruising where the needle electrode was inserted into your muscle. This bruising should fade within several days. If it persists, contact your primary care doctor.

## 2021-09-15 ENCOUNTER — Ambulatory Visit: Payer: No Typology Code available for payment source | Admitting: Neurology

## 2021-09-15 DIAGNOSIS — R200 Anesthesia of skin: Secondary | ICD-10-CM | POA: Diagnosis not present

## 2021-09-15 NOTE — Procedures (Signed)
[facility]  [address] [place], [address]  [place], [place] [address] Tel: [phone] Fax:  [phone] Test Date:  09/15/2021  Patient: Beth Rivera DOB: 12/25/1970 Physician: Nita Sickle, DO  Sex: Female Height: 5\' 7"  Ref Phys: , DO  ID#: Nita Sickle   Technician:    Patient Complaints: This is a 51 year old female referred for evaluation of right third and fourth finger numbness.  NCV & EMG Findings: Extensive electrodiagnostic testing of the right upper extremity shows: Right median, ulnar, and mixed palmar sensory responses are within normal limits. Right median and ulnar motor responses are within normal limits. There is no evidence of active or chronic motor axonal loss changes affecting any of the tested muscles.  Motor unit configuration and recruitment pattern is within normal limits.  Impression: This is a normal study of the right upper extremity.  In particular, there is no evidence of carpal tunnel syndrome, ulnar neuropathy, or cervical radiculopathy.   ___________________________ 44, DO    Nerve Conduction Studies Anti Sensory Summary Table   Stim Site NR Peak (ms) Norm Peak (ms) O-P Amp (V) Norm O-P Amp  Right Median Anti Sensory (2nd Digit)  35C  Wrist    2.9 <3.6 24.3 >15  Right Ulnar Anti Sensory (5th Digit)  35C  Wrist    2.5 <3.1 32.3 >10   Motor Summary Table   Stim Site NR Onset (ms) Norm Onset (ms) O-P Amp (mV) Norm O-P Amp Site1 Site2 Delta-0 (ms) Dist (cm) Vel (m/s) Norm Vel (m/s)  Right Median Motor (Abd Poll Brev)  35C  Wrist    2.7 <4.0 10.5 >6 Elbow Wrist 5.0 28.0 56 >50  Elbow    7.7  10.3         Right Ulnar Motor (Abd Dig Minimi)  35C  Wrist    2.3 <3.1 8.5 >7 B Elbow Wrist 3.4 21.0 62 >50  B Elbow    5.7  7.6  A Elbow B Elbow 1.6 10.0 63 >50  A Elbow    7.3  6.3          Comparison Summary Table   Stim Site NR Peak (ms) Norm Peak (ms) P-T Amp (V) Site1 Site2 Delta-P (ms) Norm Delta (ms)   Right Median/Ulnar Palm Comparison (Wrist - 8cm)  35C  Median Palm    1.6 <2.2 64.7 Median Palm Ulnar Palm 0.2   Ulnar Palm    1.4 <2.2 25.0       EMG   Side Muscle Ins Act Fibs Fasc Recrt Dur. Amp. Poly. Activation Comment  Right 1stDorInt Nml Nml Nml Nml Nml Nml Nml Nml N/A  Right PronatorTeres Nml Nml Nml Nml Nml Nml Nml Nml N/A  Right Biceps Nml Nml Nml Nml Nml Nml Nml Nml N/A  Right Triceps Nml Nml Nml Nml Nml Nml Nml Nml N/A  Right Deltoid Nml Nml Nml Nml Nml Nml Nml Nml N/A      Waveforms:

## 2021-10-26 ENCOUNTER — Ambulatory Visit: Payer: No Typology Code available for payment source | Admitting: Neurology

## 2021-11-30 ENCOUNTER — Other Ambulatory Visit: Payer: Self-pay | Admitting: Internal Medicine

## 2022-01-12 ENCOUNTER — Other Ambulatory Visit: Payer: Self-pay | Admitting: Family Medicine

## 2022-01-12 ENCOUNTER — Ambulatory Visit: Payer: No Typology Code available for payment source | Admitting: Family Medicine

## 2022-01-12 ENCOUNTER — Encounter: Payer: Self-pay | Admitting: Family Medicine

## 2022-01-12 VITALS — BP 110/82 | HR 114 | Temp 99.0°F | Ht 67.0 in | Wt 146.8 lb

## 2022-01-12 DIAGNOSIS — M7701 Medial epicondylitis, right elbow: Secondary | ICD-10-CM

## 2022-01-12 DIAGNOSIS — J4489 Other specified chronic obstructive pulmonary disease: Secondary | ICD-10-CM

## 2022-01-12 DIAGNOSIS — D509 Iron deficiency anemia, unspecified: Secondary | ICD-10-CM

## 2022-01-12 DIAGNOSIS — Z23 Encounter for immunization: Secondary | ICD-10-CM

## 2022-01-12 MED ORDER — FLUTICASONE PROPIONATE HFA 110 MCG/ACT IN AERO: INHALATION_SPRAY | RESPIRATORY_TRACT | 12 refills | Status: DC

## 2022-01-12 MED ORDER — METHYLPREDNISOLONE 4 MG PO TBPK: ORAL_TABLET | ORAL | 0 refills | Status: DC

## 2022-01-12 NOTE — Progress Notes (Signed)
Established Patient Office Visit  Subjective   Patient ID: Beth Rivera, female    DOB: 05/07/1970  Age: 52 y.o. MRN: [id number]  Chief Complaint  Patient presents with   Establish Care   Shortness of Breath    X3 weeks    Patient is here for transition of care visit.   Patient has a diagnosis of asthma and IgA deficiency. States that she is on flovent for this and needs refills. States that she has had a virus since Dec. 20 and it took a long time for her symptoms to improve, however she is still feeling more short of breath, not really coughing but she does occasionally gets fits of coughing. No chest pain with breathing.   Patient reports a history of chronic iron deficiency anemia and low vitamin D, states she just had her levels drawn and her iron level and % sat was low, is taking ferrous sulfate and vitamin C every other day however her levels remain low, has required iron IV infusions in the past. Would like referral to hematology for this.  Pt is reporting right medial epicondyle pain, states that it shoots down the arm to her 4th and 5th finger. States that her 3rd and 4th digits were numb but she went to physical therapy ad they worked on her shoulder and this got better over time.  States that she does play tennis regularly.      Current Outpatient Medications  Medication Instructions   albuterol (PROAIR HFA) 108 (90 Base) MCG/ACT inhaler Inhale 2 puffs every 6 hours IF NEEDED for wheeze/ shortness of breath   amphetamine-dextroamphetamine (ADDERALL) 10 MG tablet 1 or 2 daily as needed for sleepiness   Budesonide (PULMICORT FLEXHALER) 90 MCG/ACT inhaler 1 puff, Inhalation, 2 times daily   cetirizine (ZYRTEC) 10 MG tablet 1 tablet, Oral, Daily   cyclobenzaprine (FLEXERIL) 10 mg, Oral, Daily at bedtime   DULoxetine (CYMBALTA) 60 MG capsule duloxetine 60 mg capsule,delayed release  TAKE 1 CAPSULE BY MOUTH TWICE A DAY   Iron 325 mg, Oral, 2 times daily with meals    methylPREDNISolone (MEDROL DOSEPAK) 4 MG TBPK tablet Take package as directed.   OTEZLA 30 MG TABS 1 tablet, Oral, 2 times daily   traZODone (DESYREL) 150 mg, Oral, Daily at bedtime     Patient Active Problem List   Diagnosis Date Noted   Anemia 12/15/2018   Neck pain 10/20/2017   Pelvic swelling, RLQ 09/07/2017   Asthmatic bronchitis , chronic 06/28/2017   Immunodeficiency, unspecified (HCC) 06/28/2017   Unexplained weight loss 06/28/2017   Psoriasis 06/28/2017   IgA deficiency (HCC) 03/27/2017   Other allergic rhinitis 03/27/2017   Trigeminal neuralgia 03/11/2017   Chronic left shoulder pain 02/03/2017   Seasonal affective disorder (HCC) 12/11/2015   Hypersomnia 12/11/2015   Non-intractable vomiting with nausea 12/11/2015   Globus pharyngeus 12/11/2015   Cholelithiasis with chronic cholecystitis 02/02/2015      Review of Systems  All other systems reviewed and are negative.     Objective:     BP 110/82 (BP Location: Left Arm, Patient Position: Sitting, Cuff Size: Normal)   Pulse (!) 114   Temp 99 F (37.2 C) (Oral)   Ht 5\' 7"  (1.702 m)   Wt 146 lb 12.8 oz (66.6 kg)   LMP 01/05/2022 (Approximate)   SpO2 93%   BMI 22.99 kg/m    Physical Exam Vitals reviewed.  Constitutional:      Appearance: Normal appearance. She is well-groomed and  normal weight.  Eyes:     Conjunctiva/sclera: Conjunctivae normal.  Neck:     Thyroid: No thyromegaly.  Cardiovascular:     Rate and Rhythm: Normal rate and regular rhythm.     Pulses: Normal pulses.     Heart sounds: S1 normal and S2 normal.  Pulmonary:     Effort: Pulmonary effort is normal.     Breath sounds: Normal air entry. Examination of the right-middle field reveals wheezing. Examination of the left-middle field reveals wheezing. Examination of the right-lower field reveals wheezing. Examination of the left-lower field reveals wheezing. Wheezing present.  Musculoskeletal:     Right elbow: Tenderness (at the ulnar  groove on the medial side of the elbow and slightly distal to the epiycondyle) present.     Right lower leg: No edema.     Left lower leg: No edema.  Neurological:     Mental Status: She is alert and oriented to person, place, and time. Mental status is at baseline.     Gait: Gait is intact.  Psychiatric:        Mood and Affect: Mood and affect normal.        Speech: Speech normal.        Behavior: Behavior normal.        Judgment: Judgment normal.      No results found for any visits on 01/12/22.    The 10-year ASCVD risk score (Arnett DK, et al., 2019) is: 0.7%    Assessment & Plan:   Problem List Items Addressed This Visit       Unprioritized   Asthmatic bronchitis , chronic - Primary    Chronic, she has inspiratory and expiratory wheezing on exam today, likely due to the acute URI she was having previously. I will order a medrol dose pak  4 mg tablets in the tapering dose for 6 days to help resolve the residual inflammation.       Relevant Medications   methylPREDNISolone (MEDROL DOSEPAK) 4 MG TBPK tablet   Anemia    Chronic iron deficiency, pt has trouble tolerating PO iron due to bowel movement changes, will send to hematology for possible need for iron infusions.       Relevant Orders   Ambulatory referral to Hematology / Oncology   Other Visit Diagnoses     Immunization due       Relevant Orders   Zoster Recombinant (Shingrix ) (Completed)   Medial epicondylitis of elbow, right      Recommended using ibuprofen or aleve OTC and to rest the elbow, also may use an elbow strap when playing tennis. If it persists/worsens she may need to see sports medicine..     Return in about 3 months (around 04/13/2022) for Annual physical Exam with pap.    Karie Georges, MD

## 2022-01-13 NOTE — Assessment & Plan Note (Signed)
Chronic, she has inspiratory and expiratory wheezing on exam today, likely due to the acute URI she was having previously. I will order a medrol dose pak  4 mg tablets in the tapering dose for 6 days to help resolve the residual inflammation.

## 2022-01-13 NOTE — Assessment & Plan Note (Signed)
Chronic iron deficiency, pt has trouble tolerating PO iron due to bowel movement changes, will send to hematology for possible need for iron infusions.

## 2022-01-21 ENCOUNTER — Telehealth (HOSPITAL_COMMUNITY): Payer: Self-pay

## 2022-01-23 ENCOUNTER — Inpatient Hospital Stay
Payer: No Typology Code available for payment source | Attending: Hematology and Oncology | Admitting: Hematology and Oncology

## 2022-01-23 ENCOUNTER — Inpatient Hospital Stay: Payer: No Typology Code available for payment source

## 2022-01-23 VITALS — BP 121/80 | HR 117 | Temp 97.8°F | Resp 15 | Wt 145.0 lb

## 2022-01-23 DIAGNOSIS — D509 Iron deficiency anemia, unspecified: Secondary | ICD-10-CM | POA: Insufficient documentation

## 2022-01-23 DIAGNOSIS — D802 Selective deficiency of immunoglobulin A [IgA]: Secondary | ICD-10-CM | POA: Diagnosis not present

## 2022-01-23 DIAGNOSIS — Z803 Family history of malignant neoplasm of breast: Secondary | ICD-10-CM | POA: Diagnosis not present

## 2022-01-23 DIAGNOSIS — Z808 Family history of malignant neoplasm of other organs or systems: Secondary | ICD-10-CM | POA: Insufficient documentation

## 2022-01-23 NOTE — Progress Notes (Signed)
[name]  Patient Care Team: Farrel Conners, MD as PCP - General (Family Medicine) Mickel Fuchs, MD as Referring Physician (Psychiatry) Harriett Sine, MD as Consulting Physician (Dermatology) Crawford Givens, MD as Consulting Physician (Obstetrics and Gynecology) Alda Berthold, DO as Consulting Physician (Neurology)  CHIEF COMPLAINTS/PURPOSE OF CONSULTATION:  IDA  ASSESSMENT & PLAN:   This is a very pleasant 52 year old perimenopausal female patient with chronic iron deficiency, required intravenous iron infusion in the past, is on chronic oral iron supplementation referred back to hematology for consideration of IV iron since she has persistent iron deficiency with most recent ferritin of 3.  We have discussed the following details about iron deficiency.  Iron deficiency anemia affects a large proportion of the wellness population especially females of childbearing age and children.  Common causes of iron deficiency include blood loss, reduced iron absorption because of previous surgeries, dietary restrictions or other malabsorption issues, medications that reduce gastric acidity are due to inherited disorders such as IRIDA due to TMPRSS6 mutation. Symptoms of iron deficiency usually include fatigue, pica, restless legs, exercise intolerance, exertional dyspnea, headaches and weakness.  Diagnosis can be usually made by history, physical examination, CBC and iron studies.  We generally treat iron deficiency anemia with oral supplements if this can be tolerated.  IV iron is appropriate for patients are unable to tolerate due to gastrointestinal side effects or for patients with severe/ongoing blood loss, history of gastric bypass which reduces gastric acid and henceforth will impair intestinal absorption of oral iron, malabsorption syndromes are occasional in pregnancy.  There are several formularies of intravenous iron available in the market.  We have  discussed about risk of allergic/infusion reactions including potentially life-threatening anaphylaxis with intravenous iron however these serious allergic reactions are exceedingly rare and overestimated.  In contrast to serious allergic reactions, IV iron may be associated with nonallergic infusion reactions including self-limited urticaria, palpitations, dizziness, neck and back spasm which again occur in less than 1% of the individuals and do not progress to more serious reactions.  I think it is reasonable to consider intravenous iron in this patient since she has been very compliant with oral iron for several years and continues to have severe iron deficiency with most recent ferritin of 3.  I wonder if she has any malabsorption syndromes which contribute to iron deficiency.  She tolerated Feraheme very well in the past hence I have reordered the same formulary.  I will plan to see her back in 6 months with a repeat labs.   I have also encouraged her to stay up-to-date with cancer screening.   Thank you for consulting [place] in the care of this patient.  Please do not hesitate to contact us with any additional questions or concerns.  HISTORY OF PRESENTING ILLNESS:  Beth Rivera 52 y.o. female is here because of IDA  This is a very pleasant 52 yr old perimenopausal female with chronic IDA, required ferraheme in 2019, Ig A deficiency and trigeminal neuralgia referred to hematology for evaluation of iron deficiency anemia.  She has been on oral iron supplementation for at least 4 years.  She takes iron sulfate with vitamin C every other day and has been very compliant with it.  She is still has not had significant improvement in her iron deficiency anemia she wonders if she has any malabsorption issues given her IgA deficiency.  She also is mostly plant-based but consumes meat.  She had heavy menstruation in the past however  she has been having lighter and more infrequent cycles.  Last menstrual cycle  was in December.  She is up-to-date with a colonoscopy.  Last colonoscopy in March 2023 with no significant evidence of bleeding except for external hemorrhoids. She will be having her mammogram and Pap smear in March. Rest of the pertinent 10 point ROS reviewed and negative    MEDICAL HISTORY:  Past Medical History:  Diagnosis Date   Anemia    currently on iron   Anxiety    on meds   Asthma    with respiratory illnesses-uses inhaler PRN   Chronic cholecystitis    Closed fracture of phalanx of left fifth toe with delayed healing 12/14/2017   Complication of anesthesia    PONV   Cough LAST 3 WEEKS   CLEAR SPUTUM, NO FEVER    Depression    on meds   Eczema    Family history of adverse reaction to anesthesia    SON HAS PONV   Guttate psoriasis 1993   IgA deficiency (HCC) 2019   Neck pain    PMS (premenstrual syndrome)    PONV (postoperative nausea and vomiting)    Poor sleep pattern    Psoriasis    GUTATE, MOSTLY ON LIMBS   Recurrent upper respiratory infection (URI)    Seasonal allergies    Shoulder pain    Sinus mucosal thickening    Tinnitus    BOTH EARS   Trigeminal neuralgia 03/11/2017    SURGICAL HISTORY: Past Surgical History:  Procedure Laterality Date   CHOLECYSTECTOMY N/A 02/03/2015   Procedure: LAPAROSCOPIC CHOLECYSTECTOMY WITH INTRAOPERATIVE CHOLANGIOGRAM;  Surgeon: Darnell Level, MD;  Location: [facility];  Service: General;  Laterality: N/A;   UPPER GASTROINTESTINAL ENDOSCOPY  2019   MS   WISDOM TOOTH EXTRACTION      SOCIAL HISTORY: Social History   Socioeconomic History   Marital status: Married    Spouse name: Not on file   Number of children: 2   Years of education: BA   Highest education level: Not on file  Occupational History   Not on file  Tobacco Use   Smoking status: Never   Smokeless tobacco: Never  Vaping Use   Vaping Use: Never used  Substance and Sexual Activity   Alcohol use: Yes    Alcohol/week: 2.0 - 4.0 standard drinks of  alcohol    Types: 2 - 4 Standard drinks or equivalent per week    Comment: OCCASIONAL   Drug use: No   Sexual activity: Yes    Partners: Male    Birth control/protection: Surgical    Comment: husband vasectomy   Other Topics Concern   Not on file  Social History Narrative   Fun: Biking, hiking and gardening.   Denies abuse and feels safe at home.    Drinks 1 cup of coffee and green tea a day       Right Handed and Left Handed    Lives in a two story home    Social Determinants of Health   Financial Resource Strain: Not on file  Food Insecurity: Not on file  Transportation Needs: Not on file  Physical Activity: Not on file  Stress: Not on file  Social Connections: Not on file  Intimate Partner Violence: Not on file    FAMILY HISTORY: Family History  Problem Relation Age of Onset   Thyroid disease Mother    Hepatitis C Mother        contracted from blood transfusion  Skin cancer Mother    Obesity Father    CAD Father        3 stents   Depression Brother    Thyroid disease Maternal Aunt    Breast cancer Maternal Aunt    Thyroid disease Maternal Aunt    Other Paternal Aunt        joint problems   Heart disease Paternal Grandmother    Diabetes Paternal Grandmother    Heart disease Paternal Grandfather    Diabetes Paternal Grandfather    Colon cancer Neg Hx    Colon polyps Neg Hx    Esophageal cancer Neg Hx    Rectal cancer Neg Hx    Stomach cancer Neg Hx     ALLERGIES:  is allergic to other.  MEDICATIONS:  Current Outpatient Medications  Medication Sig Dispense Refill   albuterol (PROAIR HFA) 108 (90 Base) MCG/ACT inhaler Inhale 2 puffs every 6 hours IF NEEDED for wheeze/ shortness of breath 18 g 12   amphetamine-dextroamphetamine (ADDERALL) 10 MG tablet 1 or 2 daily as needed for sleepiness 60 tablet 0   Budesonide (PULMICORT FLEXHALER) 90 MCG/ACT inhaler Inhale 1 puff into the lungs 2 (two) times daily. 1 each 11   cetirizine (ZYRTEC) 10 MG tablet Take 1  tablet by mouth daily.     cyclobenzaprine (FLEXERIL) 10 MG tablet Take 1 tablet (10 mg total) by mouth at bedtime. 30 tablet 5   DULoxetine (CYMBALTA) 60 MG capsule duloxetine 60 mg capsule,delayed release  TAKE 1 CAPSULE BY MOUTH TWICE A DAY     Ferrous Sulfate (IRON) 325 (65 Fe) MG TABS Take 1 tablet (325 mg total) by mouth 2 (two) times daily with a meal. (Patient taking differently: Take 325 mg by mouth daily.) 30 each 0   methylPREDNISolone (MEDROL DOSEPAK) 4 MG TBPK tablet Take package as directed. 21 each 0   OTEZLA 30 MG TABS Take 1 tablet by mouth 2 (two) times daily.     traZODone (DESYREL) 150 MG tablet Take 150 mg by mouth at bedtime.     No current facility-administered medications for this visit.     PHYSICAL EXAMINATION: ECOG PERFORMANCE STATUS: 0 - Asymptomatic  Vitals:   01/23/22 0933  BP: 121/80  Pulse: (!) 117  Resp: 15  Temp: 97.8 F (36.6 C)  SpO2: 97%   Filed Weights   01/23/22 0933  Weight: 145 lb (65.8 kg)    GENERAL:alert, no distress and comfortable SKIN: skin color, texture, turgor are normal, no rashes or significant lesions EYES: normal, conjunctiva are pink and non-injected, sclera clear OROPHARYNX:no exudate, no erythema and lips, buccal mucosa, and tongue normal  NECK: supple, thyroid normal size, non-tender, without nodularity LYMPH:  no palpable lymphadenopathy in the cervical, axillary  LUNGS: clear to auscultation and percussion with normal breathing effort HEART: regular rate & rhythm and no murmurs and no lower extremity edema ABDOMEN:abdomen soft, non-tender and normal bowel sounds Musculoskeletal:no cyanosis of digits and no clubbing  PSYCH: alert & oriented x 3 with fluent speech NEURO: no focal motor/sensory deficits  LABORATORY DATA:  I have reviewed the data as listed Lab Results  Component Value Date   WBC 5.3 06/11/2020   HGB 12.0 06/11/2020   HCT 37.4 06/11/2020   MCV 84.7 06/11/2020   PLT 349.0 06/11/2020      Chemistry      Component Value Date/Time   NA 136 06/11/2020 1031   K 4.4 06/11/2020 1031   CL 101 06/11/2020 1031  CO2 30 06/11/2020 1031   BUN 10 06/11/2020 1031   CREATININE 0.77 06/11/2020 1031   CREATININE 0.83 04/20/2019 0753      Component Value Date/Time   CALCIUM 9.1 06/11/2020 1031   ALKPHOS 52 06/11/2020 1031   AST 15 06/11/2020 1031   AST 14 (L) 04/20/2019 0753   ALT 10 06/11/2020 1031   ALT 9 04/20/2019 0753   BILITOT 0.4 06/11/2020 1031   BILITOT 0.2 (L) 04/20/2019 0753     CBC showed hemoglobin of 11.1, MCV of 74, normal white blood cell count and platelet count Ferritin of 3 and TIBC of 447, iron saturation of 6%  RADIOGRAPHIC STUDIES: I have personally reviewed the radiological images as listed and agreed with the findings in the report. No results found.  All questions were answered. The patient knows to call the clinic with any problems, questions or concerns. I spent 45 minutes in the care of this patient including H and P, review of records, counseling and coordination of care.     Benay Pike, MD 01/23/2022 9:52 AM

## 2022-01-26 ENCOUNTER — Telehealth: Payer: Self-pay

## 2022-01-26 ENCOUNTER — Other Ambulatory Visit: Payer: Self-pay | Admitting: Hematology and Oncology

## 2022-01-26 ENCOUNTER — Telehealth: Payer: Self-pay | Admitting: Hematology and Oncology

## 2022-01-26 NOTE — Telephone Encounter (Signed)
Contacted patient to scheduled appointments. Patient is aware of appointments that are scheduled.   

## 2022-01-26 NOTE — Telephone Encounter (Signed)
-----  Message from Benay Pike, MD sent at 01/26/2022 11:55 AM EST ----- Regarding: RE: non preferred rx I changed the plan to venofer.   Chelsea, can you change the number and duration of infusion appointments,  Shariece Viveiros, please let the pt know that she needs venofer which is 5 infusions based on insurance preference. ----- Message ----- From: Gaspar Bidding Sent: 01/26/2022   9:14 AM EST To: Merril Abbe, LPN; Quay Burow; # Subject: RE: non preferred rx                           Hey Dr. Chryl Heck,  Pt's plan requires venofer or ferrlecit as the preferred brand. Would you like to update the care plan to one of the preferred brand? If we proceed with the non-preferred brand payor may deny or p2p may be required.   Best, Darlena ----- Message ----- From: Quay Burow Sent: 01/26/2022   8:12 AM EST To: Merril Abbe, LPN; Gaspar Bidding; # Subject: FW: non preferred rx                           Good Morning, Following up on the request below, pt is scheduled for Friday 01/29/22 Please advise  Thank you  Kathyrn Lass  ----- Message ----- From: Quay Burow Sent: 01/25/2022   8:20 AM EST To: Merril Abbe, LPN; [facility] Subject: non preferred rx                               Happy Monday   Referral was placed for fereheme, which is non preferred rx for [employer]. Would you like to update/change rx to preferred rx venofer or ferrlecit?  Beth Rivera 11/16/1970  Thank you, Vilinda Blanks

## 2022-01-26 NOTE — Telephone Encounter (Signed)
LVM for patient regarding updates on iron infusion treatment schedule. Patient informed that her insurance will cover Venofer infusions which will be given in five doses. Patient informed that someone from our scheduling team will be reaching out to get her scheduled. Callback number provided should patient have any additional questions or concerns.

## 2022-01-26 NOTE — Progress Notes (Signed)
Venofer plan placed because of auth issues Message forwarded to scheduling to have this fixed.  Beth Rivera

## 2022-01-29 ENCOUNTER — Other Ambulatory Visit: Payer: Self-pay

## 2022-01-29 ENCOUNTER — Inpatient Hospital Stay: Payer: No Typology Code available for payment source

## 2022-01-29 VITALS — BP 122/83 | HR 78 | Temp 98.4°F | Resp 16

## 2022-01-29 DIAGNOSIS — D509 Iron deficiency anemia, unspecified: Secondary | ICD-10-CM

## 2022-01-29 MED ORDER — SODIUM CHLORIDE 0.9 % IV SOLN
200.0000 mg | Freq: Once | INTRAVENOUS | Status: AC
  Administered 2022-01-29: 200 mg via INTRAVENOUS
  Filled 2022-01-29: qty 200

## 2022-01-29 MED ORDER — SODIUM CHLORIDE 0.9 % IV SOLN: Freq: Once | INTRAVENOUS | Status: AC

## 2022-01-29 NOTE — Patient Instructions (Signed)
Iron Sucrose Injection What is this medication? IRON SUCROSE (EYE ern SOO krose) treats low levels of iron (iron deficiency anemia) in people with kidney disease. Iron is a mineral that plays an important role in making red blood cells, which carry oxygen from your lungs to the rest of your body. This medicine may be used for other purposes; ask your health care provider or pharmacist if you have questions. COMMON BRAND NAME(S): Venofer What should I tell my care team before I take this medication? They need to know if you have any of these conditions: Anemia not caused by low iron levels Heart disease High levels of iron in the blood Kidney disease Liver disease An unusual or allergic reaction to iron, other medications, foods, dyes, or preservatives Pregnant or trying to get pregnant Breastfeeding How should I use this medication? This medication is for infusion into a vein. It is given in a hospital or clinic setting. Talk to your care team about the use of this medication in children. While this medication may be prescribed for children as young as 2 years for selected conditions, precautions do apply. Overdosage: If you think you have taken too much of this medicine contact a poison control center or emergency room at once. NOTE: This medicine is only for you. Do not share this medicine with others. What if I miss a dose? Keep appointments for follow-up doses. It is important not to miss your dose. Call your care team if you are unable to keep an appointment. What may interact with this medication? Do not take this medication with any of the following: Deferoxamine Dimercaprol Other iron products This medication may also interact with the following: Chloramphenicol Deferasirox This list may not describe all possible interactions. Give your health care provider a list of all the medicines, herbs, non-prescription drugs, or dietary supplements you use. Also tell them if you smoke,  drink alcohol, or use illegal drugs. Some items may interact with your medicine. What should I watch for while using this medication? Visit your care team regularly. Tell your care team if your symptoms do not start to get better or if they get worse. You may need blood work done while you are taking this medication. You may need to follow a special diet. Talk to your care team. Foods that contain iron include: whole grains/cereals, dried fruits, beans, or peas, leafy green vegetables, and organ meats (liver, kidney). What side effects may I notice from receiving this medication? Side effects that you should report to your care team as soon as possible: Allergic reactions--skin rash, itching, hives, swelling of the face, lips, tongue, or throat Low blood pressure--dizziness, feeling faint or lightheaded, blurry vision Shortness of breath Side effects that usually do not require medical attention (report to your care team if they continue or are bothersome): Flushing Headache Joint pain Muscle pain Nausea Pain, redness, or irritation at injection site This list may not describe all possible side effects. Call your doctor for medical advice about side effects. You may report side effects to FDA at 1-800-FDA-1088. Where should I keep my medication? This medication is given in a hospital or clinic and will not be stored at home. NOTE: This sheet is a summary. It may not cover all possible information. If you have questions about this medicine, talk to your doctor, pharmacist, or health care provider.  2023 Elsevier/Gold Standard (2020-04-03 00:00:00)

## 2022-01-29 NOTE — Progress Notes (Signed)
Patient observed for 30 minutes following administration of Venofer infusion. Vital signs retaken and remained stable. Patient showed no signs of distress upon discharge.  

## 2022-02-01 ENCOUNTER — Inpatient Hospital Stay: Payer: No Typology Code available for payment source

## 2022-02-01 VITALS — BP 129/84 | HR 86 | Temp 98.9°F | Resp 18

## 2022-02-01 DIAGNOSIS — D509 Iron deficiency anemia, unspecified: Secondary | ICD-10-CM | POA: Diagnosis not present

## 2022-02-01 MED ORDER — SODIUM CHLORIDE 0.9 % IV SOLN
200.0000 mg | Freq: Once | INTRAVENOUS | Status: AC
  Administered 2022-02-01: 200 mg via INTRAVENOUS
  Filled 2022-02-01: qty 200

## 2022-02-01 MED ORDER — SODIUM CHLORIDE 0.9 % IV SOLN: Freq: Once | INTRAVENOUS | Status: AC

## 2022-02-03 ENCOUNTER — Inpatient Hospital Stay: Payer: No Typology Code available for payment source

## 2022-02-03 ENCOUNTER — Other Ambulatory Visit: Payer: Self-pay

## 2022-02-03 VITALS — BP 123/86 | HR 92 | Temp 97.7°F

## 2022-02-03 DIAGNOSIS — D509 Iron deficiency anemia, unspecified: Secondary | ICD-10-CM

## 2022-02-03 MED ORDER — SODIUM CHLORIDE 0.9 % IV SOLN: Freq: Once | INTRAVENOUS | Status: AC

## 2022-02-03 MED ORDER — SODIUM CHLORIDE 0.9 % IV SOLN
200.0000 mg | Freq: Once | INTRAVENOUS | Status: AC
  Administered 2022-02-03: 200 mg via INTRAVENOUS
  Filled 2022-02-03: qty 200

## 2022-02-03 NOTE — Patient Instructions (Signed)
Iron Sucrose Injection What is this medication? IRON SUCROSE (EYE ern SOO krose) treats low levels of iron (iron deficiency anemia) in people with kidney disease. Iron is a mineral that plays an important role in making red blood cells, which carry oxygen from your lungs to the rest of your body. This medicine may be used for other purposes; ask your health care provider or pharmacist if you have questions. COMMON BRAND NAME(S): Venofer What should I tell my care team before I take this medication? They need to know if you have any of these conditions: Anemia not caused by low iron levels Heart disease High levels of iron in the blood Kidney disease Liver disease An unusual or allergic reaction to iron, other medications, foods, dyes, or preservatives Pregnant or trying to get pregnant Breastfeeding How should I use this medication? This medication is for infusion into a vein. It is given in a hospital or clinic setting. Talk to your care team about the use of this medication in children. While this medication may be prescribed for children as young as 2 years for selected conditions, precautions do apply. Overdosage: If you think you have taken too much of this medicine contact a poison control center or emergency room at once. NOTE: This medicine is only for you. Do not share this medicine with others. What if I miss a dose? Keep appointments for follow-up doses. It is important not to miss your dose. Call your care team if you are unable to keep an appointment. What may interact with this medication? Do not take this medication with any of the following: Deferoxamine Dimercaprol Other iron products This medication may also interact with the following: Chloramphenicol Deferasirox This list may not describe all possible interactions. Give your health care provider a list of all the medicines, herbs, non-prescription drugs, or dietary supplements you use. Also tell them if you smoke,  drink alcohol, or use illegal drugs. Some items may interact with your medicine. What should I watch for while using this medication? Visit your care team regularly. Tell your care team if your symptoms do not start to get better or if they get worse. You may need blood work done while you are taking this medication. You may need to follow a special diet. Talk to your care team. Foods that contain iron include: whole grains/cereals, dried fruits, beans, or peas, leafy green vegetables, and organ meats (liver, kidney). What side effects may I notice from receiving this medication? Side effects that you should report to your care team as soon as possible: Allergic reactions--skin rash, itching, hives, swelling of the face, lips, tongue, or throat Low blood pressure--dizziness, feeling faint or lightheaded, blurry vision Shortness of breath Side effects that usually do not require medical attention (report to your care team if they continue or are bothersome): Flushing Headache Joint pain Muscle pain Nausea Pain, redness, or irritation at injection site This list may not describe all possible side effects. Call your doctor for medical advice about side effects. You may report side effects to FDA at 1-800-FDA-1088. Where should I keep my medication? This medication is given in a hospital or clinic and will not be stored at home. NOTE: This sheet is a summary. It may not cover all possible information. If you have questions about this medicine, talk to your doctor, pharmacist, or health care provider.  2023 Elsevier/Gold Standard (2020-04-03 00:00:00)

## 2022-02-05 ENCOUNTER — Inpatient Hospital Stay: Payer: No Typology Code available for payment source | Attending: Hematology and Oncology

## 2022-02-05 ENCOUNTER — Other Ambulatory Visit: Payer: Self-pay

## 2022-02-05 VITALS — BP 138/88 | HR 83 | Resp 16

## 2022-02-05 DIAGNOSIS — D509 Iron deficiency anemia, unspecified: Secondary | ICD-10-CM | POA: Insufficient documentation

## 2022-02-05 MED ORDER — SODIUM CHLORIDE 0.9 % IV SOLN
200.0000 mg | Freq: Once | INTRAVENOUS | Status: AC
  Administered 2022-02-05: 200 mg via INTRAVENOUS
  Filled 2022-02-05: qty 200

## 2022-02-05 MED ORDER — SODIUM CHLORIDE 0.9 % IV SOLN: Freq: Once | INTRAVENOUS | Status: AC

## 2022-02-05 NOTE — Patient Instructions (Signed)

## 2022-02-08 ENCOUNTER — Other Ambulatory Visit: Payer: Self-pay

## 2022-02-08 ENCOUNTER — Inpatient Hospital Stay: Payer: No Typology Code available for payment source

## 2022-02-08 VITALS — BP 120/80 | HR 62 | Resp 16

## 2022-02-08 DIAGNOSIS — D509 Iron deficiency anemia, unspecified: Secondary | ICD-10-CM

## 2022-02-08 MED ORDER — SODIUM CHLORIDE 0.9 % IV SOLN: Freq: Once | INTRAVENOUS | Status: AC

## 2022-02-08 MED ORDER — SODIUM CHLORIDE 0.9 % IV SOLN
200.0000 mg | Freq: Once | INTRAVENOUS | Status: AC
  Administered 2022-02-08: 200 mg via INTRAVENOUS
  Filled 2022-02-08: qty 200

## 2022-02-08 NOTE — Progress Notes (Signed)
Pt declined to stay for 30 minutes post iron infusion. Stable upon discharge. Ambulatory to lobby.

## 2022-03-01 ENCOUNTER — Encounter: Payer: Self-pay | Admitting: Family Medicine

## 2022-03-11 ENCOUNTER — Other Ambulatory Visit: Payer: Self-pay | Admitting: Neurology

## 2022-03-11 DIAGNOSIS — G500 Trigeminal neuralgia: Secondary | ICD-10-CM

## 2022-03-12 NOTE — Progress Notes (Signed)
Follow-up Visit   Date: 03/15/2022    Beth Rivera MRN: [id number] DOB: [birth year]    Beth Rivera is a 52 y.o. Caucasian female returning to the clinic for follow-up of left trigeminal neuralgia.  The patient was accompanied to the clinic by self.   IMPRESSION/PLAN: Left trigeminal neuralgia, stable - Continue Cymbalta 60mg  twice daily - Consider baclofen or oxcarbamazepine going forward  2.  Cervicalgia, improved with PT - Continue flexeril 10mg  qhs prn  3.  Right 3rd and 4th finger numbness - resolved.   - NCS/EMG of the right arm was normal   Return to clinic in 6 month  --------------------------------------------- History of present illness: She was diagnosed with left trigeminal neuralgia in 2019. MRI brain was negative for trigeminal nerve compression. She has tried carbamazepine, gabapentin, and Cymbalta. She has been taking Cymbalta 60mg  BID since January 2022 which has helped, but she continues to have daily episodic left facial pain lasting about 20 seconds.  She also complains of generalized pain, numbness, tingling involving all the limbs, worse on the right side. No weakness. MRI cervical spine from 2021 shows degenerative spondylosis with canal stenosis at C3-4 through C5-6, along with biforaminal narrowing at C4-5, right C5-6, and bilateral C6-7.  She has not done physical therapy.   She does not work.  Nonsmoker. Drink alcohol several times per week.   UPDATE 09/14/2021:  She continues to have episodic pain flares with left trigeminal neuralgia, which is overall stable.  Today, she complains of new numbness of the 3rd and 4th fingers which has been constant since August 1st.  She noticed the numbness during a 3-hr road trip.  She denies weakness of the hand.  She has chronic neck pain, no shooting pain down the arms.  UPDATE 03/15/2022:  She is here for follow-up.   She is doing well and reports that facial pain has improved.  Pain typically occurs  about twice per week.  She remains on Cymbalta 60mg  BID and tolerating it well.  Her neck pain and right hand tingling has significantly improved with PT, dry needling, and flexeril 10mg .  She takes flexeril 10mg  qhs about 2-3 times per week as needed.  No new complaints.   Medications:  Current Outpatient Medications on File Prior to Visit  Medication Sig Dispense Refill   amphetamine-dextroamphetamine (ADDERALL) 10 MG tablet 1 or 2 daily as needed for sleepiness 60 tablet 0   Budesonide (PULMICORT FLEXHALER) 90 MCG/ACT inhaler Inhale 1 puff into the lungs 2 (two) times daily. 1 each 11   cetirizine (ZYRTEC) 10 MG tablet Take 1 tablet by mouth daily.     cyclobenzaprine (FLEXERIL) 10 MG tablet Take 1 tablet (10 mg total) by mouth at bedtime. 30 tablet 5   DULoxetine (CYMBALTA) 60 MG capsule duloxetine 60 mg capsule,delayed release  TAKE 1 CAPSULE BY MOUTH TWICE A DAY     Ferrous Sulfate (IRON) 325 (65 Fe) MG TABS Take 1 tablet (325 mg total) by mouth 2 (two) times daily with a meal. (Patient taking differently: Take 325 mg by mouth daily.) 30 each 0   OTEZLA 30 MG TABS Take 1 tablet by mouth 2 (two) times daily.     traZODone (DESYREL) 150 MG tablet Take 150 mg by mouth at bedtime.     No current facility-administered medications on file prior to visit.    Allergies:  Allergies  Allergen Reactions   Other     Seasonal, dogs    Vital Signs:  BP  134/85   Pulse 82   Ht 5\' 7"  (1.702 m)   Wt 146 lb (66.2 kg)   SpO2 99%   BMI 22.87 kg/m   Neurological Exam: MENTAL STATUS including orientation to time, place, person, recent and remote memory, attention span and concentration, language, and fund of knowledge is normal.  Speech is not dysarthric.  CRANIAL NERVES:   Pupils equal round and reactive to light.  Normal conjugate, extra-ocular eye movements in all directions of gaze.  No ptosis.  Facial sensation is intact. Face is symmetric. Palate elevates symmetrically.    MOTOR:  Motor  strength is 5/5 in all extremities.  No atrophy, fasciculations or abnormal movements.  No pronator drift.  Tone is normal.    COORDINATION/GAIT:  Normal finger-to- nose-finger.  IGait narrow based and stable.   Data: NCS/EMG of the left arm and leg 01/13/2021:  normal  MRI brain wwo contrast 07/25/2017: 1. No acute intracranial abnormality. Please note that this exam does not evaluate the cranial nerves in detail.  2. Circumferential mucosal thickening at the left maxillary sinus. Correlate for sinus disease.  MRI brain wwo contrast 07/25/2017: Normal pre- and postcontrast MRI of the brain. No abnormality to correspond with left sided trigeminal neuralgia.   There is symmetric abnormal signal intensity hyperintense on T2 in the premaxillary soft tissues, above the upper lip and above the nasal bridge most consistent with prior cosmetic injections. Correlate clinically.  MRI cervical spine 08/31/2019: 1. Degenerative spondylosis from C3-4 through C6-7. Canal narrowing with AP diameter of the canal 8.8 mm at the C3-4 level, 7.6 mm at  C4-5, 7.4 mm at C5-6 and 7.8 mm at C6-7. Mild cord deformity on the right at the C5-6 level. No abnormal cord signal.  2. Foraminal narrowing that could cause neural compression bilaterally at C4-5, on the right at C5-6 and bilateral at C6-7.  3. Mild discogenic endplate marrow changes at C4-5 and C5-6 that could relate to neck pain.   NCS/EMG of the right arm 09/29/2021:  Normal   Thank you for allowing me to participate in patient's care.  If I can answer any additional questions, I would be pleased to do so.    Sincerely,    Thurston Brendlinger K. Allena Katz, DO

## 2022-03-15 ENCOUNTER — Ambulatory Visit: Payer: No Typology Code available for payment source | Admitting: Family Medicine

## 2022-03-15 ENCOUNTER — Ambulatory Visit: Payer: No Typology Code available for payment source | Admitting: Neurology

## 2022-03-15 ENCOUNTER — Encounter: Payer: Self-pay | Admitting: Family Medicine

## 2022-03-15 ENCOUNTER — Encounter: Payer: Self-pay | Admitting: Neurology

## 2022-03-15 VITALS — BP 134/85 | HR 82 | Ht 67.0 in | Wt 146.0 lb

## 2022-03-15 VITALS — BP 92/70 | HR 93 | Temp 98.7°F | Ht 67.0 in | Wt 145.7 lb

## 2022-03-15 DIAGNOSIS — Z1231 Encounter for screening mammogram for malignant neoplasm of breast: Secondary | ICD-10-CM

## 2022-03-15 DIAGNOSIS — N959 Unspecified menopausal and perimenopausal disorder: Secondary | ICD-10-CM

## 2022-03-15 DIAGNOSIS — G500 Trigeminal neuralgia: Secondary | ICD-10-CM | POA: Diagnosis not present

## 2022-03-15 DIAGNOSIS — M542 Cervicalgia: Secondary | ICD-10-CM

## 2022-03-15 DIAGNOSIS — Z23 Encounter for immunization: Secondary | ICD-10-CM | POA: Diagnosis not present

## 2022-03-15 DIAGNOSIS — J4489 Other specified chronic obstructive pulmonary disease: Secondary | ICD-10-CM | POA: Diagnosis not present

## 2022-03-15 MED ORDER — NORETHINDRONE ACET-ETHINYL EST 1-20 MG-MCG PO TABS: 1.0000 | ORAL_TABLET | Freq: Every day | ORAL | 11 refills | Status: DC

## 2022-03-15 MED ORDER — PULMICORT FLEXHALER 90 MCG/ACT IN AEPB: 2.0000 | INHALATION_SPRAY | Freq: Two times a day (BID) | RESPIRATORY_TRACT | 11 refills | Status: DC

## 2022-03-15 NOTE — Assessment & Plan Note (Addendum)
Will start lo estrin 1/20 once daily to help with period irregularity and her menopausal flushing. She is due for her mammogram as well and this was ordered. I will follow up with her in about 6 months for her annual physical.

## 2022-03-15 NOTE — Patient Instructions (Signed)
Return to clinic in 9 month

## 2022-03-15 NOTE — Progress Notes (Signed)
men

## 2022-03-15 NOTE — Patient Instructions (Signed)
1200 mg of calcium daily  1000 units of vitamin D daily

## 2022-03-15 NOTE — Progress Notes (Signed)
Acute Office Visit  Subjective:     Patient ID: Beth Rivera, female    DOB: 09-Oct-1970, 52 y.o.   MRN: [id number]  Chief Complaint  Patient presents with   Medical Management of Chronic Issues   Menstrual Problem    Patient states the LMP was on 02/11/2022-lasted until 03/04/2022    HPI Patient is in today for excessive bleeding with her last period. States that her periods have been very irregular, sometimes going 3 months without and then in February she had a period that lasted 3 weeks. Also having night sweats. Some brain fog, low libido, etc. We discussed using OCP's to help regulate her periods and we discussed the risks/benefits of the medication.   Current Outpatient Medications  Medication Instructions   amphetamine-dextroamphetamine (ADDERALL) 10 MG tablet 1 or 2 daily as needed for sleepiness   Budesonide (PULMICORT FLEXHALER) 90 MCG/ACT inhaler 2 puffs, Inhalation, 2 times daily   cetirizine (ZYRTEC) 10 MG tablet 1 tablet, Oral, Daily   cyclobenzaprine (FLEXERIL) 10 mg, Oral, Daily at bedtime   DULoxetine (CYMBALTA) 60 MG capsule duloxetine 60 mg capsule,delayed release  TAKE 1 CAPSULE BY MOUTH TWICE A DAY   Iron 325 mg, Oral, 2 times daily with meals   norethindrone-ethinyl estradiol (LOESTRIN 1/20, 21,) 1-20 MG-MCG tablet 1 tablet, Oral, Daily   OTEZLA 30 MG TABS 1 tablet, Oral, 2 times daily   traZODone (DESYREL) 150 mg, Oral, Daily at bedtime    Review of Systems  All other systems reviewed and are negative.       Objective:    BP 92/70 (BP Location: Left Arm, Patient Position: Sitting, Cuff Size: Normal)   Pulse 93   Temp 98.7 F (37.1 C) (Oral)   Ht '5\' 7"'$  (1.702 m)   Wt 145 lb 11.2 oz (66.1 kg)   LMP 02/11/2022   SpO2 99%   BMI 22.82 kg/m    Physical Exam Vitals reviewed.  Constitutional:      Appearance: Normal appearance. She is well-groomed and normal weight.  Eyes:     Conjunctiva/sclera: Conjunctivae normal.  Cardiovascular:     Rate  and Rhythm: Normal rate and regular rhythm.     Heart sounds: S1 normal and S2 normal.  Pulmonary:     Effort: Pulmonary effort is normal.     Breath sounds: Normal breath sounds and air entry.  Musculoskeletal:     Right lower leg: No edema.     Left lower leg: No edema.  Neurological:     Mental Status: She is alert and oriented to person, place, and time. Mental status is at baseline.     Gait: Gait is intact.  Psychiatric:        Mood and Affect: Mood and affect normal.        Speech: Speech normal.        Behavior: Behavior normal.        Judgment: Judgment normal.     No results found for any visits on 03/15/22.      Assessment & Plan:   Problem List Items Addressed This Visit       Unprioritized   Asthmatic bronchitis , chronic   Relevant Medications   Budesonide (PULMICORT FLEXHALER) 90 MCG/ACT inhaler   Perimenopausal disorder - Primary    Will start lo estrin 1/20 once daily to help with period irregularity and her menopausal flushing. She is due for her mammogram as well and this was ordered. I will follow up with her  in about 6 months for her annual physical.      Relevant Medications   norethindrone-ethinyl estradiol (LOESTRIN 1/20, 21,) 1-20 MG-MCG tablet   Other Visit Diagnoses     Breast cancer screening by mammogram       Relevant Orders   MM Digital Screening   Immunization due       Relevant Orders   Zoster Recombinant (Shingrix )       Meds ordered this encounter  Medications   norethindrone-ethinyl estradiol (LOESTRIN 1/20, 21,) 1-20 MG-MCG tablet    Sig: Take 1 tablet by mouth daily.    Dispense:  28 tablet    Refill:  11   Budesonide (PULMICORT FLEXHALER) 90 MCG/ACT inhaler    Sig: Inhale 2 puffs into the lungs 2 (two) times daily.    Dispense:  2 each    Refill:  11    Increasing dosage to 2 puff twice a day    Return in about 6 months (around 09/15/2022) for annual physical exam.  Farrel Conners, MD

## 2022-03-19 ENCOUNTER — Ambulatory Visit
Admission: RE | Admit: 2022-03-19 | Discharge: 2022-03-19 | Disposition: A | Discharge status: Home / Self Care | Payer: No Typology Code available for payment source | Source: Ambulatory Visit | Attending: Family Medicine | Admitting: Family Medicine

## 2022-03-19 DIAGNOSIS — Z1231 Encounter for screening mammogram for malignant neoplasm of breast: Secondary | ICD-10-CM

## 2022-03-24 ENCOUNTER — Encounter: Payer: Self-pay | Admitting: Family Medicine

## 2022-03-24 ENCOUNTER — Other Ambulatory Visit: Payer: Self-pay | Admitting: Family Medicine

## 2022-03-24 DIAGNOSIS — R928 Other abnormal and inconclusive findings on diagnostic imaging of breast: Secondary | ICD-10-CM

## 2022-03-25 ENCOUNTER — Other Ambulatory Visit: Payer: Self-pay | Admitting: Family Medicine

## 2022-03-25 ENCOUNTER — Ambulatory Visit
Admission: RE | Admit: 2022-03-25 | Discharge: 2022-03-25 | Disposition: A | Discharge status: Home / Self Care | Payer: No Typology Code available for payment source | Source: Ambulatory Visit | Attending: Family Medicine | Admitting: Family Medicine

## 2022-03-25 DIAGNOSIS — R928 Other abnormal and inconclusive findings on diagnostic imaging of breast: Secondary | ICD-10-CM

## 2022-03-25 DIAGNOSIS — R921 Mammographic calcification found on diagnostic imaging of breast: Secondary | ICD-10-CM

## 2022-04-02 ENCOUNTER — Ambulatory Visit
Admission: RE | Admit: 2022-04-02 | Discharge: 2022-04-02 | Disposition: A | Discharge status: Home / Self Care | Payer: No Typology Code available for payment source | Source: Ambulatory Visit | Attending: Family Medicine | Admitting: Family Medicine

## 2022-04-02 ENCOUNTER — Ambulatory Visit
Admission: RE | Admit: 2022-04-02 | Discharge: 2022-04-02 | Disposition: A | Discharge status: Home / Self Care | Payer: No Typology Code available for payment source | Source: Ambulatory Visit | Attending: Family Medicine

## 2022-04-02 DIAGNOSIS — R921 Mammographic calcification found on diagnostic imaging of breast: Secondary | ICD-10-CM

## 2022-04-02 DIAGNOSIS — R928 Other abnormal and inconclusive findings on diagnostic imaging of breast: Secondary | ICD-10-CM

## 2022-04-02 HISTORY — PX: BREAST BIOPSY: SHX20

## 2022-04-20 ENCOUNTER — Other Ambulatory Visit: Payer: Self-pay | Admitting: Neurology

## 2022-04-20 DIAGNOSIS — G500 Trigeminal neuralgia: Secondary | ICD-10-CM

## 2022-05-12 ENCOUNTER — Emergency Department (HOSPITAL_COMMUNITY)
Admission: EM | Admit: 2022-05-12 | Discharge: 2022-05-12 | Disposition: A | Discharge status: Home / Self Care | Payer: No Typology Code available for payment source | Attending: Emergency Medicine | Admitting: Emergency Medicine

## 2022-05-12 ENCOUNTER — Encounter (HOSPITAL_COMMUNITY): Payer: Self-pay

## 2022-05-12 DIAGNOSIS — W260XXA Contact with knife, initial encounter: Secondary | ICD-10-CM | POA: Diagnosis not present

## 2022-05-12 DIAGNOSIS — Y9389 Activity, other specified: Secondary | ICD-10-CM | POA: Insufficient documentation

## 2022-05-12 DIAGNOSIS — S61210A Laceration without foreign body of right index finger without damage to nail, initial encounter: Secondary | ICD-10-CM | POA: Diagnosis not present

## 2022-05-12 DIAGNOSIS — Z23 Encounter for immunization: Secondary | ICD-10-CM | POA: Diagnosis not present

## 2022-05-12 MED ORDER — LIDOCAINE HCL 1 % IJ SOLN
INTRAMUSCULAR | Status: AC
  Filled 2022-05-12: qty 20

## 2022-05-12 MED ORDER — TETANUS-DIPHTH-ACELL PERTUSSIS 5-2.5-18.5 LF-MCG/0.5 IM SUSY
0.5000 mL | PREFILLED_SYRINGE | Freq: Once | INTRAMUSCULAR | Status: AC
  Administered 2022-05-12: 0.5 mL via INTRAMUSCULAR
  Filled 2022-05-12: qty 0.5

## 2022-05-12 NOTE — Discharge Instructions (Signed)
Sutures out in 7 days

## 2022-05-12 NOTE — ED Triage Notes (Signed)
Pt presents with c/o right index finger laceration. Pt reports that she cut her finger with a knife this morning, bleeding controlled at this time.

## 2022-05-12 NOTE — ED Provider Notes (Signed)
[facility] AT [facility] Provider Note   CSN: [id number] Arrival date & time: 05/12/22  0846     History  Chief Complaint  Patient presents with   Extremity Laceration    Beth Rivera is a 52 y.o. female.  52 year old female presents with laceration to right index finger which occurred just prior to arrival.  States she was cutting watermelon she lacerated the dorsal surface of her right index finger.  Denies any numbness or tingling to her distal right finger.  Her tetanus status is not current       Home Medications Prior to Admission medications   Medication Sig Start Date End Date Taking? Authorizing Provider  amphetamine-dextroamphetamine (ADDERALL) 10 MG tablet 1 or 2 daily as needed for sleepiness 10/22/19   Young, Joni Fears D, MD  Budesonide (PULMICORT FLEXHALER) 90 MCG/ACT inhaler Inhale 2 puffs into the lungs 2 (two) times daily. 03/15/22   Karie Georges, MD  cetirizine (ZYRTEC) 10 MG tablet Take 1 tablet by mouth daily.    [provider]  cyclobenzaprine (FLEXERIL) 10 MG tablet TAKE 1 TABLET BY MOUTH EVERYDAY AT BEDTIME 04/20/22   Patel, Donika K, DO  DULoxetine (CYMBALTA) 60 MG capsule duloxetine 60 mg capsule,delayed release  TAKE 1 CAPSULE BY MOUTH TWICE A DAY 03/06/20   [provider]  Ferrous Sulfate (IRON) 325 (65 Fe) MG TABS Take 1 tablet (325 mg total) by mouth 2 (two) times daily with a meal. Patient taking differently: Take 325 mg by mouth daily. 03/02/17   Meryl Dare, MD  norethindrone-ethinyl estradiol (LOESTRIN 1/20, 21,) 1-20 MG-MCG tablet Take 1 tablet by mouth daily. 03/15/22   Karie Georges, MD  OTEZLA 30 MG TABS Take 1 tablet by mouth 2 (two) times daily. 01/06/22   [provider]  traZODone (DESYREL) 150 MG tablet Take 150 mg by mouth at bedtime. 03/06/20   [provider]      Allergies    Other    Review of Systems   Review of Systems  All other systems reviewed  and are negative.   Physical Exam Updated Vital Signs BP (!) 136/92 (BP Location: Right Arm)   Pulse 96   Temp 98.1 F (36.7 C) (Oral)   Resp 17   LMP 05/11/2022 (Approximate)   SpO2 96%  Physical Exam Vitals and nursing note reviewed.  Constitutional:      General: She is not in acute distress.    Appearance: Normal appearance. She is well-developed. She is not toxic-appearing.  HENT:     Head: Normocephalic and atraumatic.  Eyes:     General: Lids are normal.     Conjunctiva/sclera: Conjunctivae normal.     Pupils: Pupils are equal, round, and reactive to light.  Neck:     Thyroid: No thyroid mass.     Trachea: No tracheal deviation.  Cardiovascular:     Rate and Rhythm: Normal rate and regular rhythm.     Heart sounds: Normal heart sounds. No murmur heard.    No gallop.  Pulmonary:     Effort: Pulmonary effort is normal. No respiratory distress.     Breath sounds: Normal breath sounds. No stridor. No decreased breath sounds, wheezing, rhonchi or rales.  Abdominal:     General: There is no distension.     Palpations: Abdomen is soft.     Tenderness: There is no abdominal tenderness. There is no rebound.  Musculoskeletal:        General:  No tenderness. Normal range of motion.     Cervical back: Normal range of motion and neck supple.     Comments: 1 centimeter laceration to right index finger.  Neurovascular intact  Skin:    General: Skin is warm and dry.     Findings: No abrasion or rash.  Neurological:     Mental Status: She is alert and oriented to person, place, and time. Mental status is at baseline.     GCS: GCS eye subscore is 4. GCS verbal subscore is 5. GCS motor subscore is 6.     Cranial Nerves: No cranial nerve deficit.     Sensory: No sensory deficit.     Motor: Motor function is intact.  Psychiatric:        Attention and Perception: Attention normal.        Speech: Speech normal.        Behavior: Behavior normal.     ED Results / Procedures /  Treatments   Labs (all labs ordered are listed, but only abnormal results are displayed) Labs Reviewed - No data to display  EKG None  Radiology No results found.  Procedures .Marland KitchenLaceration Repair  Date/Time: 05/12/2022 9:25 AM  Performed by: Lorre Nick, MD Authorized by: Lorre Nick, MD   Consent:    Consent obtained:  Verbal   Consent given by:  Patient   Risks discussed:  Infection Universal protocol:    Immediately prior to procedure, a time out was called: yes   Anesthesia:    Anesthesia method:  Nerve block   Block location:  Right index finger   Block needle gauge:  24 G   Block anesthetic:  Lidocaine 1% w/o epi   Block injection procedure:  Anatomic landmarks identified   Block outcome:  Anesthesia achieved Laceration details:    Location:  Finger   Finger location:  R index finger   Length (cm):  2.6 Pre-procedure details:    Preparation:  Patient was prepped and draped in usual sterile fashion Exploration:    Limited defect created (wound extended): no     Wound exploration: wound explored through full range of motion     Contaminated: no   Treatment:    Area cleansed with:  Povidone-iodine   Amount of cleaning:  Standard   Irrigation solution:  Sterile saline   Visualized foreign bodies/material removed: no     Debridement:  None   Undermining:  None   Scar revision: no   Skin repair:    Repair method:  Sutures   Suture size:  4-0   Suture material:  Prolene   Suture technique:  Simple interrupted   Number of sutures:  4 Approximation:    Approximation:  Close Repair type:    Repair type:  Simple Post-procedure details:    Dressing:  Open (no dressing)   Procedure completion:  Tolerated     Medications Ordered in ED Medications  lidocaine (XYLOCAINE) 1 % (with pres) injection (  Given by Other 05/12/22 5621)    ED Course/ Medical Decision Making/ A&P                             Medical Decision Making  Patient's tetanus status  updated here.  Laceration.  As above.  Return precautions given        Final Clinical Impression(s) / ED Diagnoses Final diagnoses:  None    Rx / DC Orders ED Discharge Orders  None         Lorre Nick, MD 05/12/22 [phone]

## 2022-05-18 ENCOUNTER — Encounter: Payer: Self-pay | Admitting: Adult Health

## 2022-05-18 NOTE — Telephone Encounter (Signed)
Please advise 

## 2022-05-19 ENCOUNTER — Ambulatory Visit: Payer: No Typology Code available for payment source | Admitting: Adult Health

## 2022-05-19 NOTE — Telephone Encounter (Signed)
Pt was already advised via phone call. Pt appt. Was canceled.

## 2022-05-19 NOTE — Telephone Encounter (Signed)
Please let patient know.

## 2022-05-26 ENCOUNTER — Encounter: Payer: Self-pay | Admitting: Adult Health

## 2022-05-26 ENCOUNTER — Ambulatory Visit: Payer: No Typology Code available for payment source | Admitting: Adult Health

## 2022-05-26 VITALS — BP 130/80 | HR 82 | Temp 99.2°F | Ht 67.0 in | Wt 140.0 lb

## 2022-05-26 DIAGNOSIS — S61210D Laceration without foreign body of right index finger without damage to nail, subsequent encounter: Secondary | ICD-10-CM | POA: Diagnosis not present

## 2022-05-26 DIAGNOSIS — Z4802 Encounter for removal of sutures: Secondary | ICD-10-CM | POA: Diagnosis not present

## 2022-05-26 NOTE — Progress Notes (Signed)
Subjective:    Patient ID: Beth Rivera, female    DOB: [birth year], 52 y.o.   MRN: [id number]  HPI  52 year old female who  has a past medical history of Anemia, Anxiety, Asthma, Chronic cholecystitis, Closed fracture of phalanx of left fifth toe with delayed healing (12/14/2017), Complication of anesthesia, Cough (LAST 3 WEEKS), Depression, Eczema, Family history of adverse reaction to anesthesia, Guttate psoriasis (1993), IgA deficiency (HCC) (2019), Neck pain, PMS (premenstrual syndrome), PONV (postoperative nausea and vomiting), Poor sleep pattern, Psoriasis, Recurrent upper respiratory infection (URI), Seasonal allergies, Shoulder pain, Sinus mucosal thickening, Tinnitus, and Trigeminal neuralgia (03/11/2017).  She is being evaluated today for suture removal. She was seen in the ER 14 days ago for laceration to right index finger. She lacerated the dorsal surfect of her right index finer cutting watermelon.   4 Sutures were placed   Tdap updated   Review of Systems See HPI   Past Medical History:  Diagnosis Date   Anemia    currently on iron   Anxiety    on meds   Asthma    with respiratory illnesses-uses inhaler PRN   Chronic cholecystitis    Closed fracture of phalanx of left fifth toe with delayed healing 12/14/2017   Complication of anesthesia    PONV   Cough LAST 3 WEEKS   CLEAR SPUTUM, NO FEVER    Depression    on meds   Eczema    Family history of adverse reaction to anesthesia    SON HAS PONV   Guttate psoriasis 1993   IgA deficiency (HCC) 2019   Neck pain    PMS (premenstrual syndrome)    PONV (postoperative nausea and vomiting)    Poor sleep pattern    Psoriasis    GUTATE, MOSTLY ON LIMBS   Recurrent upper respiratory infection (URI)    Seasonal allergies    Shoulder pain    Sinus mucosal thickening    Tinnitus    BOTH EARS   Trigeminal neuralgia 03/11/2017    Social History   Socioeconomic History   Marital status: Married    Spouse name:  Not on file   Number of children: 2   Years of education: BA   Highest education level: Bachelor's degree (e.g., BA, AB, BS)  Occupational History   Not on file  Tobacco Use   Smoking status: Never   Smokeless tobacco: Never  Vaping Use   Vaping Use: Never used  Substance and Sexual Activity   Alcohol use: Yes    Alcohol/week: 2.0 - 4.0 standard drinks of alcohol    Types: 2 - 4 Standard drinks or equivalent per week    Comment: OCCASIONAL   Drug use: No   Sexual activity: Yes    Partners: Male    Birth control/protection: Surgical    Comment: husband vasectomy   Other Topics Concern   Not on file  Social History Narrative   Fun: Biking, hiking and gardening.   Denies abuse and feels safe at home.    Drinks 1 cup of coffee and green tea a day       Right Handed and Left Handed    Lives in a two story home    Social Determinants of Health   Financial Resource Strain: Low Risk  (03/15/2022)   Overall Financial Resource Strain (CARDIA)    Difficulty of Paying Living Expenses: Not hard at all  Food Insecurity: No Food Insecurity (03/15/2022)   Hunger Vital Sign  Worried About Programme researcher, broadcasting/film/video in the Last Year: Never true    Ran Out of Food in the Last Year: Never true  Transportation Needs: Unmet Transportation Needs (03/15/2022)   PRAPARE - Transportation    Lack of Transportation (Medical): No    Lack of Transportation (Non-Medical): Yes  Physical Activity: Sufficiently Active (03/15/2022)   Exercise Vital Sign    Days of Exercise per Week: 2 days    Minutes of Exercise per Session: 90 min  Stress: No Stress Concern Present (03/15/2022)   [employer] of Occupational Health - Occupational Stress Questionnaire    Feeling of Stress : Not at all  Social Connections: Moderately Isolated (03/15/2022)   Social Connection and Isolation Panel [NHANES]    Frequency of Communication with Friends and Family: Once a week    Frequency of Social Gatherings with Friends and  Family: Twice a week    Attends Religious Services: Never    Database administrator or Organizations: No    Attends Engineer, structural: Not on file    Marital Status: Married  Catering manager Violence: Not on file    Past Surgical History:  Procedure Laterality Date   BREAST BIOPSY Left 04/02/2022   MM LT BREAST BX W LOC DEV 1ST LESION IMAGE BX SPEC STEREO GUIDE 04/02/2022 [facility]   CHOLECYSTECTOMY N/A 02/03/2015   Procedure: LAPAROSCOPIC CHOLECYSTECTOMY WITH INTRAOPERATIVE CHOLANGIOGRAM;  Surgeon: Darnell Level, MD;  Location: [facility];  Service: General;  Laterality: N/A;   UPPER GASTROINTESTINAL ENDOSCOPY  2019   MS   WISDOM TOOTH EXTRACTION      Family History  Problem Relation Age of Onset   Thyroid disease Mother    Hepatitis C Mother        contracted from blood transfusion   Skin cancer Mother    Obesity Father    CAD Father        3 stents   Depression Brother    Thyroid disease Maternal Aunt    Breast cancer Maternal Aunt    Thyroid disease Maternal Aunt    Other Paternal Aunt        joint problems   Heart disease Paternal Grandmother    Diabetes Paternal Grandmother    Heart disease Paternal Grandfather    Diabetes Paternal Grandfather    Colon cancer Neg Hx    Colon polyps Neg Hx    Esophageal cancer Neg Hx    Rectal cancer Neg Hx    Stomach cancer Neg Hx     Allergies  Allergen Reactions   Other     Seasonal, dogs and mold cause mucus and sinus issues    Current Outpatient Medications on File Prior to Visit  Medication Sig Dispense Refill   amphetamine-dextroamphetamine (ADDERALL) 10 MG tablet 1 or 2 daily as needed for sleepiness 60 tablet 0   Budesonide (PULMICORT FLEXHALER) 90 MCG/ACT inhaler Inhale 2 puffs into the lungs 2 (two) times daily. 2 each 11   cetirizine (ZYRTEC) 10 MG tablet Take 1 tablet by mouth daily.     cyclobenzaprine (FLEXERIL) 10 MG tablet TAKE 1 TABLET BY MOUTH EVERYDAY AT BEDTIME 30 tablet 5   DULoxetine  (CYMBALTA) 60 MG capsule duloxetine 60 mg capsule,delayed release  TAKE 1 CAPSULE BY MOUTH TWICE A DAY     Ferrous Sulfate (IRON) 325 (65 Fe) MG TABS Take 1 tablet (325 mg total) by mouth 2 (two) times daily with a meal. (Patient taking differently: Take 325 mg by  mouth daily.) 30 each 0   norethindrone-ethinyl estradiol (LOESTRIN 1/20, 21,) 1-20 MG-MCG tablet Take 1 tablet by mouth daily. 28 tablet 11   OTEZLA 30 MG TABS Take 1 tablet by mouth 2 (two) times daily.     traZODone (DESYREL) 150 MG tablet Take 150 mg by mouth at bedtime.     No current facility-administered medications on file prior to visit.    LMP 05/11/2022 (Approximate)       Objective:   Physical Exam Vitals and nursing note reviewed.  Constitutional:      Appearance: Normal appearance.  Skin:    General: Skin is warm and dry.     Capillary Refill: Capillary refill takes less than 2 seconds.     Comments: Laceration on right ring finger appears well healed. No signs of infection   Neurological:     General: No focal deficit present.     Mental Status: She is alert and oriented to person, place, and time.  Psychiatric:        Mood and Affect: Mood normal.        Behavior: Behavior normal.        Thought Content: Thought content normal.        Judgment: Judgment normal.       Assessment & Plan:  1. Laceration of right index finger without damage to nail, foreign body presence unspecified, subsequent encounter - 4 sutures were removed. Patient tolerated procedure well.  2. Visit for suture removal  Shirline Frees, NP

## 2022-05-27 ENCOUNTER — Encounter: Payer: No Typology Code available for payment source | Admitting: Internal Medicine

## 2022-05-27 NOTE — Telephone Encounter (Signed)
Noted  

## 2022-05-27 NOTE — Telephone Encounter (Signed)
It will take some time for the skin to stretch back out-- maybe a month or so? I'm not exactly sure, she can just gently bend the finger until she meets resistance as tolerated.

## 2022-05-27 NOTE — Telephone Encounter (Signed)
Please advise 

## 2022-07-23 ENCOUNTER — Other Ambulatory Visit: Payer: Self-pay

## 2022-07-23 DIAGNOSIS — D509 Iron deficiency anemia, unspecified: Secondary | ICD-10-CM

## 2022-07-26 ENCOUNTER — Inpatient Hospital Stay: Payer: No Typology Code available for payment source | Admitting: Hematology and Oncology

## 2022-07-26 ENCOUNTER — Inpatient Hospital Stay: Payer: No Typology Code available for payment source | Attending: Hematology and Oncology

## 2022-07-26 ENCOUNTER — Encounter: Payer: Self-pay | Admitting: Hematology and Oncology

## 2022-07-26 ENCOUNTER — Other Ambulatory Visit: Payer: Self-pay

## 2022-07-26 VITALS — BP 110/74 | HR 85 | Temp 97.7°F | Resp 18 | Ht 67.0 in | Wt 141.0 lb

## 2022-07-26 DIAGNOSIS — D509 Iron deficiency anemia, unspecified: Secondary | ICD-10-CM | POA: Diagnosis present

## 2022-07-26 DIAGNOSIS — Z79899 Other long term (current) drug therapy: Secondary | ICD-10-CM | POA: Diagnosis not present

## 2022-07-26 DIAGNOSIS — Z808 Family history of malignant neoplasm of other organs or systems: Secondary | ICD-10-CM | POA: Insufficient documentation

## 2022-07-26 DIAGNOSIS — Z803 Family history of malignant neoplasm of breast: Secondary | ICD-10-CM | POA: Insufficient documentation

## 2022-07-26 LAB — CMP (CANCER CENTER ONLY)
ALT: 12 U/L (ref 0–44)
AST: 17 U/L (ref 15–41)
Albumin: 4 g/dL (ref 3.5–5.0)
Alkaline Phosphatase: 62 U/L (ref 38–126)
Anion gap: 6 (ref 5–15)
BUN: 9 mg/dL (ref 6–20)
CO2: 31 mmol/L (ref 22–32)
Calcium: 9.3 mg/dL (ref 8.9–10.3)
Chloride: 102 mmol/L (ref 98–111)
Creatinine: 0.87 mg/dL (ref 0.44–1.00)
GFR, Estimated: >60 mL/min
Glucose, Bld: 87 mg/dL (ref 70–99)
Potassium: 3.9 mmol/L (ref 3.5–5.1)
Sodium: 139 mmol/L (ref 135–145)
Total Bilirubin: 0.3 mg/dL (ref 0.3–1.2)
Total Protein: 7 g/dL (ref 6.5–8.1)

## 2022-07-26 LAB — FERRITIN: Ferritin: 31 ng/mL (ref 11–307)

## 2022-07-26 LAB — CBC WITH DIFFERENTIAL (CANCER CENTER ONLY)
Abs Immature Granulocytes: 0.01 K/uL (ref 0.00–0.07)
Basophils Absolute: 0 K/uL (ref 0.0–0.1)
Basophils Relative: 0 %
Eosinophils Absolute: 0.2 K/uL (ref 0.0–0.5)
Eosinophils Relative: 4 %
HCT: 41.7 % (ref 36.0–46.0)
Hemoglobin: 13.6 g/dL (ref 12.0–15.0)
Immature Granulocytes: 0 %
Lymphocytes Relative: 48 %
Lymphs Abs: 2.6 K/uL (ref 0.7–4.0)
MCH: 29.2 pg (ref 26.0–34.0)
MCHC: 32.6 g/dL (ref 30.0–36.0)
MCV: 89.5 fL (ref 80.0–100.0)
Monocytes Absolute: 0.4 K/uL (ref 0.1–1.0)
Monocytes Relative: 8 %
Neutro Abs: 2.1 K/uL (ref 1.7–7.7)
Neutrophils Relative %: 40 %
Platelet Count: 279 K/uL (ref 150–400)
RBC: 4.66 MIL/uL (ref 3.87–5.11)
RDW: 13.3 % (ref 11.5–15.5)
WBC Count: 5.4 K/uL (ref 4.0–10.5)
nRBC: 0 % (ref 0.0–0.2)

## 2022-07-26 LAB — IRON AND IRON BINDING CAPACITY (CC-WL,HP ONLY)
Iron: 38 ug/dL (ref 28–170)
Saturation Ratios: 12 % (ref 10.4–31.8)
TIBC: 321 ug/dL (ref 250–450)
UIBC: 283 ug/dL (ref 148–442)

## 2022-07-26 LAB — RETIC PANEL
Immature Retic Fract: 10.1 % (ref 2.3–15.9)
RBC.: 4.65 MIL/uL (ref 3.87–5.11)
Retic Count, Absolute: 53 10*3/uL (ref 19.0–186.0)
Retic Ct Pct: 1.1 % (ref 0.4–3.1)
Reticulocyte Hemoglobin: 31.3 pg (ref >27.9)

## 2022-07-26 NOTE — Progress Notes (Signed)
[facility] CONSULT NOTE  Patient Care Team: Karie Georges, MD as PCP - General (Family Medicine) Aggie Cosier, MD as Referring Physician (Psychiatry) Aris Lot, MD as Consulting Physician (Dermatology) Jaymes Graff, MD as Consulting Physician (Obstetrics and Gynecology) Glendale Chard, DO as Consulting Physician (Neurology)  CHIEF COMPLAINTS/PURPOSE OF CONSULTATION:  IDA  ASSESSMENT & PLAN:   This is a very pleasant 52 year old perimenopausal female patient with chronic iron deficiency, required intravenous iron infusion in the past, is on chronic oral iron supplementation referred back to hematology for consideration of IV iron since she has persistent iron deficiency. He received IV iron, tolerated it remarkably well.  Since her last visit here she has been doing really well.  No more fatigue, pica.  She is playing tennis again, active, has normal menstrual cycles, last cycle about 8 months ago.  No concerns on physical exam.  CBC from today shows normal hemoglobin.  Ferritin levels are pending.  At this time since she is clinically asymptomatic and she has no evidence of anemia, I have encouraged her to follow-up with [place] in about a year or sooner as needed.  She is agreeable to this plan.  HISTORY OF PRESENTING ILLNESS:  Beth Rivera 52 y.o. female is here because of IDA  This is a very pleasant 52 yr old perimenopausal female with chronic IDA, required ferraheme in 2019, Ig A deficiency and trigeminal neuralgia referred to hematology for evaluation of iron deficiency anemia.   Ms. Dhrithi is here for follow-up.  Since her last iron infusion, she feels absolutely unremarkable.  She has good amount of energy, playing tennis again.  Ice craving has disappeared.  She denies any more bleeding, last menstrual cycles were about 8 months ago.  No hematochezia or melena.  No interim hospitalizations or infections.  No new medications.  Rest of the pertinent 10  point ROS reviewed and negative    MEDICAL HISTORY:  Past Medical History:  Diagnosis Date   Anemia    currently on iron   Anxiety    on meds   Asthma    with respiratory illnesses-uses inhaler PRN   Chronic cholecystitis    Closed fracture of phalanx of left fifth toe with delayed healing 12/14/2017   Complication of anesthesia    PONV   Cough LAST 3 WEEKS   CLEAR SPUTUM, NO FEVER    Depression    on meds   Eczema    Family history of adverse reaction to anesthesia    SON HAS PONV   Guttate psoriasis 1993   IgA deficiency (HCC) 2019   Neck pain    PMS (premenstrual syndrome)    PONV (postoperative nausea and vomiting)    Poor sleep pattern    Psoriasis    GUTATE, MOSTLY ON LIMBS   Recurrent upper respiratory infection (URI)    Seasonal allergies    Shoulder pain    Sinus mucosal thickening    Tinnitus    BOTH EARS   Trigeminal neuralgia 03/11/2017    SURGICAL HISTORY: Past Surgical History:  Procedure Laterality Date   BREAST BIOPSY Left 04/02/2022   MM LT BREAST BX W LOC DEV 1ST LESION IMAGE BX SPEC STEREO GUIDE 04/02/2022 [facility]   CHOLECYSTECTOMY N/A 02/03/2015   Procedure: LAPAROSCOPIC CHOLECYSTECTOMY WITH INTRAOPERATIVE CHOLANGIOGRAM;  Surgeon: Darnell Level, MD;  Location: [facility];  Service: General;  Laterality: N/A;   UPPER GASTROINTESTINAL ENDOSCOPY  2019   MS   WISDOM TOOTH EXTRACTION  SOCIAL HISTORY: Social History   Socioeconomic History   Marital status: Married    Spouse name: Not on file   Number of children: 2   Years of education: BA   Highest education level: Bachelor's degree (e.g., BA, AB, BS)  Occupational History   Not on file  Tobacco Use   Smoking status: Never   Smokeless tobacco: Never  Vaping Use   Vaping status: Never Used  Substance and Sexual Activity   Alcohol use: Yes    Alcohol/week: 2.0 - 4.0 standard drinks of alcohol    Types: 2 - 4 Standard drinks or equivalent per week    Comment: OCCASIONAL    Drug use: No   Sexual activity: Yes    Partners: Male    Birth control/protection: Surgical    Comment: husband vasectomy   Other Topics Concern   Not on file  Social History Narrative   Fun: Biking, hiking and gardening.   Denies abuse and feels safe at home.    Drinks 1 cup of coffee and green tea a day       Right Handed and Left Handed    Lives in a two story home    Social Determinants of Health   Financial Resource Strain: Low Risk  (03/15/2022)   Overall Financial Resource Strain (CARDIA)    Difficulty of Paying Living Expenses: Not hard at all  Food Insecurity: No Food Insecurity (03/15/2022)   Hunger Vital Sign    Worried About Running Out of Food in the Last Year: Never true    Ran Out of Food in the Last Year: Never true  Transportation Needs: Unmet Transportation Needs (03/15/2022)   PRAPARE - Transportation    Lack of Transportation (Medical): No    Lack of Transportation (Non-Medical): Yes  Physical Activity: Sufficiently Active (03/15/2022)   Exercise Vital Sign    Days of Exercise per Week: 2 days    Minutes of Exercise per Session: 90 min  Stress: No Stress Concern Present (03/15/2022)   [employer] of Occupational Health - Occupational Stress Questionnaire    Feeling of Stress : Not at all  Social Connections: Moderately Isolated (03/15/2022)   Social Connection and Isolation Panel [NHANES]    Frequency of Communication with Friends and Family: Once a week    Frequency of Social Gatherings with Friends and Family: Twice a week    Attends Religious Services: Never    Diplomatic Services operational officer: No    Attends Engineer, structural: Not on file    Marital Status: Married  Catering manager Violence: Not on file    FAMILY HISTORY: Family History  Problem Relation Age of Onset   Thyroid disease Mother    Hepatitis C Mother        contracted from blood transfusion   Skin cancer Mother    Obesity Father    CAD Father        3  stents   Depression Brother    Thyroid disease Maternal Aunt    Breast cancer Maternal Aunt    Thyroid disease Maternal Aunt    Other Paternal Aunt        joint problems   Heart disease Paternal Grandmother    Diabetes Paternal Grandmother    Heart disease Paternal Grandfather    Diabetes Paternal Grandfather    Colon cancer Neg Hx    Colon polyps Neg Hx    Esophageal cancer Neg Hx    Rectal cancer Neg  Hx    Stomach cancer Neg Hx     ALLERGIES:  is allergic to other.  MEDICATIONS:  Current Outpatient Medications  Medication Sig Dispense Refill   amphetamine-dextroamphetamine (ADDERALL) 10 MG tablet 1 or 2 daily as needed for sleepiness 60 tablet 0   Budesonide (PULMICORT FLEXHALER) 90 MCG/ACT inhaler Inhale 2 puffs into the lungs 2 (two) times daily. 2 each 11   cetirizine (ZYRTEC) 10 MG tablet Take 1 tablet by mouth daily.     cyclobenzaprine (FLEXERIL) 10 MG tablet TAKE 1 TABLET BY MOUTH EVERYDAY AT BEDTIME 30 tablet 5   DULoxetine (CYMBALTA) 60 MG capsule duloxetine 60 mg capsule,delayed release  TAKE 1 CAPSULE BY MOUTH TWICE A DAY     Ferrous Sulfate (IRON) 325 (65 Fe) MG TABS Take 1 tablet (325 mg total) by mouth 2 (two) times daily with a meal. (Patient taking differently: Take 325 mg by mouth daily.) 30 each 0   norethindrone-ethinyl estradiol (LOESTRIN 1/20, 21,) 1-20 MG-MCG tablet Take 1 tablet by mouth daily. 28 tablet 11   OTEZLA 30 MG TABS Take 1 tablet by mouth 2 (two) times daily.     traZODone (DESYREL) 150 MG tablet Take 150 mg by mouth at bedtime.     No current facility-administered medications for this visit.     PHYSICAL EXAMINATION: ECOG PERFORMANCE STATUS: 0 - Asymptomatic  Vitals:   07/26/22 0839  BP: 110/74  Pulse: 85  Resp: 18  Temp: 97.7 F (36.5 C)  SpO2: 98%   Filed Weights   07/26/22 0839  Weight: 141 lb (64 kg)    GENERAL:alert, no distress and comfortable SKIN: skin color, texture, turgor are normal, no rashes or significant  lesions EYES: normal, conjunctiva are pink and non-injected, sclera clear OROPHARYNX:no exudate, no erythema and lips, buccal mucosa, and tongue normal  NECK: supple, thyroid normal size, non-tender, without nodularity LYMPH:  no palpable lymphadenopathy in the cervical, axillary  LUNGS: clear to auscultation and percussion with normal breathing effort HEART: regular rate & rhythm and no murmurs and no lower extremity edema ABDOMEN:abdomen soft, non-tender and normal bowel sounds Musculoskeletal:no cyanosis of digits and no clubbing  PSYCH: alert & oriented x 3 with fluent speech NEURO: no focal motor/sensory deficits  LABORATORY DATA:  I have reviewed the data as listed Lab Results  Component Value Date   WBC 5.4 07/26/2022   HGB 13.6 07/26/2022   HCT 41.7 07/26/2022   MCV 89.5 07/26/2022   PLT 279 07/26/2022     Chemistry      Component Value Date/Time   NA 136 06/11/2020 1031   K 4.4 06/11/2020 1031   CL 101 06/11/2020 1031   CO2 30 06/11/2020 1031   BUN 10 06/11/2020 1031   CREATININE 0.77 06/11/2020 1031   CREATININE 0.83 04/20/2019 0753      Component Value Date/Time   CALCIUM 9.1 06/11/2020 1031   ALKPHOS 52 06/11/2020 1031   AST 15 06/11/2020 1031   AST 14 (L) 04/20/2019 0753   ALT 10 06/11/2020 1031   ALT 9 04/20/2019 0753   BILITOT 0.4 06/11/2020 1031   BILITOT 0.2 (L) 04/20/2019 0753     CBC showed hemoglobin of 11.1, MCV of 74, normal white blood cell count and platelet count Ferritin of 3 and TIBC of 447, iron saturation of 6%  CBC from today showed a hemoglobin of 13.6, ferritin levels are pending.  Reticulocyte levels are normal.  RADIOGRAPHIC STUDIES: I have personally reviewed the radiological images as listed  and agreed with the findings in the report. No results found.  All questions were answered. The patient knows to call the clinic with any problems, questions or concerns. I spent 20 minutes in the care of this patient including H and P,  review of records, counseling and coordination of care.     Maiyah Moulds, MD 07/26/2022 8:43 AM

## 2022-08-19 ENCOUNTER — Encounter (INDEPENDENT_AMBULATORY_CARE_PROVIDER_SITE_OTHER): Payer: Self-pay

## 2022-09-14 ENCOUNTER — Encounter: Payer: No Typology Code available for payment source | Admitting: Family Medicine

## 2022-10-23 ENCOUNTER — Other Ambulatory Visit: Payer: Self-pay | Admitting: Neurology

## 2022-10-23 DIAGNOSIS — G500 Trigeminal neuralgia: Secondary | ICD-10-CM

## 2022-12-15 ENCOUNTER — Encounter: Payer: Self-pay | Admitting: Neurology

## 2022-12-15 ENCOUNTER — Ambulatory Visit: Payer: No Typology Code available for payment source | Admitting: Neurology

## 2022-12-15 DIAGNOSIS — Z029 Encounter for administrative examinations, unspecified: Secondary | ICD-10-CM

## 2023-01-23 ENCOUNTER — Other Ambulatory Visit: Payer: Self-pay | Admitting: Neurology

## 2023-01-23 DIAGNOSIS — G500 Trigeminal neuralgia: Secondary | ICD-10-CM

## 2023-02-03 ENCOUNTER — Other Ambulatory Visit: Payer: Self-pay | Admitting: Neurology

## 2023-02-03 DIAGNOSIS — G500 Trigeminal neuralgia: Secondary | ICD-10-CM

## 2023-02-07 ENCOUNTER — Ambulatory Visit: Payer: No Typology Code available for payment source | Admitting: Neurology

## 2023-02-07 ENCOUNTER — Encounter: Payer: Self-pay | Admitting: Neurology

## 2023-02-07 VITALS — BP 120/83 | HR 110 | Ht 67.0 in | Wt 143.0 lb

## 2023-02-07 DIAGNOSIS — M542 Cervicalgia: Secondary | ICD-10-CM

## 2023-02-07 DIAGNOSIS — G500 Trigeminal neuralgia: Secondary | ICD-10-CM

## 2023-02-07 MED ORDER — CYCLOBENZAPRINE HCL 10 MG PO TABS: 10.0000 mg | ORAL_TABLET | Freq: Every evening | ORAL | 1 refills | Status: DC | PRN

## 2023-02-07 NOTE — Progress Notes (Signed)
Follow-up Visit   Date: 02/07/2023    Beth Rivera MRN: [id number] DOB: [birth year]    Beth Rivera is a 53 y.o. Caucasian female returning to the clinic for follow-up of left trigeminal neuralgia and new neck pain.  The patient was accompanied to the clinic by self.   IMPRESSION/PLAN: Left trigeminal neuralgia, well-controlled on medication  - Continue Cymbalta twice daily - Consider baclofen or oxcarbamazepine going forward  2.  Left side cervicalgia, worsening.  Previously responded to neck PT - Continue flexeril 10mg  at bedtime prn - Start neck physiotherapy   Return to clinic in 1 year  --------------------------------------------- History of present illness: She was diagnosed with left trigeminal neuralgia in 2019. MRI brain was negative for trigeminal nerve compression. She has tried carbamazepine, gabapentin, and Cymbalta. She has been taking Cymbalta 60mg  BID since January 2022 which has helped, but she continues to have daily episodic left facial pain lasting about 20 seconds.  She also complains of generalized pain, numbness, tingling involving all the limbs, worse on the right side. No weakness. MRI cervical spine from 2021 shows degenerative spondylosis with canal stenosis at C3-4 through C5-6, along with biforaminal narrowing at C4-5, right C5-6, and bilateral C6-7.  She has not done physical therapy.   She does not work.  Nonsmoker. Drink alcohol several times per week.   UPDATE 09/14/2021:  She continues to have episodic pain flares with left trigeminal neuralgia, which is overall stable.  Today, she complains of new numbness of the 3rd and 4th fingers which has been constant since August 1st.  She noticed the numbness during a 3-hr road trip.  She denies weakness of the hand.  She has chronic neck pain, no shooting pain down the arms.  UPDATE 03/15/2022:  She is here for follow-up.   She is doing well and reports that facial pain has improved.  Pain  typically occurs about twice per week.  She remains on Cymbalta 60mg  BID and tolerating it well.  Her neck pain and right hand tingling has significantly improved with PT, dry needling, and flexeril 10mg .  She takes flexeril 10mg  qhs about 2-3 times per week as needed.  No new complaints.   UPDATE 02/07/2023:  She is her for follow-up.  Her facial pain has been under good control on Cymbalta 60mg  twice diaily.  Since November, she has left sided neck soreness, pain, and reduced ROM.  She has tried flexeril 10mg  as needed, which has not provided much relief.   Medications:  Current Outpatient Medications on File Prior to Visit  Medication Sig Dispense Refill   amphetamine-dextroamphetamine (ADDERALL) 10 MG tablet 1 or 2 daily as needed for sleepiness 60 tablet 0   Budesonide (PULMICORT FLEXHALER) 90 MCG/ACT inhaler Inhale 2 puffs into the lungs 2 (two) times daily. 2 each 11   cetirizine (ZYRTEC) 10 MG tablet Take 1 tablet by mouth daily.     DULoxetine (CYMBALTA) 60 MG capsule duloxetine 60 mg capsule,delayed release  TAKE 1 CAPSULE BY MOUTH TWICE A DAY     Ferrous Sulfate (IRON) 325 (65 Fe) MG TABS Take 1 tablet (325 mg total) by mouth 2 (two) times daily with a meal. (Patient taking differently: Take 325 mg by mouth daily.) 30 each 0   norethindrone-ethinyl estradiol (LOESTRIN 1/20, 21,) 1-20 MG-MCG tablet Take 1 tablet by mouth daily. 28 tablet 11   OTEZLA 30 MG TABS Take 1 tablet by mouth 2 (two) times daily.     traZODone (DESYREL) 150 MG  tablet Take 150 mg by mouth at bedtime.     No current facility-administered medications on file prior to visit.    Allergies:  Allergies  Allergen Reactions   Other     Seasonal, dogs and mold cause mucus and sinus issues    Vital Signs:  BP 120/83   Pulse (!) 110   Ht 5\' 7"  (1.702 m)   Wt 143 lb (64.9 kg)   SpO2 97%   BMI 22.40 kg/m   Neurological Exam: MENTAL STATUS including orientation to time, place, person, recent and remote memory,  attention span and concentration, language, and fund of knowledge is normal.  Speech is not dysarthric.  CRANIAL NERVES:   Pupils equal round and reactive to light.  Normal conjugate, extra-ocular eye movements in all directions of gaze.  No ptosis.  Facial sensation is intact. Face is symmetric.   MOTOR:  Motor strength is 5/5 in all extremities.  Neck ROM is restricted to the left, full on the right. She has tenderness over the cervical paraspinal, splenius capitus, and levator muscles on the left. No atrophy, fasciculations or abnormal movements.  No pronator drift.  Tone is normal.    COORDINATION/GAIT:  Normal finger-to- nose-finger.  Gait narrow based and stable.   Data: NCS/EMG of the left arm and leg 01/13/2021:  normal  MRI brain wwo contrast 07/25/2017: 1. No acute intracranial abnormality. Please note that this exam does not evaluate the cranial nerves in detail.  2. Circumferential mucosal thickening at the left maxillary sinus. Correlate for sinus disease.  MRI brain wwo contrast 07/25/2017: Normal pre- and postcontrast MRI of the brain. No abnormality to correspond with left sided trigeminal neuralgia.   There is symmetric abnormal signal intensity hyperintense on T2 in the premaxillary soft tissues, above the upper lip and above the nasal bridge most consistent with prior cosmetic injections. Correlate clinically.  MRI cervical spine 08/31/2019: 1. Degenerative spondylosis from C3-4 through C6-7. Canal narrowing with AP diameter of the canal 8.8 mm at the C3-4 level, 7.6 mm at  C4-5, 7.4 mm at C5-6 and 7.8 mm at C6-7. Mild cord deformity on the right at the C5-6 level. No abnormal cord signal.  2. Foraminal narrowing that could cause neural compression bilaterally at C4-5, on the right at C5-6 and bilateral at C6-7.  3. Mild discogenic endplate marrow changes at C4-5 and C5-6 that could relate to neck pain.   NCS/EMG of the right arm 09/29/2021:  Normal   Thank you for allowing  me to participate in patient's care.  If I can answer any additional questions, I would be pleased to do so.    Sincerely,    Vyncent Overby K. Allena Katz, DO

## 2023-02-07 NOTE — Patient Instructions (Signed)
We will place a referral for you to start neck physical therapy.

## 2023-02-17 ENCOUNTER — Encounter: Payer: Self-pay | Admitting: Family Medicine

## 2023-02-17 ENCOUNTER — Ambulatory Visit: Payer: No Typology Code available for payment source | Admitting: Family Medicine

## 2023-02-17 VITALS — BP 110/82 | HR 100 | Temp 97.4°F | Ht 65.0 in | Wt 145.6 lb

## 2023-02-17 DIAGNOSIS — R635 Abnormal weight gain: Secondary | ICD-10-CM | POA: Diagnosis not present

## 2023-02-17 DIAGNOSIS — Z0000 Encounter for general adult medical examination without abnormal findings: Secondary | ICD-10-CM

## 2023-02-17 DIAGNOSIS — Z13220 Encounter for screening for lipoid disorders: Secondary | ICD-10-CM | POA: Diagnosis not present

## 2023-02-17 DIAGNOSIS — J4489 Other specified chronic obstructive pulmonary disease: Secondary | ICD-10-CM | POA: Diagnosis not present

## 2023-02-17 DIAGNOSIS — N959 Unspecified menopausal and perimenopausal disorder: Secondary | ICD-10-CM

## 2023-02-17 LAB — FOLLICLE STIMULATING HORMONE: FSH: 39.2 m[IU]/mL

## 2023-02-17 LAB — LIPID PANEL
Cholesterol: 226 mg/dL — ABNORMAL HIGH (ref 0–200)
HDL: 65.5 mg/dL (ref >39.00)
LDL Cholesterol: 136 mg/dL — ABNORMAL HIGH (ref 0–99)
NonHDL: 160.53
Total CHOL/HDL Ratio: 3
Triglycerides: 124 mg/dL (ref 0.0–149.0)
VLDL: 24.8 mg/dL (ref 0.0–40.0)

## 2023-02-17 LAB — TESTOSTERONE: Testosterone: 16.9 ng/dL (ref 15.00–40.00)

## 2023-02-17 LAB — TSH: TSH: 1.05 u[IU]/mL (ref 0.35–5.50)

## 2023-02-17 MED ORDER — FLUTICASONE-SALMETEROL 250-50 MCG/ACT IN AEPB: 1.0000 | INHALATION_SPRAY | Freq: Two times a day (BID) | RESPIRATORY_TRACT | 11 refills | Status: AC

## 2023-02-17 NOTE — Progress Notes (Signed)
Complete physical exam  Patient: Beth Rivera   DOB: 03/12/1970   53 y.o. Female  MRN: [id number]  Subjective:    Chief Complaint  Patient presents with   Annual Exam    Shaolin Armas is a 53 y.o. female who presents today for a complete physical exam. She reports consuming a general diet. The patient has a physically strenuous job, but has no regular exercise apart from work.  Also plays tennis.  She generally feels well. She reports sleeping well. She does not have additional problems to discuss today.    Most recent fall risk assessment:    02/07/2023    1:32 PM  Fall Risk   Falls in the past year? 0  Number falls in past yr: 0  Injury with Fall? 0  Follow up Falls evaluation completed     Most recent depression screenings:    03/15/2022   11:06 AM 01/12/2022    9:45 AM  PHQ 2/9 Scores  PHQ - 2 Score 2 1  PHQ- 9 Score 5 4    Vision:will be going this year, has astigmatism and double building and Dental: No current dental problems and Receives regular dental care  Patient Active Problem List   Diagnosis Date Noted   Perimenopausal disorder 03/15/2022   IDA (iron deficiency anemia) 01/23/2022   Anemia 12/15/2018   Neck pain 10/20/2017   Pelvic swelling, RLQ 09/07/2017   Asthmatic bronchitis , chronic (HCC) 06/28/2017   Immunodeficiency, unspecified (HCC) 06/28/2017   Unexplained weight loss 06/28/2017   Psoriasis 06/28/2017   IgA deficiency (HCC) 03/27/2017   Other allergic rhinitis 03/27/2017   Trigeminal neuralgia 03/11/2017   Chronic left shoulder pain 02/03/2017   Seasonal affective disorder (HCC) 12/11/2015   Hypersomnia 12/11/2015   Non-intractable vomiting with nausea 12/11/2015   Globus pharyngeus 12/11/2015   Cholelithiasis with chronic cholecystitis 02/02/2015      Patient Care Team: Karie Georges, MD as PCP - General (Family Medicine) Aggie Cosier, MD as Referring Physician (Psychiatry) Aris Lot, MD as Consulting Physician  (Dermatology) Jaymes Graff, MD as Consulting Physician (Obstetrics and Gynecology) Glendale Chard, DO as Consulting Physician (Neurology)   Outpatient Medications Prior to Visit  Medication Sig   amphetamine-dextroamphetamine (ADDERALL) 10 MG tablet 1 or 2 daily as needed for sleepiness   busPIRone (BUSPAR) 5 MG tablet Take by mouth 2 (two) times daily.   cetirizine (ZYRTEC) 10 MG tablet Take 1 tablet by mouth daily.   cyclobenzaprine (FLEXERIL) 10 MG tablet Take 1 tablet (10 mg total) by mouth at bedtime as needed for muscle spasms.   DULoxetine (CYMBALTA) 60 MG capsule duloxetine 60 mg capsule,delayed release  TAKE 1 CAPSULE BY MOUTH TWICE A DAY   Ferrous Sulfate (IRON) 325 (65 Fe) MG TABS Take 1 tablet (325 mg total) by mouth 2 (two) times daily with a meal. (Patient taking differently: Take 325 mg by mouth daily.)   norethindrone-ethinyl estradiol (LOESTRIN 1/20, 21,) 1-20 MG-MCG tablet Take 1 tablet by mouth daily.   OTEZLA 30 MG TABS Take 1 tablet by mouth 2 (two) times daily.   traZODone (DESYREL) 150 MG tablet Take 150 mg by mouth at bedtime.   [DISCONTINUED] Budesonide (PULMICORT FLEXHALER) 90 MCG/ACT inhaler Inhale 2 puffs into the lungs 2 (two) times daily.   No facility-administered medications prior to visit.    Review of Systems  HENT:  Negative for hearing loss.   Eyes:  Negative for blurred vision.  Respiratory:  Negative for shortness of breath.  Cardiovascular:  Negative for chest pain.  Gastrointestinal: Negative.   Genitourinary: Negative.   Musculoskeletal:  Negative for back pain.  Neurological:  Negative for headaches.  Psychiatric/Behavioral:  Negative for depression.   All other systems reviewed and are negative.      Objective:     BP 110/82   Pulse 100   Temp (!) 97.4 F (36.3 C) (Axillary)   Ht 5\' 5"  (1.651 m)   Wt 145 lb 9.6 oz (66 kg)   LMP 01/21/2023 (Exact Date)   SpO2 97%   BMI 24.23 kg/m    Physical Exam Vitals reviewed.   Constitutional:      Appearance: Normal appearance. She is well-groomed and normal weight.  HENT:     Right Ear: Tympanic membrane and ear canal normal.     Left Ear: Tympanic membrane and ear canal normal.     Mouth/Throat:     Mouth: Mucous membranes are moist.     Pharynx: No posterior oropharyngeal erythema.  Eyes:     Conjunctiva/sclera: Conjunctivae normal.  Neck:     Thyroid: No thyromegaly.  Cardiovascular:     Rate and Rhythm: Normal rate and regular rhythm.     Pulses: Normal pulses.     Heart sounds: S1 normal and S2 normal.  Pulmonary:     Effort: Pulmonary effort is normal.     Breath sounds: Normal breath sounds and air entry.  Abdominal:     General: Abdomen is flat. Bowel sounds are normal.     Palpations: Abdomen is soft.  Musculoskeletal:     Right lower leg: No edema.     Left lower leg: No edema.  Lymphadenopathy:     Cervical: No cervical adenopathy.  Neurological:     Mental Status: She is alert and oriented to person, place, and time. Mental status is at baseline.     Gait: Gait is intact.  Psychiatric:        Mood and Affect: Mood and affect normal.        Speech: Speech normal.        Behavior: Behavior normal.        Judgment: Judgment normal.     No results found for any visits on 02/17/23.     Assessment & Plan:    Routine Health Maintenance and Physical Exam  Immunization History  Administered Date(s) Administered   Influenza Inj Mdck Quad Pf 11/28/2021   Influenza,inj,Quad PF,6+ Mos 02/03/2015, 12/11/2015, 12/13/2018, 11/27/2019   Influenza-Unspecified 09/13/2020, 11/19/2022   Moderna Covid-19 Fall Seasonal Vaccine 34yrs & older 11/28/2021   Moderna Sars-Covid-2 Vaccination 05/01/2020   PFIZER(Purple Top)SARS-COV-2 Vaccination 03/15/2019, 04/05/2019, 08/24/2019, 09/14/2020   Pfizer Covid-19 Vaccine Bivalent Booster 43yrs & up 11/19/2022   Tdap 12/11/2015, 05/12/2022   Zoster Recombinant(Shingrix) 01/12/2022, 03/15/2022    Health  Maintenance  Topic Date Due   Pneumococcal Vaccine 63-76 Years old (1 of 2 - PCV) Never done   Cervical Cancer Screening (HPV/Pap Cotest)  12/16/2014   COVID-19 Vaccine (8 - 2024-25 season) 01/14/2023   HIV Screening  02/17/2024 (Originally 09[birth year]/1986)   MAMMOGRAM  03/24/2024   Colonoscopy  03/24/2031   DTaP/Tdap/Td (3 - Td or Tdap) 05/11/2032   INFLUENZA VACCINE  Completed   Zoster Vaccines- Shingrix  Completed   HPV VACCINES  Aged Out   Hepatitis C Screening  Discontinued    Discussed health benefits of physical activity, and encouraged her to engage in regular exercise appropriate for her age and condition.  Lipid screening -  Lipid panel; Future  Asthmatic bronchitis , chronic (HCC) -     Fluticasone-Salmeterol; Inhale 1 puff into the lungs in the morning and at bedtime.  Dispense: 60 each; Refill: 11  Routine general medical examination at a health care facility  Perimenopausal disorder -     Estradiol -     Follicle stimulating hormone -     Testosterone  Weight gain -     TSH  Normal physical exam findings today, she reports she is currently going through menopause, has only had 1 period in the last 6 months, will order hormone testing today. I counseled the patient on menopausal changes and symptoms, how to manage at home if needed. Handouts given on healthy eating and exercise. Health maintenance items reviewed and she is UTD.   Return in about 1 year (around 02/17/2024) for annual physical exam.     Karie Georges, MD

## 2023-02-17 NOTE — Patient Instructions (Addendum)
Check with insurance to see if they cover the Prevnar 20 for patients over 50- this is the new recommendation from the CDC. Health Maintenance, Female Adopting a healthy lifestyle and getting preventive care are important in promoting health and wellness. Ask your health care provider about: The right schedule for you to have regular tests and exams. Things you can do on your own to prevent diseases and keep yourself healthy. What should I know about diet, weight, and exercise? Eat a healthy diet  Eat a diet that includes plenty of vegetables, fruits, low-fat dairy products, and lean protein. Do not eat a lot of foods that are high in solid fats, added sugars, or sodium. Maintain a healthy weight Body mass index (BMI) is used to identify weight problems. It estimates body fat based on height and weight. Your health care provider can help determine your BMI and help you achieve or maintain a healthy weight. Get regular exercise Get regular exercise. This is one of the most important things you can do for your health. Most adults should: Exercise for at least 150 minutes each week. The exercise should increase your heart rate and make you sweat (moderate-intensity exercise). Do strengthening exercises at least twice a week. This is in addition to the moderate-intensity exercise. Spend less time sitting. Even light physical activity can be beneficial. Watch cholesterol and blood lipids Have your blood tested for lipids and cholesterol at 52 years of age, then have this test every 5 years. Have your cholesterol levels checked more often if: Your lipid or cholesterol levels are high. You are older than 52 years of age. You are at high risk for heart disease. What should I know about cancer screening? Depending on your health history and family history, you may need to have cancer screening at various ages. This may include screening for: Breast cancer. Cervical cancer. Colorectal cancer. Skin  cancer. Lung cancer. What should I know about heart disease, diabetes, and high blood pressure? Blood pressure and heart disease High blood pressure causes heart disease and increases the risk of stroke. This is more likely to develop in people who have high blood pressure readings or are overweight. Have your blood pressure checked: Every 3-5 years if you are 90-37 years of age. Every year if you are 7 years old or older. Diabetes Have regular diabetes screenings. This checks your fasting blood sugar level. Have the screening done: Once every three years after age 25 if you are at a normal weight and have a low risk for diabetes. More often and at a younger age if you are overweight or have a high risk for diabetes. What should I know about preventing infection? Hepatitis B If you have a higher risk for hepatitis B, you should be screened for this virus. Talk with your health care provider to find out if you are at risk for hepatitis B infection. Hepatitis C Testing is recommended for: Everyone born from [birth year] through 1964. Anyone with known risk factors for hepatitis C. Sexually transmitted infections (STIs) Get screened for STIs, including gonorrhea and chlamydia, if: You are sexually active and are younger than 52 years of age. You are older than 52 years of age and your health care provider tells you that you are at risk for this type of infection. Your sexual activity has changed since you were last screened, and you are at increased risk for chlamydia or gonorrhea. Ask your health care provider if you are at risk. Ask your health care  provider about whether you are at high risk for HIV. Your health care provider may recommend a prescription medicine to help prevent HIV infection. If you choose to take medicine to prevent HIV, you should first get tested for HIV. You should then be tested every 3 months for as long as you are taking the medicine. Pregnancy If you are about to stop  having your period (premenopausal) and you may become pregnant, seek counseling before you get pregnant. Take 400 to 800 micrograms (mcg) of folic acid every day if you become pregnant. Ask for birth control (contraception) if you want to prevent pregnancy. Osteoporosis and menopause Osteoporosis is a disease in which the bones lose minerals and strength with aging. This can result in bone fractures. If you are 3 years old or older, or if you are at risk for osteoporosis and fractures, ask your health care provider if you should: Be screened for bone loss. Take a calcium or vitamin D supplement to lower your risk of fractures. Be given hormone replacement therapy (HRT) to treat symptoms of menopause. Follow these instructions at home: Alcohol use Do not drink alcohol if: Your health care provider tells you not to drink. You are pregnant, may be pregnant, or are planning to become pregnant. If you drink alcohol: Limit how much you have to: 0-1 drink a day. Know how much alcohol is in your drink. In the [place] one drink equals one 12 oz bottle of beer (355 mL), one 5 oz glass of wine (148 mL), or one 1 oz glass of hard liquor (44 mL). Lifestyle Do not use any products that contain nicotine or tobacco. These products include cigarettes, chewing tobacco, and vaping devices, such as e-cigarettes. If you need help quitting, ask your health care provider. Do not use street drugs. Do not share needles. Ask your health care provider for help if you need support or information about quitting drugs. General instructions Schedule regular health, dental, and eye exams. Stay current with your vaccines. Tell your health care provider if: You often feel depressed. You have ever been abused or do not feel safe at home. Summary Adopting a healthy lifestyle and getting preventive care are important in promoting health and wellness. Follow your health care provider's instructions about healthy diet,  exercising, and getting tested or screened for diseases. Follow your health care provider's instructions on monitoring your cholesterol and blood pressure. This information is not intended to replace advice given to you by your health care provider. Make sure you discuss any questions you have with your health care provider. Document Revised: 05/12/2020 Document Reviewed: 05/12/2020 Elsevier Patient Education  2024 [employer].

## 2023-02-18 ENCOUNTER — Encounter: Payer: Self-pay | Admitting: Family Medicine

## 2023-02-18 LAB — ESTRADIOL: Estradiol: 57 pg/mL

## 2023-05-22 ENCOUNTER — Inpatient Hospital Stay (HOSPITAL_COMMUNITY)
Admission: AD | Admit: 2023-05-22 | Discharge: 2023-05-25 | DRG: 445 | Disposition: A | Discharge status: Home / Self Care | Source: Other Acute Inpatient Hospital | Attending: Family Medicine | Admitting: Family Medicine

## 2023-05-22 ENCOUNTER — Observation Stay (HOSPITAL_COMMUNITY)

## 2023-05-22 ENCOUNTER — Other Ambulatory Visit: Payer: Self-pay

## 2023-05-22 ENCOUNTER — Encounter (HOSPITAL_COMMUNITY): Payer: Self-pay | Admitting: Hospitalist

## 2023-05-22 ENCOUNTER — Encounter (HOSPITAL_COMMUNITY): Payer: Self-pay

## 2023-05-22 DIAGNOSIS — K8050 Calculus of bile duct without cholangitis or cholecystitis without obstruction: Principal | ICD-10-CM | POA: Diagnosis present

## 2023-05-22 DIAGNOSIS — J45909 Unspecified asthma, uncomplicated: Secondary | ICD-10-CM | POA: Diagnosis present

## 2023-05-22 DIAGNOSIS — D802 Selective deficiency of immunoglobulin A [IgA]: Secondary | ICD-10-CM | POA: Diagnosis present

## 2023-05-22 DIAGNOSIS — Z833 Family history of diabetes mellitus: Secondary | ICD-10-CM

## 2023-05-22 DIAGNOSIS — Z808 Family history of malignant neoplasm of other organs or systems: Secondary | ICD-10-CM

## 2023-05-22 DIAGNOSIS — Z91048 Other nonmedicinal substance allergy status: Secondary | ICD-10-CM

## 2023-05-22 DIAGNOSIS — K760 Fatty (change of) liver, not elsewhere classified: Secondary | ICD-10-CM | POA: Diagnosis present

## 2023-05-22 DIAGNOSIS — G500 Trigeminal neuralgia: Secondary | ICD-10-CM | POA: Diagnosis present

## 2023-05-22 DIAGNOSIS — F419 Anxiety disorder, unspecified: Secondary | ICD-10-CM | POA: Diagnosis present

## 2023-05-22 DIAGNOSIS — Z9889 Other specified postprocedural states: Secondary | ICD-10-CM

## 2023-05-22 DIAGNOSIS — Z8249 Family history of ischemic heart disease and other diseases of the circulatory system: Secondary | ICD-10-CM

## 2023-05-22 DIAGNOSIS — K2990 Gastroduodenitis, unspecified, without bleeding: Secondary | ICD-10-CM | POA: Diagnosis present

## 2023-05-22 DIAGNOSIS — L404 Guttate psoriasis: Secondary | ICD-10-CM | POA: Diagnosis present

## 2023-05-22 DIAGNOSIS — R748 Abnormal levels of other serum enzymes: Secondary | ICD-10-CM | POA: Diagnosis not present

## 2023-05-22 DIAGNOSIS — Z79899 Other long term (current) drug therapy: Secondary | ICD-10-CM

## 2023-05-22 DIAGNOSIS — F32A Depression, unspecified: Secondary | ICD-10-CM | POA: Diagnosis present

## 2023-05-22 DIAGNOSIS — Z818 Family history of other mental and behavioral disorders: Secondary | ICD-10-CM

## 2023-05-22 DIAGNOSIS — R109 Unspecified abdominal pain: Secondary | ICD-10-CM | POA: Diagnosis not present

## 2023-05-22 DIAGNOSIS — R112 Nausea with vomiting, unspecified: Secondary | ICD-10-CM

## 2023-05-22 DIAGNOSIS — K2970 Gastritis, unspecified, without bleeding: Secondary | ICD-10-CM | POA: Diagnosis present

## 2023-05-22 DIAGNOSIS — Z803 Family history of malignant neoplasm of breast: Secondary | ICD-10-CM

## 2023-05-22 DIAGNOSIS — E876 Hypokalemia: Secondary | ICD-10-CM | POA: Diagnosis present

## 2023-05-22 DIAGNOSIS — Z8349 Family history of other endocrine, nutritional and metabolic diseases: Secondary | ICD-10-CM

## 2023-05-22 DIAGNOSIS — Z7951 Long term (current) use of inhaled steroids: Secondary | ICD-10-CM

## 2023-05-22 DIAGNOSIS — Z9049 Acquired absence of other specified parts of digestive tract: Secondary | ICD-10-CM

## 2023-05-22 LAB — CBC
HCT: 39.3 % (ref 36.0–46.0)
Hemoglobin: 13.3 g/dL (ref 12.0–15.0)
MCH: 29.6 pg (ref 26.0–34.0)
MCHC: 33.8 g/dL (ref 30.0–36.0)
MCV: 87.5 fL (ref 80.0–100.0)
Platelets: 334 10*3/uL (ref 150–400)
RBC: 4.49 MIL/uL (ref 3.87–5.11)
RDW: 14.4 % (ref 11.5–15.5)
WBC: 6.5 10*3/uL (ref 4.0–10.5)
nRBC: 0 % (ref 0.0–0.2)

## 2023-05-22 LAB — COMPREHENSIVE METABOLIC PANEL WITH GFR
ALT: 744 U/L — ABNORMAL HIGH (ref 0–44)
AST: 486 U/L — ABNORMAL HIGH (ref 15–41)
Albumin: 3.8 g/dL (ref 3.5–5.0)
Alkaline Phosphatase: 262 U/L — ABNORMAL HIGH (ref 38–126)
Anion gap: 13 (ref 5–15)
BUN: 7 mg/dL (ref 6–20)
CO2: 22 mmol/L (ref 22–32)
Calcium: 9 mg/dL (ref 8.9–10.3)
Chloride: 100 mmol/L (ref 98–111)
Creatinine, Ser: 0.84 mg/dL (ref 0.44–1.00)
GFR, Estimated: >60 mL/min (ref >60)
Glucose, Bld: 76 mg/dL (ref 70–99)
Potassium: 3.4 mmol/L — ABNORMAL LOW (ref 3.5–5.1)
Sodium: 135 mmol/L (ref 135–145)
Total Bilirubin: 4.3 mg/dL — ABNORMAL HIGH (ref 0.0–1.2)
Total Protein: 7 g/dL (ref 6.5–8.1)

## 2023-05-22 LAB — LIPASE, BLOOD: Lipase: 26 U/L (ref 11–51)

## 2023-05-22 LAB — PROTIME-INR
INR: 0.9 (ref 0.8–1.2)
Prothrombin Time: 12.4 s (ref 11.4–15.2)

## 2023-05-22 LAB — LACTIC ACID, PLASMA
Lactic Acid, Venous: 1.1 mmol/L (ref 0.5–1.9)
Lactic Acid, Venous: 1.9 mmol/L (ref 0.5–1.9)

## 2023-05-22 LAB — MAGNESIUM: Magnesium: 1.9 mg/dL (ref 1.7–2.4)

## 2023-05-22 LAB — HIV ANTIBODY (ROUTINE TESTING W REFLEX): HIV Screen 4th Generation wRfx: NONREACTIVE

## 2023-05-22 MED ORDER — PROCHLORPERAZINE EDISYLATE 10 MG/2ML IJ SOLN
5.0000 mg | Freq: Four times a day (QID) | INTRAMUSCULAR | Status: DC | PRN
  Administered 2023-05-22: 5 mg via INTRAVENOUS
  Filled 2023-05-22: qty 2

## 2023-05-22 MED ORDER — HYDROMORPHONE HCL 1 MG/ML IJ SOLN
1.0000 mg | INTRAMUSCULAR | Status: DC | PRN
  Administered 2023-05-23 – 2023-05-24 (×2): 1 mg via INTRAVENOUS
  Filled 2023-05-22 (×4): qty 1

## 2023-05-22 MED ORDER — GADOBUTROL 1 MMOL/ML IV SOLN
6.0000 mL | Freq: Once | INTRAVENOUS | Status: AC | PRN
  Administered 2023-05-22: 6 mL via INTRAVENOUS

## 2023-05-22 MED ORDER — CYCLOBENZAPRINE HCL 10 MG PO TABS
10.0000 mg | ORAL_TABLET | Freq: Every evening | ORAL | Status: DC | PRN
  Administered 2023-05-23: 10 mg via ORAL
  Filled 2023-05-22: qty 1

## 2023-05-22 MED ORDER — ORAL CARE MOUTH RINSE: 15.0000 mL | OROMUCOSAL | Status: DC | PRN

## 2023-05-22 MED ORDER — TRAZODONE HCL 50 MG PO TABS
150.0000 mg | ORAL_TABLET | Freq: Every day | ORAL | Status: DC
  Administered 2023-05-22 – 2023-05-24 (×3): 150 mg via ORAL
  Filled 2023-05-22 (×3): qty 3

## 2023-05-22 MED ORDER — DULOXETINE HCL 60 MG PO CPEP
120.0000 mg | ORAL_CAPSULE | Freq: Every day | ORAL | Status: DC
  Administered 2023-05-22 – 2023-05-25 (×4): 120 mg via ORAL
  Filled 2023-05-22 (×4): qty 2

## 2023-05-22 MED ORDER — POLYETHYLENE GLYCOL 3350 17 G PO PACK: 17.0000 g | PACK | Freq: Every day | ORAL | Status: DC | PRN

## 2023-05-22 MED ORDER — ENOXAPARIN SODIUM 40 MG/0.4ML IJ SOSY
40.0000 mg | PREFILLED_SYRINGE | INTRAMUSCULAR | Status: DC
  Administered 2023-05-22: 40 mg via SUBCUTANEOUS
  Filled 2023-05-22 (×2): qty 0.4

## 2023-05-22 MED ORDER — POTASSIUM CHLORIDE CRYS ER 20 MEQ PO TBCR
40.0000 meq | EXTENDED_RELEASE_TABLET | Freq: Once | ORAL | Status: AC
  Administered 2023-05-22: 40 meq via ORAL
  Filled 2023-05-22: qty 2

## 2023-05-22 MED ORDER — SODIUM CHLORIDE 0.9 % IV SOLN: INTRAVENOUS | Status: AC

## 2023-05-22 MED ORDER — FLUTICASONE FUROATE-VILANTEROL 200-25 MCG/ACT IN AEPB
1.0000 | INHALATION_SPRAY | Freq: Every day | RESPIRATORY_TRACT | Status: DC
  Administered 2023-05-23 – 2023-05-25 (×3): 1 via RESPIRATORY_TRACT
  Filled 2023-05-22: qty 28

## 2023-05-22 MED ORDER — MELATONIN 5 MG PO TABS
5.0000 mg | ORAL_TABLET | Freq: Every evening | ORAL | Status: DC | PRN
  Filled 2023-05-22: qty 1

## 2023-05-22 MED ORDER — ACETAMINOPHEN 500 MG PO TABS
1000.0000 mg | ORAL_TABLET | Freq: Four times a day (QID) | ORAL | Status: DC
  Filled 2023-05-22: qty 2

## 2023-05-22 NOTE — H&P (Signed)
 History and Physical    Patient: Beth Rivera VHQ:[id number] DOB: 1970-03-17 DOA: 05/22/2023 DOS: the patient was seen and examined on 05/22/2023 PCP: Aida House, MD  Patient coming from: Home  Chief Complaint: Abd pain  HPI: Beth Rivera is a 53 y.o. female with medical history significant of IgA def, cholecystitis s/p CCY, and anxiety p/w abd pain.  Pt states that she was in her USOH until the past two weeks when she has had 3 episodes of 10/10 abd pain after eating. Pt states that her pain "comes on" a few hours after eating and resolves in a few hours after eating, with each episode lasting 6-8 hours. Her most recent episode yesterday was so severe and the pain so unrelenting that she presented to the ED.  At OSH [facility] in [place]), labs notable for elevated AST/ALT 1000s and T bili 3.4. Pt admitted to medicine with GI following for ongoing care.  Review of Systems: As mentioned in the history of present illness. All other systems reviewed and are negative. Past Medical History:  Diagnosis Date   Anemia    currently on iron    Anxiety    on meds   Asthma    with respiratory illnesses-uses inhaler PRN   Chronic cholecystitis    Closed fracture of phalanx of left fifth toe with delayed healing 12/14/2017   Complication of anesthesia    PONV   Cough LAST 3 WEEKS   CLEAR SPUTUM, NO FEVER    Depression    on meds   Eczema    Family history of adverse reaction to anesthesia    SON HAS PONV   Guttate psoriasis 1993   IgA deficiency (HCC) 2019   Neck pain    PMS (premenstrual syndrome)    PONV (postoperative nausea and vomiting)    Poor sleep pattern    Psoriasis    GUTATE, MOSTLY ON LIMBS   Recurrent upper respiratory infection (URI)    Seasonal allergies    Shoulder pain    Sinus mucosal thickening    Tinnitus    BOTH EARS   Trigeminal neuralgia 03/11/2017   Past Surgical History:  Procedure Laterality Date   BREAST BIOPSY Left 04/02/2022   MM LT  BREAST BX W LOC DEV 1ST LESION IMAGE BX SPEC STEREO GUIDE 04/02/2022 [facility]   CHOLECYSTECTOMY N/A 02/03/2015   Procedure: LAPAROSCOPIC CHOLECYSTECTOMY WITH INTRAOPERATIVE CHOLANGIOGRAM;  Surgeon: Oralee Billow, MD;  Location: [facility];  Service: General;  Laterality: N/A;   UPPER GASTROINTESTINAL ENDOSCOPY  2019   MS   WISDOM TOOTH EXTRACTION     Social History:  reports that she has never smoked. She has never used smokeless tobacco. She reports current alcohol use of about 2.0 - 4.0 standard drinks of alcohol per week. She reports that she does not use drugs.  Allergies  Allergen Reactions   Other     Seasonal, dogs and mold cause mucus and sinus issues    Family History  Problem Relation Age of Onset   Thyroid  disease Mother    Hepatitis C Mother        contracted from blood transfusion   Skin cancer Mother    Obesity Father    CAD Father        3 stents   Depression Brother    Thyroid  disease Maternal Aunt    Breast cancer Maternal Aunt    Thyroid  disease Maternal Aunt    Other Paternal Aunt  joint problems   Heart disease Paternal Grandmother    Diabetes Paternal Grandmother    Heart disease Paternal Grandfather    Diabetes Paternal Grandfather    Colon cancer Neg Hx    Colon polyps Neg Hx    Esophageal cancer Neg Hx    Rectal cancer Neg Hx    Stomach cancer Neg Hx     Prior to Admission medications   Medication Sig Start Date End Date Taking? Authorizing Provider  amphetamine -dextroamphetamine  (ADDERALL) 10 MG tablet 1 or 2 daily as needed for sleepiness 10/22/19   Young, Rupert Counts D, MD  busPIRone (BUSPAR) 5 MG tablet Take by mouth 2 (two) times daily.    [provider]  cetirizine (ZYRTEC) 10 MG tablet Take 1 tablet by mouth daily.    [provider]  cyclobenzaprine  (FLEXERIL ) 10 MG tablet Take 1 tablet (10 mg total) by mouth at bedtime as needed for muscle spasms. 02/07/23   Patel, Donika K, DO  DULoxetine  (CYMBALTA ) 60 MG capsule  duloxetine  60 mg capsule,delayed release  TAKE 1 CAPSULE BY MOUTH TWICE A DAY 03/06/20   [provider]  Ferrous Sulfate (IRON ) 325 (65 Fe) MG TABS Take 1 tablet (325 mg total) by mouth 2 (two) times daily with a meal. Patient taking differently: Take 325 mg by mouth daily. 03/02/17   Asencion Blacksmith, MD  fluticasone -salmeterol (ADVAIR) 250-50 MCG/ACT AEPB Inhale 1 puff into the lungs in the morning and at bedtime. 02/17/23   Aida House, MD  norethindrone -ethinyl estradiol  (LOESTRIN 1/20, 21,) 1-20 MG-MCG tablet Take 1 tablet by mouth daily. 03/15/22   Aida House, MD  OTEZLA 30 MG TABS Take 1 tablet by mouth 2 (two) times daily. 01/06/22   [provider]  traZODone (DESYREL) 150 MG tablet Take 150 mg by mouth at bedtime. 03/06/20   [provider]    Physical Exam: Vitals:   05/22/23 1329 05/22/23 1335 05/22/23 1341  BP:   129/86  Pulse:   74  Resp:   16  Temp:   98.7 F (37.1 C)  TempSrc:   Oral  SpO2:   93%  Weight:  60.9 kg   Height: 5\' 6"  (1.676 m)     General: Alert, oriented x3, resting comfortably in no acute distress Respiratory: Lungs clear to auscultation bilaterally with normal respiratory effort; no w/r/r Cardiovascular: Regular rate and rhythm w/o m/r/g Abdomen: Soft, nontender, nondistended. Positive bowel sounds  Data Reviewed:  Lab Results  Component Value Date   WBC 5.4 07/26/2022   HGB 13.6 07/26/2022   HCT 41.7 07/26/2022   MCV 89.5 07/26/2022   PLT 279 07/26/2022   Lab Results  Component Value Date   GLUCOSE 87 07/26/2022   CALCIUM 9.3 07/26/2022   NA 139 07/26/2022   K 3.9 07/26/2022   CO2 31 07/26/2022   CL 102 07/26/2022   BUN 9 07/26/2022   CREATININE 0.87 07/26/2022   Lab Results  Component Value Date   ALT 12 07/26/2022   AST 17 07/26/2022   ALKPHOS 62 07/26/2022   BILITOT 0.3 07/26/2022   No results found for: "INR", "PROTIME"  Radiology: No results found.   Assessment and Plan: 69F h/o IgA  def, cholecystitis s/p CCY, and anxiety p/w abd pain and transaminitis and c/f CBD stone on OSH CT.  Transaminitis Abd pain Possible CBD stone -GI following; apprec eval/recs -Continue NS at 75cc/hr -IV dilaudid  1mg  q4h prn -F/u repeat CMP and CBC    Advance Care  Planning:   Code Status: Full Code     Consults: GI  Family Communication: N/A  Severity of Illness: The appropriate patient status for this patient is INPATIENT. Inpatient status is judged to be reasonable and necessary in order to provide the required intensity of service to ensure the patient's safety. The patient's presenting symptoms, physical exam findings, and initial radiographic and laboratory data in the context of their chronic comorbidities is felt to place them at high risk for further clinical deterioration. Furthermore, it is not anticipated that the patient will be medically stable for discharge from the hospital within 2 midnights of admission.   * I certify that at the point of admission it is my clinical judgment that the patient will require inpatient hospital care spanning beyond 2 midnights from the point of admission due to high intensity of service, high risk for further deterioration and high frequency of surveillance required.*   ------- I spent 55 minutes reviewing previous labs/notes, obtaining separate history at the bedside, counseling/discussing the treatment plan outlined above, ordering medications/tests, and performing clinical documentation.  Author: Arne Langdon, MD 05/22/2023 3:08 PM  For on call review [email or web address].

## 2023-05-22 NOTE — Consult Note (Addendum)
 Inpatient Consultation   Referring Provider:      Primary Care Physician:  Aida House, MD Primary Gastroenterologist:       Previously seen by Dr. Sandrea Cruel Reason for Consultation:     Abdominal pain, nausea, vomiting, concern for choledocholithiasis         HPI  Beth Rivera is a 53 y.o. female with a past medical history noteworthy for cholelithiasis status post cholecystectomy in 2017, IgA deficiency and trigeminal neuralgia admitted to the hospital for evaluation and management of abdominal pain, nausea, vomiting, elevated liver enzymes and concern for choledocholithiasis.  Beth Rivera reports that she developed upper abdominal and back pain 2 weeks ago after eating a peanut butter and jelly sandwich followed by nonbloody, nonbilious emesis.  She had another episode on Mother's Day after eating fried food and drinking sangria.  States that these episodes of abdominal pain and vomiting felt reminiscent of her previous history of biliary colic.  Notes that her urine has been somewhat dark as well.  Most recently, she was traveling in [place] and developed recurrent abdominal pain and vomiting after eating one quarter of a sandwich.  She was seen at [facility] where CT imaging was reported to show "4.9 x 8.3 mm calcification is present immediately adjacent to the distal end of the CBD. It could be intraluminal; Intraluminal bile ducts are mildly dilated but the CBD is normal at 7 mm".  By verbal report from outside ED total bilirubin was elevated ~ 3.4 also with markedly elevated hepatic transaminases.  Awaiting formal copies of reports and CD-ROM with CT imaging.  At the time of my interview, Beth Rivera reports overall feeling well.  She denies abdominal pain but also notes she received pain medication prior to leaving [facility].  Currently, denies fevers, chills, nausea, vomiting, abdominal pain, melena or hematochezia.  Is not familiar with ERCP test and denies  having 1 prior to her previous cholecystectomy.  No other GI surgeries No NSAIDs No anticoagulants  Family history of gallbladder disease in her father  Past Medical History:  Diagnosis Date   Anemia    currently on iron    Anxiety    on meds   Asthma    with respiratory illnesses-uses inhaler PRN   Chronic cholecystitis    Closed fracture of phalanx of left fifth toe with delayed healing 12/14/2017   Complication of anesthesia    PONV   Cough LAST 3 WEEKS   CLEAR SPUTUM, NO FEVER    Depression    on meds   Eczema    Family history of adverse reaction to anesthesia    SON HAS PONV   Guttate psoriasis 1993   IgA deficiency (HCC) 2019   Neck pain    PMS (premenstrual syndrome)    PONV (postoperative nausea and vomiting)    Poor sleep pattern    Psoriasis    GUTATE, MOSTLY ON LIMBS   Recurrent upper respiratory infection (URI)    Seasonal allergies    Shoulder pain    Sinus mucosal thickening    Tinnitus    BOTH EARS   Trigeminal neuralgia 03/11/2017    Past Surgical History:  Procedure Laterality Date   BREAST BIOPSY Left 04/02/2022   MM LT BREAST BX W LOC DEV 1ST LESION IMAGE BX SPEC STEREO GUIDE 04/02/2022 [facility]   CHOLECYSTECTOMY N/A 02/03/2015   Procedure: LAPAROSCOPIC CHOLECYSTECTOMY WITH INTRAOPERATIVE CHOLANGIOGRAM;  Surgeon: Oralee Billow, MD;  Location: [facility];  Service: General;  Laterality: N/A;   UPPER GASTROINTESTINAL ENDOSCOPY  2019   MS   WISDOM TOOTH EXTRACTION      Family History  Problem Relation Age of Onset   Thyroid  disease Mother    Hepatitis C Mother        contracted from blood transfusion   Skin cancer Mother    Obesity Father    CAD Father        3 stents   Depression Brother    Thyroid  disease Maternal Aunt    Breast cancer Maternal Aunt    Thyroid  disease Maternal Aunt    Other Paternal Aunt        joint problems   Heart disease Paternal Grandmother    Diabetes Paternal Grandmother    Heart disease Paternal  Grandfather    Diabetes Paternal Grandfather    Colon cancer Neg Hx    Colon polyps Neg Hx    Esophageal cancer Neg Hx    Rectal cancer Neg Hx    Stomach cancer Neg Hx     Social History   Tobacco Use   Smoking status: Never   Smokeless tobacco: Never  Vaping Use   Vaping status: Never Used  Substance Use Topics   Alcohol use: Yes    Alcohol/week: 2.0 - 4.0 standard drinks of alcohol    Types: 2 - 4 Standard drinks or equivalent per week    Comment: OCCASIONAL   Drug use: No    Prior to Admission medications   Medication Sig Start Date End Date Taking? Authorizing Provider  amphetamine -dextroamphetamine  (ADDERALL) 10 MG tablet 1 or 2 daily as needed for sleepiness 10/22/19  Yes Young, Rupert Counts D, MD  cetirizine (ZYRTEC) 10 MG tablet Take 1 tablet by mouth daily.   Yes [provider]  cyclobenzaprine  (FLEXERIL ) 10 MG tablet Take 1 tablet (10 mg total) by mouth at bedtime as needed for muscle spasms. 02/07/23  Yes Patel, Donika K, DO  DULoxetine  (CYMBALTA ) 60 MG capsule Take 120 mg by mouth daily. 03/06/20  Yes [provider]  fluticasone -salmeterol (ADVAIR) 250-50 MCG/ACT AEPB Inhale 1 puff into the lungs in the morning and at bedtime. 02/17/23  Yes Aida House, MD  OTEZLA 30 MG TABS Take 1 tablet by mouth 2 (two) times daily. 01/06/22  Yes [provider]  traZODone (DESYREL) 150 MG tablet Take 150 mg by mouth at bedtime. 03/06/20  Yes [provider]  busPIRone (BUSPAR) 5 MG tablet Take by mouth 2 (two) times daily. Patient not taking: Reported on 05/22/2023    [provider]  norethindrone -ethinyl estradiol  (LOESTRIN 1/20, 21,) 1-20 MG-MCG tablet Take 1 tablet by mouth daily. Patient not taking: Reported on 05/22/2023 03/15/22   Aida House, MD    Current Facility-Administered Medications  Medication Dose Route Frequency Provider Last Rate Last Admin   enoxaparin (LOVENOX) injection 40 mg  40 mg Subcutaneous Q24H Arne Langdon, MD       HYDROmorphone  (DILAUDID ) injection 1 mg  1 mg Intravenous Q4H PRN Arne Langdon, MD        Allergies as of 05/22/2023 - Review Complete 05/22/2023  Allergen Reaction Noted   Other  12/16/2011    GI Review of Symptoms Significant for abdominal pain, nausea, vomiting. Otherwise negative.  General Review of Systems  Review of systems is significant for the pertinent positives and negatives as listed per the HPI.  Full ROS is otherwise negative.    Physical Exam  Vital signs in last 24 hours:  Temp:  [98.7 F (37.1 C)] 98.7 F (37.1 C) (05/18 1341) Pulse Rate:  [74] 74 (05/18 1341) Resp:  [16] 16 (05/18 1341) BP: (129)/(86) 129/86 (05/18 1341) SpO2:  [93 %] 93 % (05/18 1341) Weight:  [60.9 kg] 60.9 kg (05/18 1335) Last BM Date : 05/20/23 General:  NAD, Well developed, Well nourished, alert and cooperative Head:  Normocephalic and atraumatic. Eyes:   PEERL, EOMI. No icterus. Conjunctiva pink. Lungs: Respirations even and unlabored. Lungs clear to auscultation bilaterally.   No wheezes, crackles, or rhonchi.  Heart: Normal S1, S2. No MRG. Regular rate and rhythm. No peripheral edema, cyanosis or pallor.  Abdomen:  Soft, nondistended, nontender. No rebound or guarding. Normal bowel sounds. No appreciable masses or hepatomegaly. Msk:  Symmetrical without gross deformities. Peripheral pulses intact.  Skin:   Dry and intact without significant lesions or rashes. Psychiatric: Demonstrates good judgement and reason without abnormal affect or behaviors.   Lab Results -awaiting updated labs at [facility] No results for input(s): "WBC", "HGB", "HCT", "PLT" in the last 72 hours. BMET No results for input(s): "NA", "K", "CL", "CO2", "GLUCOSE", "BUN", "CREATININE", "CALCIUM" in the last 72 hours. LFT No results for input(s): "PROT", "ALBUMIN", "AST", "ALT", "ALKPHOS", "BILITOT", "BILIDIR", "IBILI" in the last 72 hours. PT/INR No results for input(s): "LABPROT", "INR" in the  last 72 hours.  Radiographic Studies CTAP 05/21/2023 [facility] CLINICAL: Abdominal Pain  Stated Reason for Visit: abdominal pain, ribs feel tight, denies ShOB, vomitting CT Abd/Pelvis W/ Contrast 05/22/2023 4:48 AM:  TECHNIQUE: Helical CT exam was obtained from the lung bases through the ischial tuberosities. The patient received contrast intravenously. Auto Exposure Controls were utilized during the CT exam to meet ALARA standards for radiation dose reduction. COMPARISON STUDY: None available. LIMITATIONS: None.   ABDOMEN / PELVIS FINDINGS: SUPPORT DEVICES: None. VISUALIZED CHEST: No significant abnormality. LIVER: No significant abnormality. GALLBLADDER:  * Cholecystectomy BILE DUCTS:  * 4.9 x 8.3 mm calcification is present immediately adjacent to the distal end of the CBD. It could be intraluminal * Intraluminal bile ducts are mildly dilated but the CBD is normal at 7 mm PANCREAS: No significant abnormality. SPLEEN: No significant abnormality. ADRENAL GLANDS: No significant abnormality. RIGHT KIDNEY:  * Dilated renal pelvis. Calyces and ureter are normal LEFT KIDNEY: No significant abnormality. URINARY BLADDER: No significant abnormality. REPRODUCTIVE: No significant abnormality. SYSTEMIC VESSELS: No significant abnormality. PORTAL VESSELS: No significant abnormality. LYMPH NODES: No significant abnormality. GI TRACT: No significant abnormality. ASCITES / PERITONEUM: No significant abnormality. BONES / BODY WALL: Disc narrowing and degeneration of the L4-5 and L5-S1 discs OTHER: No significant abnormality.  IMPRESSION: 1. Calcification at the tip of the CBD may be intraluminal 2. Mild prominence of the intrahepatic ducts but the CBD is normal 3. Extrarenal pelvis of the right kidney   Endoscopic Studies     Colonoscopy 03/23/2021 External hemorrhoids, otherwise normal-10-year follow-up  Upper endoscopy 04/22/2017 Normal   Clinical Impression   It is my clinical  impression that Beth Rivera is a 53 year old woman admitted to the hospital for evaluation and management of abdominal pain, nausea, vomiting, elevated liver enzymes and concern for choledocholithiasis.  She reports prior history of cholecystectomy in 2017.  She was in her usual state of health until 2 weeks ago when she began developing intermittent recurrent episodes of upper abdominal and back pain as well as bloody, nonbilious emesis reminiscent of her prior history of biliary colic.  She developed another episode of abdominal pain and vomiting 05/21/2023 while  traveling in [place], [place] .  CT imaging at [facility] showed evidence of a calcification adjacent to the common bile duct that may be intraluminal with mild prominence of intrahepatic ducts but normal CBD.  Her laboratory studies were reported to show hyperbilirubinemia with elevated hepatic transaminases.  Clinical picture overall concerning for possible choledocholithiasis.  She is afebrile and nontoxic.  Overall well-appearing on exam and no concerns at this time for cholangitis.   Plan  Upload outside CT imaging from [facility] to [facility] for review with our biliary colleagues Agree with updated laboratory studies CBC, CMP, lipase, INR at [facility] to assess trend and labs Okay to consume clear liquid diet while labs and imaging are being reviewed given well appearance If CT imaging is indeterminant as reviewed by our advanced endoscopy colleagues, there may be a need for MRCP prior to considering ERCP Monitor fever curve and notify GI team if patient manifest signs or symptoms of cholangitis  Dr. Cherryl Corona will assume rounding responsibilities for [facility] on 05/23/2023  Thank you for your kind consultation, we will continue to follow.  Scarlette Currier Amillion Macchia  05/22/2023, 2:56 PM  Eugenia Hess, MD [facility]   Addendum: Per radiology, there will be a lag in uploading the outside CT images from  the CD-ROM.  We will therefore proceed with MRCP to be ordered today for further delineation of biliary pathology.

## 2023-05-23 DIAGNOSIS — Z818 Family history of other mental and behavioral disorders: Secondary | ICD-10-CM | POA: Diagnosis not present

## 2023-05-23 DIAGNOSIS — K760 Fatty (change of) liver, not elsewhere classified: Secondary | ICD-10-CM | POA: Diagnosis present

## 2023-05-23 DIAGNOSIS — L404 Guttate psoriasis: Secondary | ICD-10-CM | POA: Diagnosis present

## 2023-05-23 DIAGNOSIS — K2990 Gastroduodenitis, unspecified, without bleeding: Secondary | ICD-10-CM | POA: Diagnosis present

## 2023-05-23 DIAGNOSIS — F32A Depression, unspecified: Secondary | ICD-10-CM | POA: Diagnosis present

## 2023-05-23 DIAGNOSIS — Z8349 Family history of other endocrine, nutritional and metabolic diseases: Secondary | ICD-10-CM | POA: Diagnosis not present

## 2023-05-23 DIAGNOSIS — E876 Hypokalemia: Secondary | ICD-10-CM | POA: Diagnosis present

## 2023-05-23 DIAGNOSIS — Z91048 Other nonmedicinal substance allergy status: Secondary | ICD-10-CM | POA: Diagnosis not present

## 2023-05-23 DIAGNOSIS — K2970 Gastritis, unspecified, without bleeding: Secondary | ICD-10-CM | POA: Diagnosis present

## 2023-05-23 DIAGNOSIS — J45909 Unspecified asthma, uncomplicated: Secondary | ICD-10-CM | POA: Diagnosis present

## 2023-05-23 DIAGNOSIS — F419 Anxiety disorder, unspecified: Secondary | ICD-10-CM

## 2023-05-23 DIAGNOSIS — K3189 Other diseases of stomach and duodenum: Secondary | ICD-10-CM | POA: Diagnosis not present

## 2023-05-23 DIAGNOSIS — K8020 Calculus of gallbladder without cholecystitis without obstruction: Secondary | ICD-10-CM | POA: Diagnosis present

## 2023-05-23 DIAGNOSIS — G500 Trigeminal neuralgia: Secondary | ICD-10-CM | POA: Diagnosis present

## 2023-05-23 DIAGNOSIS — Z833 Family history of diabetes mellitus: Secondary | ICD-10-CM | POA: Diagnosis not present

## 2023-05-23 DIAGNOSIS — B9681 Helicobacter pylori [H. pylori] as the cause of diseases classified elsewhere: Secondary | ICD-10-CM | POA: Diagnosis not present

## 2023-05-23 DIAGNOSIS — Z9049 Acquired absence of other specified parts of digestive tract: Secondary | ICD-10-CM | POA: Diagnosis not present

## 2023-05-23 DIAGNOSIS — K2950 Unspecified chronic gastritis without bleeding: Secondary | ICD-10-CM | POA: Diagnosis not present

## 2023-05-23 DIAGNOSIS — K8050 Calculus of bile duct without cholangitis or cholecystitis without obstruction: Secondary | ICD-10-CM | POA: Diagnosis not present

## 2023-05-23 DIAGNOSIS — Z79899 Other long term (current) drug therapy: Secondary | ICD-10-CM | POA: Diagnosis not present

## 2023-05-23 DIAGNOSIS — D802 Selective deficiency of immunoglobulin A [IgA]: Secondary | ICD-10-CM | POA: Diagnosis present

## 2023-05-23 DIAGNOSIS — K2980 Duodenitis without bleeding: Secondary | ICD-10-CM | POA: Diagnosis not present

## 2023-05-23 DIAGNOSIS — Z8249 Family history of ischemic heart disease and other diseases of the circulatory system: Secondary | ICD-10-CM | POA: Diagnosis not present

## 2023-05-23 DIAGNOSIS — Z803 Family history of malignant neoplasm of breast: Secondary | ICD-10-CM | POA: Diagnosis not present

## 2023-05-23 DIAGNOSIS — Z808 Family history of malignant neoplasm of other organs or systems: Secondary | ICD-10-CM | POA: Diagnosis not present

## 2023-05-23 DIAGNOSIS — Z7951 Long term (current) use of inhaled steroids: Secondary | ICD-10-CM | POA: Diagnosis not present

## 2023-05-23 LAB — COMPREHENSIVE METABOLIC PANEL WITH GFR
ALT: 609 U/L — ABNORMAL HIGH (ref 0–44)
AST: 270 U/L — ABNORMAL HIGH (ref 15–41)
Albumin: 3.8 g/dL (ref 3.5–5.0)
Alkaline Phosphatase: 284 U/L — ABNORMAL HIGH (ref 38–126)
Anion gap: 12 (ref 5–15)
BUN: 6 mg/dL (ref 6–20)
CO2: 22 mmol/L (ref 22–32)
Calcium: 9.1 mg/dL (ref 8.9–10.3)
Chloride: 104 mmol/L (ref 98–111)
Creatinine, Ser: 0.93 mg/dL (ref 0.44–1.00)
GFR, Estimated: >60 mL/min (ref >60)
Glucose, Bld: 75 mg/dL (ref 70–99)
Potassium: 4.1 mmol/L (ref 3.5–5.1)
Sodium: 138 mmol/L (ref 135–145)
Total Bilirubin: 1.6 mg/dL — ABNORMAL HIGH (ref 0.0–1.2)
Total Protein: 7.3 g/dL (ref 6.5–8.1)

## 2023-05-23 MED ORDER — APREMILAST 30 MG PO TABS: 1.0000 | ORAL_TABLET | Freq: Two times a day (BID) | ORAL | Status: DC

## 2023-05-23 MED ORDER — DICLOFENAC SUPPOSITORY 100 MG
100.0000 mg | Freq: Once | RECTAL | Status: DC
  Filled 2023-05-23: qty 1

## 2023-05-23 MED ORDER — SODIUM CHLORIDE 0.9 % IV SOLN
1.5000 g | Freq: Once | INTRAVENOUS | Status: AC
  Administered 2023-05-24: 1.5 g via INTRAVENOUS
  Filled 2023-05-23: qty 4

## 2023-05-23 MED ORDER — ENOXAPARIN SODIUM 40 MG/0.4ML IJ SOSY
40.0000 mg | PREFILLED_SYRINGE | Freq: Every day | INTRAMUSCULAR | Status: DC
  Filled 2023-05-23: qty 0.4

## 2023-05-23 MED ORDER — SODIUM CHLORIDE 0.9 % IV SOLN
INTRAVENOUS | Status: AC
Start: 2023-05-23 — End: 2023-05-24

## 2023-05-23 NOTE — Progress Notes (Signed)
 Progress Note   Patient: Beth Rivera ZOX:[id number] DOB: Mar 22, 1970 DOA: 05/22/2023     1 DOS: the patient was seen and examined on 05/23/2023   Brief hospital course: [name] is a 53 y.o. female with medical history significant of IgA def, cholecystitis s/p cholecystectomy, and anxiety p/w abd pain. At OSH [facility] in [place]), labs notable for elevated AST/ALT 1000s and T bili 3.4. Pt admitted to [facility] service with GI consultation.  Assessment and Plan: Choledocholithiasis- CBD stone per MRCP. GI evaluation appreciated. ERCP scheduled possibly tomorrow. Transaminitis- due to to CBD stone, trend LFT. Her pain, nausea improved. Will give clears today and advance as tolerated. NPO past midnight for ERCP tomorrow.  Hypokalemia - improved.  Anxiety and depression- Home meds resumed.      Out of bed to chair. Incentive spirometry. Nursing supportive care. Fall, aspiration precautions. Diet:  Diet Orders (From admission, onward)     Start     Ordered   05/24/23 0001  Diet NPO time specified Except for: Sips with Meds  Diet effective midnight       Question:  Except for  Answer:  Bula Carney with Meds   05/23/23 1135   05/23/23 1136  Diet regular Room service appropriate? Yes; Fluid consistency: Thin  Diet effective now       Question Answer Comment  Room service appropriate? Yes   Fluid consistency: Thin      05/23/23 1135           DVT prophylaxis: enoxaparin  (LOVENOX ) injection 40 mg Start: 05/25/23 1000  Level of care: Med-Surg   Code Status: Full Code  Subjective: Patient is seen and examined today morning. She is lying in bed. Asks if she can eat. No nausea, abdominal discomfort.  Physical Exam: Vitals:   05/22/23 1636 05/22/23 2154 05/23/23 0416 05/23/23 0803  BP: (!) 157/96 (!) 145/99 (!) 145/102 (!) 134/93  Pulse: 91 78 97 95  Resp: 16 20 20 19   Temp: 98.5 F (36.9 C) 99.4 F (37.4 C) 97.9 F (36.6 C) 98.6 F (37 C)  TempSrc: Oral Oral     SpO2: 98% 97% 98% 98%  Weight:      Height:        General - Middle aged Caucasian female, no apparent distress HEENT - PERRLA, EOMI, atraumatic head, non tender sinuses. Lung - Clear, no rales, rhonchi, wheezes. Heart - S1, S2 heard, no murmurs, rubs, no pedal edema. Abdomen - Soft, non tender, bowel sounds good Neuro - Alert, awake and oriented x 3, non focal exam. Skin - Warm and dry.  Data Reviewed:      Latest Ref Rng & Units 05/22/2023    3:15 PM 07/26/2022    8:26 AM 06/11/2020   10:31 AM  CBC  WBC 4.0 - 10.5 K/uL 6.5  5.4  5.3   Hemoglobin 12.0 - 15.0 g/dL 81.1  91.4  78.2   Hematocrit 36.0 - 46.0 % 39.3  41.7  37.4   Platelets 150 - 400 K/uL 334  279  349.0       Latest Ref Rng & Units 05/23/2023    9:25 AM 05/22/2023    3:14 PM 07/26/2022    8:26 AM  BMP  Glucose 70 - 99 mg/dL 75  76  87   BUN 6 - 20 mg/dL 6  7  9    Creatinine 0.44 - 1.00 mg/dL 9.56  2.13  0.86   Sodium 135 - 145 mmol/L 138  135  139  Potassium 3.5 - 5.1 mmol/L 4.1  3.4  3.9   Chloride 98 - 111 mmol/L 104  100  102   CO2 22 - 32 mmol/L 22  22  31    Calcium 8.9 - 10.3 mg/dL 9.1  9.0  9.3    MR ABDOMEN MRCP W WO CONTAST Result Date: 05/23/2023 CLINICAL DATA:  Elevated LFTs.  Bile duct dilatation. EXAM: MRI ABDOMEN WITHOUT AND WITH CONTRAST (INCLUDING MRCP) TECHNIQUE: Multiplanar multisequence MR imaging of the abdomen was performed both before and after the administration of intravenous contrast. Heavily T2-weighted images of the biliary and pancreatic ducts were obtained, and three-dimensional MRCP images were rendered by post processing. CONTRAST:  6mL GADAVIST  GADOBUTROL  1 MMOL/ML IV SOLN COMPARISON:  None FINDINGS: Lower chest: No acute findings. Hepatobiliary: Mild hepatic steatosis. No focal liver abnormality identified. Status post cholecystectomy. The common bile duct measures up to 6 mm. At the level of the distal CBD there is a stone measuring 3 by 6 mm, image 14/3, image 21/4. Pancreas: No  pancreatic inflammation or main duct dilatation. No pancreatic mass identified. Spleen:  Within normal limits in size and appearance. Adrenals/Urinary Tract: No masses identified. No evidence of hydronephrosis. Stomach/Bowel: Visualized portions within the abdomen are unremarkable. Vascular/Lymphatic: No pathologically enlarged lymph nodes identified. No abdominal aortic aneurysm demonstrated. Other:  None. Musculoskeletal: No suspicious bone lesions identified. IMPRESSION: 1. Status post cholecystectomy. 2. Upper limits of normal common bile duct with stone in the distal common bile duct measuring 3 x 6 mm. 3. Mild hepatic steatosis. Electronically Signed   By: Kimberley Penman M.D.   On: 05/23/2023 05:35   MR 3D Recon At Scanner Result Date: 05/23/2023 CLINICAL DATA:  Elevated LFTs.  Bile duct dilatation. EXAM: MRI ABDOMEN WITHOUT AND WITH CONTRAST (INCLUDING MRCP) TECHNIQUE: Multiplanar multisequence MR imaging of the abdomen was performed both before and after the administration of intravenous contrast. Heavily T2-weighted images of the biliary and pancreatic ducts were obtained, and three-dimensional MRCP images were rendered by post processing. CONTRAST:  6mL GADAVIST  GADOBUTROL  1 MMOL/ML IV SOLN COMPARISON:  None FINDINGS: Lower chest: No acute findings. Hepatobiliary: Mild hepatic steatosis. No focal liver abnormality identified. Status post cholecystectomy. The common bile duct measures up to 6 mm. At the level of the distal CBD there is a stone measuring 3 by 6 mm, image 14/3, image 21/4. Pancreas: No pancreatic inflammation or main duct dilatation. No pancreatic mass identified. Spleen:  Within normal limits in size and appearance. Adrenals/Urinary Tract: No masses identified. No evidence of hydronephrosis. Stomach/Bowel: Visualized portions within the abdomen are unremarkable. Vascular/Lymphatic: No pathologically enlarged lymph nodes identified. No abdominal aortic aneurysm demonstrated. Other:  None.  Musculoskeletal: No suspicious bone lesions identified. IMPRESSION: 1. Status post cholecystectomy. 2. Upper limits of normal common bile duct with stone in the distal common bile duct measuring 3 x 6 mm. 3. Mild hepatic steatosis. Electronically Signed   By: Kimberley Penman M.D.   On: 05/23/2023 05:35    Family Communication: Discussed with patient, she understand and agree. All questions answered.  Disposition: Status is: Observation The patient remains OBS appropriate and will d/c before 2 midnights.  Planned Discharge Destination: Home     Time spent: 38 minutes  Author: Aisha Hove, MD 05/23/2023 12:29 PM Secure chat 7am to 7pm For on call review [email or web address].

## 2023-05-23 NOTE — Progress Notes (Signed)
 [facility] night cross cover note:  Per patient request, I have resumed her home [place] .     Camelia Cavalier, DO Hospitalist

## 2023-05-23 NOTE — Progress Notes (Signed)
 Patient ID: Beth Rivera, female   DOB: Sep 14, 1970, 53 y.o.   MRN: [id number]    Progress Note   Subjective   Day # 1 CC; epigastric pain, nausea vomiting and elevated LFTs inpatient status postcholecystectomy 2017  MRI/MRCP-shows patient's status post cholecystectomy, upper limits normal common bile duct with stone in the distal common bile duct measuring 3 x 6 mm, mild hepatic steatosis noted  Labs-pro time 12.4/INR 0.9 Potassium 4.1/BUN 6/creatinine 0.93 T. bili 1.6/alk phos 284/AST 270/ALT 609  Patient says she feels better today, she is not having any significant abdominal pain, no nausea or vomiting, is hungry    Objective   Vital signs in last 24 hours: Temp:  [97.9 F (36.6 C)-99.4 F (37.4 C)] 98.6 F (37 C) (05/19 0803) Pulse Rate:  [74-97] 95 (05/19 0803) Resp:  [16-20] 19 (05/19 0803) BP: (129-157)/(86-102) 134/93 (05/19 0803) SpO2:  [93 %-98 %] 98 % (05/19 0803) Weight:  [60.9 kg] 60.9 kg (05/18 1335) Last BM Date : 05/20/23 General:   older white female in NAD Heart:  Regular rate and rhythm; no murmurs Lungs: Respirations even and unlabored, lungs CTA bilaterally Abdomen:  Soft, normally tender in the epigastrium, guarding or rebound no palpable mass or hepatosplenomegaly. Normal bowel sounds. Extremities:  Without edema. Neurologic:  Alert and oriented,  grossly normal neurologically. Psych:  Cooperative. Normal mood and affect.  Intake/Output from previous day: 05/18 0701 - 05/19 0700 In: 453.1 [I.V.:453.1] Out: -  Intake/Output this shift: No intake/output data recorded.  Lab Results: Recent Labs    05/22/23 1515  WBC 6.5  HGB 13.3  HCT 39.3  PLT 334   BMET Recent Labs    05/22/23 1514 05/23/23 0925  NA 135 138  K 3.4* 4.1  CL 100 104  CO2 22 22  GLUCOSE 76 75  BUN 7 6  CREATININE 0.84 0.93  CALCIUM 9.0 9.1   LFT Recent Labs    05/23/23 0925  PROT 7.3  ALBUMIN 3.8  AST 270*  ALT 609*  ALKPHOS 284*  BILITOT 1.6*    PT/INR Recent Labs    05/22/23 1704  LABPROT 12.4  INR 0.9    Studies/Results: MR ABDOMEN MRCP W WO CONTAST Result Date: 05/23/2023 CLINICAL DATA:  Elevated LFTs.  Bile duct dilatation. EXAM: MRI ABDOMEN WITHOUT AND WITH CONTRAST (INCLUDING MRCP) TECHNIQUE: Multiplanar multisequence MR imaging of the abdomen was performed both before and after the administration of intravenous contrast. Heavily T2-weighted images of the biliary and pancreatic ducts were obtained, and three-dimensional MRCP images were rendered by post processing. CONTRAST:  6mL GADAVIST  GADOBUTROL  1 MMOL/ML IV SOLN COMPARISON:  None FINDINGS: Lower chest: No acute findings. Hepatobiliary: Mild hepatic steatosis. No focal liver abnormality identified. Status post cholecystectomy. The common bile duct measures up to 6 mm. At the level of the distal CBD there is a stone measuring 3 by 6 mm, image 14/3, image 21/4. Pancreas: No pancreatic inflammation or main duct dilatation. No pancreatic mass identified. Spleen:  Within normal limits in size and appearance. Adrenals/Urinary Tract: No masses identified. No evidence of hydronephrosis. Stomach/Bowel: Visualized portions within the abdomen are unremarkable. Vascular/Lymphatic: No pathologically enlarged lymph nodes identified. No abdominal aortic aneurysm demonstrated. Other:  None. Musculoskeletal: No suspicious bone lesions identified. IMPRESSION: 1. Status post cholecystectomy. 2. Upper limits of normal common bile duct with stone in the distal common bile duct measuring 3 x 6 mm. 3. Mild hepatic steatosis. Electronically Signed   By: Kimberley Penman M.D.   On:  05/23/2023 05:35   MR 3D Recon At Scanner Result Date: 05/23/2023 CLINICAL DATA:  Elevated LFTs.  Bile duct dilatation. EXAM: MRI ABDOMEN WITHOUT AND WITH CONTRAST (INCLUDING MRCP) TECHNIQUE: Multiplanar multisequence MR imaging of the abdomen was performed both before and after the administration of intravenous contrast. Heavily  T2-weighted images of the biliary and pancreatic ducts were obtained, and three-dimensional MRCP images were rendered by post processing. CONTRAST:  6mL GADAVIST  GADOBUTROL  1 MMOL/ML IV SOLN COMPARISON:  None FINDINGS: Lower chest: No acute findings. Hepatobiliary: Mild hepatic steatosis. No focal liver abnormality identified. Status post cholecystectomy. The common bile duct measures up to 6 mm. At the level of the distal CBD there is a stone measuring 3 by 6 mm, image 14/3, image 21/4. Pancreas: No pancreatic inflammation or main duct dilatation. No pancreatic mass identified. Spleen:  Within normal limits in size and appearance. Adrenals/Urinary Tract: No masses identified. No evidence of hydronephrosis. Stomach/Bowel: Visualized portions within the abdomen are unremarkable. Vascular/Lymphatic: No pathologically enlarged lymph nodes identified. No abdominal aortic aneurysm demonstrated. Other:  None. Musculoskeletal: No suspicious bone lesions identified. IMPRESSION: 1. Status post cholecystectomy. 2. Upper limits of normal common bile duct with stone in the distal common bile duct measuring 3 x 6 mm. 3. Mild hepatic steatosis. Electronically Signed   By: Kimberley Penman M.D.   On: 05/23/2023 05:35       Assessment / Plan:    #56 53 year old female status post laparoscopic cholecystectomy in 2017, who presented to the emergency room yesterday with complaints of upper abdominal pain nausea and vomiting.  She had had an initial episode about 2 weeks ago, and a second episode last week and then again yesterday. She did have CT done while she was in [place]  after 1 of these episodes and this reportedly showed a 4.9 x 8.3 mm calcification immediately adjacent to the distal end of the common bile duct.  She had a T. bili of 3.4 at that time.  LFTs were elevated on presentation yesterday, and patient underwent MRI/MRCP last p.m. which does confirm a distal common bile duct stone.  She has been  stable, afebrile, and feels better today.  T. bili has improved, transaminases about the same.  #2 history of IgA deficiency #3  Trigeminal neuralgia  Plan; okay for regular diet today, n.p.o. past midnight Will help to get her scheduled for ERCP with stone extraction for tomorrow afternoon with Dr. Brice Campi .  Procedure was discussed in detail with the patient today at bedside including indications risks and benefits.  She is agreeable to proceed.      Principal Problem:   Choledocholithiasis     LOS: 1 day   Labaron Digirolamo EsterwoodPA-C  05/23/2023, 11:38 AM

## 2023-05-23 NOTE — H&P (View-Only) (Signed)
 Patient ID: Beth Rivera, female   DOB: Sep 14, 1970, 53 y.o.   MRN: [id number]    Progress Note   Subjective   Day # 1 CC; epigastric pain, nausea vomiting and elevated LFTs inpatient status postcholecystectomy 2017  MRI/MRCP-shows patient's status post cholecystectomy, upper limits normal common bile duct with stone in the distal common bile duct measuring 3 x 6 mm, mild hepatic steatosis noted  Labs-pro time 12.4/INR 0.9 Potassium 4.1/BUN 6/creatinine 0.93 T. bili 1.6/alk phos 284/AST 270/ALT 609  Patient says she feels better today, she is not having any significant abdominal pain, no nausea or vomiting, is hungry    Objective   Vital signs in last 24 hours: Temp:  [97.9 F (36.6 C)-99.4 F (37.4 C)] 98.6 F (37 C) (05/19 0803) Pulse Rate:  [74-97] 95 (05/19 0803) Resp:  [16-20] 19 (05/19 0803) BP: (129-157)/(86-102) 134/93 (05/19 0803) SpO2:  [93 %-98 %] 98 % (05/19 0803) Weight:  [60.9 kg] 60.9 kg (05/18 1335) Last BM Date : 05/20/23 General:   older white female in NAD Heart:  Regular rate and rhythm; no murmurs Lungs: Respirations even and unlabored, lungs CTA bilaterally Abdomen:  Soft, normally tender in the epigastrium, guarding or rebound no palpable mass or hepatosplenomegaly. Normal bowel sounds. Extremities:  Without edema. Neurologic:  Alert and oriented,  grossly normal neurologically. Psych:  Cooperative. Normal mood and affect.  Intake/Output from previous day: 05/18 0701 - 05/19 0700 In: 453.1 [I.V.:453.1] Out: -  Intake/Output this shift: No intake/output data recorded.  Lab Results: Recent Labs    05/22/23 1515  WBC 6.5  HGB 13.3  HCT 39.3  PLT 334   BMET Recent Labs    05/22/23 1514 05/23/23 0925  NA 135 138  K 3.4* 4.1  CL 100 104  CO2 22 22  GLUCOSE 76 75  BUN 7 6  CREATININE 0.84 0.93  CALCIUM 9.0 9.1   LFT Recent Labs    05/23/23 0925  PROT 7.3  ALBUMIN 3.8  AST 270*  ALT 609*  ALKPHOS 284*  BILITOT 1.6*    PT/INR Recent Labs    05/22/23 1704  LABPROT 12.4  INR 0.9    Studies/Results: MR ABDOMEN MRCP W WO CONTAST Result Date: 05/23/2023 CLINICAL DATA:  Elevated LFTs.  Bile duct dilatation. EXAM: MRI ABDOMEN WITHOUT AND WITH CONTRAST (INCLUDING MRCP) TECHNIQUE: Multiplanar multisequence MR imaging of the abdomen was performed both before and after the administration of intravenous contrast. Heavily T2-weighted images of the biliary and pancreatic ducts were obtained, and three-dimensional MRCP images were rendered by post processing. CONTRAST:  6mL GADAVIST  GADOBUTROL  1 MMOL/ML IV SOLN COMPARISON:  None FINDINGS: Lower chest: No acute findings. Hepatobiliary: Mild hepatic steatosis. No focal liver abnormality identified. Status post cholecystectomy. The common bile duct measures up to 6 mm. At the level of the distal CBD there is a stone measuring 3 by 6 mm, image 14/3, image 21/4. Pancreas: No pancreatic inflammation or main duct dilatation. No pancreatic mass identified. Spleen:  Within normal limits in size and appearance. Adrenals/Urinary Tract: No masses identified. No evidence of hydronephrosis. Stomach/Bowel: Visualized portions within the abdomen are unremarkable. Vascular/Lymphatic: No pathologically enlarged lymph nodes identified. No abdominal aortic aneurysm demonstrated. Other:  None. Musculoskeletal: No suspicious bone lesions identified. IMPRESSION: 1. Status post cholecystectomy. 2. Upper limits of normal common bile duct with stone in the distal common bile duct measuring 3 x 6 mm. 3. Mild hepatic steatosis. Electronically Signed   By: Kimberley Penman M.D.   On:  05/23/2023 05:35   MR 3D Recon At Scanner Result Date: 05/23/2023 CLINICAL DATA:  Elevated LFTs.  Bile duct dilatation. EXAM: MRI ABDOMEN WITHOUT AND WITH CONTRAST (INCLUDING MRCP) TECHNIQUE: Multiplanar multisequence MR imaging of the abdomen was performed both before and after the administration of intravenous contrast. Heavily  T2-weighted images of the biliary and pancreatic ducts were obtained, and three-dimensional MRCP images were rendered by post processing. CONTRAST:  6mL GADAVIST  GADOBUTROL  1 MMOL/ML IV SOLN COMPARISON:  None FINDINGS: Lower chest: No acute findings. Hepatobiliary: Mild hepatic steatosis. No focal liver abnormality identified. Status post cholecystectomy. The common bile duct measures up to 6 mm. At the level of the distal CBD there is a stone measuring 3 by 6 mm, image 14/3, image 21/4. Pancreas: No pancreatic inflammation or main duct dilatation. No pancreatic mass identified. Spleen:  Within normal limits in size and appearance. Adrenals/Urinary Tract: No masses identified. No evidence of hydronephrosis. Stomach/Bowel: Visualized portions within the abdomen are unremarkable. Vascular/Lymphatic: No pathologically enlarged lymph nodes identified. No abdominal aortic aneurysm demonstrated. Other:  None. Musculoskeletal: No suspicious bone lesions identified. IMPRESSION: 1. Status post cholecystectomy. 2. Upper limits of normal common bile duct with stone in the distal common bile duct measuring 3 x 6 mm. 3. Mild hepatic steatosis. Electronically Signed   By: Kimberley Penman M.D.   On: 05/23/2023 05:35       Assessment / Plan:    #56 53 year old female status post laparoscopic cholecystectomy in 2017, who presented to the emergency room yesterday with complaints of upper abdominal pain nausea and vomiting.  She had had an initial episode about 2 weeks ago, and a second episode last week and then again yesterday. She did have CT done while she was in [place]  after 1 of these episodes and this reportedly showed a 4.9 x 8.3 mm calcification immediately adjacent to the distal end of the common bile duct.  She had a T. bili of 3.4 at that time.  LFTs were elevated on presentation yesterday, and patient underwent MRI/MRCP last p.m. which does confirm a distal common bile duct stone.  She has been  stable, afebrile, and feels better today.  T. bili has improved, transaminases about the same.  #2 history of IgA deficiency #3  Trigeminal neuralgia  Plan; okay for regular diet today, n.p.o. past midnight Will help to get her scheduled for ERCP with stone extraction for tomorrow afternoon with Dr. Brice Campi .  Procedure was discussed in detail with the patient today at bedside including indications risks and benefits.  She is agreeable to proceed.      Principal Problem:   Choledocholithiasis     LOS: 1 day   Labaron Digirolamo EsterwoodPA-C  05/23/2023, 11:38 AM

## 2023-05-23 NOTE — Plan of Care (Signed)

## 2023-05-24 ENCOUNTER — Inpatient Hospital Stay (HOSPITAL_COMMUNITY): Payer: Self-pay | Admitting: Certified Registered Nurse Anesthetist

## 2023-05-24 ENCOUNTER — Encounter (HOSPITAL_COMMUNITY): Payer: Self-pay | Admitting: Internal Medicine

## 2023-05-24 ENCOUNTER — Inpatient Hospital Stay (HOSPITAL_COMMUNITY)

## 2023-05-24 ENCOUNTER — Encounter (HOSPITAL_COMMUNITY)
Admission: AD | Disposition: A | Discharge status: Home / Self Care | Payer: Self-pay | Source: Other Acute Inpatient Hospital | Attending: Internal Medicine

## 2023-05-24 DIAGNOSIS — K2980 Duodenitis without bleeding: Secondary | ICD-10-CM

## 2023-05-24 DIAGNOSIS — F419 Anxiety disorder, unspecified: Secondary | ICD-10-CM | POA: Diagnosis not present

## 2023-05-24 DIAGNOSIS — K2950 Unspecified chronic gastritis without bleeding: Secondary | ICD-10-CM

## 2023-05-24 DIAGNOSIS — B9681 Helicobacter pylori [H. pylori] as the cause of diseases classified elsewhere: Secondary | ICD-10-CM

## 2023-05-24 DIAGNOSIS — K3189 Other diseases of stomach and duodenum: Secondary | ICD-10-CM

## 2023-05-24 DIAGNOSIS — K8050 Calculus of bile duct without cholangitis or cholecystitis without obstruction: Secondary | ICD-10-CM | POA: Diagnosis not present

## 2023-05-24 DIAGNOSIS — K760 Fatty (change of) liver, not elsewhere classified: Secondary | ICD-10-CM | POA: Diagnosis not present

## 2023-05-24 DIAGNOSIS — E876 Hypokalemia: Secondary | ICD-10-CM | POA: Diagnosis not present

## 2023-05-24 HISTORY — PX: ERCP: SHX5425

## 2023-05-24 LAB — COMPREHENSIVE METABOLIC PANEL WITH GFR
ALT: 533 U/L — ABNORMAL HIGH (ref 0–44)
AST: 242 U/L — ABNORMAL HIGH (ref 15–41)
Albumin: 3.7 g/dL (ref 3.5–5.0)
Alkaline Phosphatase: 256 U/L — ABNORMAL HIGH (ref 38–126)
Anion gap: 12 (ref 5–15)
BUN: 6 mg/dL (ref 6–20)
CO2: 24 mmol/L (ref 22–32)
Calcium: 9.2 mg/dL (ref 8.9–10.3)
Chloride: 103 mmol/L (ref 98–111)
Creatinine, Ser: 0.77 mg/dL (ref 0.44–1.00)
GFR, Estimated: >60 mL/min (ref >60)
Glucose, Bld: 85 mg/dL (ref 70–99)
Potassium: 3.7 mmol/L (ref 3.5–5.1)
Sodium: 139 mmol/L (ref 135–145)
Total Bilirubin: 1.4 mg/dL — ABNORMAL HIGH (ref 0.0–1.2)
Total Protein: 7 g/dL (ref 6.5–8.1)

## 2023-05-24 SURGERY — ERCP, WITH INTERVENTION IF INDICATED
Anesthesia: General

## 2023-05-24 MED ORDER — GLUCAGON HCL RDNA (DIAGNOSTIC) 1 MG IJ SOLR
INTRAMUSCULAR | Status: DC | PRN
  Administered 2023-05-24 (×4): .25 mg via INTRAVENOUS

## 2023-05-24 MED ORDER — SCOPOLAMINE 1 MG/3DAYS TD PT72
MEDICATED_PATCH | TRANSDERMAL | Status: AC
  Filled 2023-05-24: qty 1

## 2023-05-24 MED ORDER — GLUCAGON HCL RDNA (DIAGNOSTIC) 1 MG IJ SOLR
INTRAMUSCULAR | Status: AC
  Filled 2023-05-24: qty 1

## 2023-05-24 MED ORDER — OXYCODONE HCL 5 MG/5ML PO SOLN: 5.0000 mg | Freq: Once | ORAL | Status: DC | PRN

## 2023-05-24 MED ORDER — OXYCODONE HCL 5 MG PO TABS: 5.0000 mg | ORAL_TABLET | Freq: Once | ORAL | Status: DC | PRN

## 2023-05-24 MED ORDER — FENTANYL CITRATE (PF) 100 MCG/2ML IJ SOLN
INTRAMUSCULAR | Status: DC | PRN
  Administered 2023-05-24 (×2): 50 ug via INTRAVENOUS

## 2023-05-24 MED ORDER — PROPOFOL 10 MG/ML IV BOLUS
INTRAVENOUS | Status: DC | PRN
Start: 2023-05-24 — End: 2023-05-24
  Administered 2023-05-24: 30 mg via INTRAVENOUS
  Administered 2023-05-24: 200 mg via INTRAVENOUS
  Administered 2023-05-24: 30 mg via INTRAVENOUS

## 2023-05-24 MED ORDER — DICLOFENAC SUPPOSITORY 100 MG
100.0000 mg | RECTAL | Status: AC
Start: 2023-05-24 — End: 2023-05-25

## 2023-05-24 MED ORDER — FENTANYL CITRATE (PF) 100 MCG/2ML IJ SOLN
25.0000 ug | INTRAMUSCULAR | Status: DC | PRN
Start: 2023-05-24 — End: 2023-05-24

## 2023-05-24 MED ORDER — SODIUM CHLORIDE 0.9 % IV SOLN
INTRAVENOUS | Status: DC | PRN
  Administered 2023-05-24: 25 mL

## 2023-05-24 MED ORDER — DEXAMETHASONE SODIUM PHOSPHATE 10 MG/ML IJ SOLN
INTRAMUSCULAR | Status: DC | PRN
  Administered 2023-05-24: 10 mg via INTRAVENOUS

## 2023-05-24 MED ORDER — AMISULPRIDE (ANTIEMETIC) 5 MG/2ML IV SOLN: 10.0000 mg | Freq: Once | INTRAVENOUS | Status: DC | PRN

## 2023-05-24 MED ORDER — ROCURONIUM BROMIDE 100 MG/10ML IV SOLN
INTRAVENOUS | Status: DC | PRN
  Administered 2023-05-24: 60 mg via INTRAVENOUS
  Administered 2023-05-24: 10 mg via INTRAVENOUS

## 2023-05-24 MED ORDER — FENTANYL CITRATE (PF) 100 MCG/2ML IJ SOLN
INTRAMUSCULAR | Status: AC
  Filled 2023-05-24: qty 2

## 2023-05-24 MED ORDER — DEXMEDETOMIDINE HCL IN NACL 80 MCG/20ML IV SOLN
INTRAVENOUS | Status: DC | PRN
Start: 2023-05-24 — End: 2023-05-24
  Administered 2023-05-24: 8 ug via INTRAVENOUS
  Administered 2023-05-24: 4 ug via INTRAVENOUS
  Administered 2023-05-24: 8 ug via INTRAVENOUS

## 2023-05-24 MED ORDER — LACTATED RINGERS IV SOLN: INTRAVENOUS | Status: DC | PRN

## 2023-05-24 MED ORDER — ONDANSETRON HCL 4 MG/2ML IJ SOLN
INTRAMUSCULAR | Status: DC | PRN
  Administered 2023-05-24: 4 mg via INTRAVENOUS

## 2023-05-24 MED ORDER — SUGAMMADEX SODIUM 200 MG/2ML IV SOLN
INTRAVENOUS | Status: DC | PRN
  Administered 2023-05-24: 200 mg via INTRAVENOUS

## 2023-05-24 MED ORDER — SCOPOLAMINE 1 MG/3DAYS TD PT72
1.0000 | MEDICATED_PATCH | TRANSDERMAL | Status: DC
  Administered 2023-05-24: 1.5 mg via TRANSDERMAL

## 2023-05-24 MED ORDER — DICLOFENAC SUPPOSITORY 100 MG
RECTAL | Status: AC
  Filled 2023-05-24: qty 1

## 2023-05-24 MED ORDER — PROPOFOL 500 MG/50ML IV EMUL
INTRAVENOUS | Status: DC | PRN
  Administered 2023-05-24: 150 ug/kg/min via INTRAVENOUS

## 2023-05-24 MED ORDER — LIDOCAINE 2% (20 MG/ML) 5 ML SYRINGE
INTRAMUSCULAR | Status: DC | PRN
  Administered 2023-05-24: 60 mg via INTRAVENOUS

## 2023-05-24 MED ORDER — DICLOFENAC SUPPOSITORY 100 MG
RECTAL | Status: DC | PRN
  Administered 2023-05-24: 100 mg via RECTAL

## 2023-05-24 MED ORDER — PANTOPRAZOLE SODIUM 40 MG PO TBEC
40.0000 mg | DELAYED_RELEASE_TABLET | Freq: Every day | ORAL | Status: DC
  Administered 2023-05-24 – 2023-05-25 (×2): 40 mg via ORAL
  Filled 2023-05-24 (×2): qty 1

## 2023-05-24 NOTE — Anesthesia Preprocedure Evaluation (Addendum)
 Anesthesia Evaluation  Patient identified by MRN, date of birth, ID band Patient awake    Reviewed: Allergy & Precautions, NPO status , Patient's Chart, lab work & pertinent test results  History of Anesthesia Complications (+) PONV and history of anesthetic complications  Airway Mallampati: III  TM Distance: >3 FB Neck ROM: Full    Dental no notable dental hx. (+) Teeth Intact, Dental Advisory Given   Pulmonary asthma    Pulmonary exam normal breath sounds clear to auscultation       Cardiovascular negative cardio ROS Normal cardiovascular exam Rhythm:Regular Rate:Normal     Neuro/Psych  PSYCHIATRIC DISORDERS Anxiety Depression    negative neurological ROS     GI/Hepatic negative GI ROS, Neg liver ROS,,,  Endo/Other  negative endocrine ROS    Renal/GU negative Renal ROS  negative genitourinary   Musculoskeletal negative musculoskeletal ROS (+)    Abdominal   Peds  Hematology negative hematology ROS (+)   Anesthesia Other Findings CBD stone  Reproductive/Obstetrics                             Anesthesia Physical Anesthesia Plan  ASA: 2  Anesthesia Plan: General   Post-op Pain Management: Minimal or no pain anticipated   Induction: Intravenous  PONV Risk Score and Plan: 4 or greater and Midazolam , Dexamethasone , Ondansetron , Treatment may vary due to age or medical condition, Scopolamine  patch - Pre-op and TIVA  Airway Management Planned: Oral ETT  Additional Equipment:   Intra-op Plan:   Post-operative Plan: Extubation in OR  Informed Consent: I have reviewed the patients History and Physical, chart, labs and discussed the procedure including the risks, benefits and alternatives for the proposed anesthesia with the patient or authorized representative who has indicated his/her understanding and acceptance.     Dental advisory given  Plan Discussed with:  CRNA  Anesthesia Plan Comments:        Anesthesia Quick Evaluation

## 2023-05-24 NOTE — Transfer of Care (Signed)
 Immediate Anesthesia Transfer of Care Note  Patient: Beth Rivera  Procedure(s) Performed: ERCP, WITH INTERVENTION IF INDICATED  Patient Location: PACU  Anesthesia Type:General  Level of Consciousness: awake, alert , oriented, and patient cooperative  Airway & Oxygen Therapy: Patient Spontanous Breathing and Patient connected to face mask oxygen  Post-op Assessment: Report given to RN, Post -op Vital signs reviewed and stable, Patient moving all extremities, Patient moving all extremities X 4, and Patient able to stick tongue midline  Post vital signs: Reviewed and stable  Last Vitals:  Vitals Value Taken Time  BP 136/85 05/24/23 1721  Temp    Pulse 86 05/24/23 1723  Resp 18 05/24/23 1723  SpO2 97 % 05/24/23 1723  Vitals shown include unfiled device data.  Last Pain:  Vitals:   05/24/23 1236  TempSrc: Temporal  PainSc: 0-No pain      Patients Stated Pain Goal: 0 (05/24/23 0600)  Complications: No notable events documented.

## 2023-05-24 NOTE — Plan of Care (Signed)

## 2023-05-24 NOTE — Op Note (Signed)
 [facility] Patient Name: Beth Rivera Procedure Date : 05/24/2023 MRN: [id number] Attending MD: Yong Henle , MD, [id number] Date of Birth: 1970/05/05 CSN: [id number] Age: 53 Admit Type: Inpatient Procedure:                ERCP Indications:              Bile duct stone(s), Abnormal MRCP Providers:                Yong Henle, MD, Jacquelyn "Jaci" Bernetta Brilliant, RN,                            Nohemi Batters, Technician Referring MD:             , Inpatient Medical Service Medicines:                General Anesthesia, Unasyn  1.5 g IV, Diclofenac  100                            mg rectal, Glucagon 1 mg IV Complications:            No immediate complications. Estimated Blood Loss:     Estimated blood loss was minimal. Procedure:                Pre-Anesthesia Assessment:                           - Prior to the procedure, a History and Physical                            was performed, and patient medications and                            allergies were reviewed. The patient's tolerance of                            previous anesthesia was also reviewed. The risks                            and benefits of the procedure and the sedation                            options and risks were discussed with the patient.                            All questions were answered, and informed consent                            was obtained. Prior Anticoagulants: The patient has                            taken Lovenox  (enoxaparin ), last dose was 1 day                            prior to procedure. ASA Grade Assessment: II - A  patient with mild systemic disease. After reviewing                            the risks and benefits, the patient was deemed in                            satisfactory condition to undergo the procedure.                           After obtaining informed consent, the scope was                            passed under direct vision.  Throughout the                            procedure, the patient's blood pressure, pulse, and                            oxygen saturations were monitored continuously. The                            [employer] single use                            duodenoscope was introduced through the mouth, and                            used to inject contrast into and used to inject                            contrast into the bile duct and ventral pancreatic                            duct. The ERCP was technically difficult and                            complex due to challenging cannulation. Successful                            completion of the procedure was aided by performing                            the maneuvers documented (below) in this report.                            The patient tolerated the procedure. Scope In: Scope Out: Findings:      A scout film of the abdomen was obtained. Surgical clips, consistent       with a previous cholecystectomy, were seen in the area of the right       upper quadrant of the abdomen.      The upper GI tract was traversed under direct vision without detailed       examination. Patchy mildly erythematous mucosa without bleeding was  found in the entire examined stomach - biopsied for HP assessment.       Patchy moderate inflammation characterized by erosions, friability and       granularity was found in the duodenal bulb, in the first portion of the       duodenum and in the second portion of the duodenum. The major papilla       was normal.      The bile duct could not be cannulated with the Hydratome sphincterotome.       Repeated attempts at biliary cannulation were not successful while using       a wire-guided approach in short/long/semi-long positions. Eventually       however, in semi-long position, this led to placement of the wire within       the pancreatic duct. Decision was made to pursue a double-wire approach.        The wire was left within the pancreatic duct.      Further attempt to cannulate the bile duct were unsuccessful with       double-wire technique.      Decision made to place one 4 Fr by 7 cm temporary plastic pancreatic       stent with a single external pigtail was placed into the ventral       pancreatic duct. The stent was in good position.      The bile duct could not be cannulated in short/long/semi-long       positioning.      Decision made to pursue biliary pre-cut fistulotomy. This measured 8 mm       in length and was made with a monofilament needle knife over a       pancreatic stent using ERBE electrocautery. There was no       post-sphincterotomy bleeding. Bile could be noted to flow.      A 0.035 inch x 260 cm straight Hydra Jagwire was passed into the biliary       tree. The Hydratome sphincterotome was passed over the guidewire and the       bile duct was then deeply cannulated. Contrast was injected. I       personally interpreted the bile duct images. Ductal flow of contrast was       adequate. Image quality was adequate. Contrast extended to the hepatic       ducts. Opacification of the entire biliary tree except for the       gallbladder was successful. The maximum diameter of the ducts was 10 mm.       The lower third of the main bile duct contained a filling defect thought       to be a stone/sludge. To discover objects, the biliary tree was swept       with a retrieval balloon. Sludge was swept from the duct. Two stones       were removed. No stones remained. An occlusion cholangiogram was       performed that showed no further significant biliary pathology.      A formal pancreatogram was not performed.      The duodenoscope was withdrawn from the patient. Impression:               - Erythematous mucosa in the stomach. Biopsied for                            HP evaluation.                           -  Duodenitis.                           - The major papilla  appeared normal.                           - Difficult cannulation as noted above requiring                            attempt at double-wire technique then over stent                            technique, then precut fistulotomy to access the                            biliary duct.                           - A filling defect consistent with a stone and                            sludge was seen on the cholangiogram.                           - Choledocholithiasis was found. Complete removal                            was accomplished by biliary sphincterotomy and                            balloon sweep.                           - One temporary plastic pancreatic stent was placed                            into the ventral pancreatic duct. Recommendation:           - The patient will be observed post-procedure,                            until all discharge criteria are met.                           - Return patient to hospital ward for ongoing care.                           - Advance diet as tolerated.                           - Observe patient's clinical course.                           - Watch for pancreatitis, bleeding, perforation,                            and  cholangitis.                           - Start PPI 40 mg daily.                           - Check liver enzymes (AST, ALT, alkaline                            phosphatase, bilirubin) in the morning.                           - Patient will need a KUB 2-view in 10-14 days to                            ensure pancreatic stent has migrated successfully.                            If still present at that time will need to be                            scheduled for EGD with stent pull.                           - Would hold chemical VTE prophylaxis for 48 hours                            to decrease risk of post-interventional bleeding.                            If anticoagulation is necessary consider heparin                             drip without bolus in 6-12 hours and monitor                            closely.                           - The findings and recommendations were discussed                            with the patient.                           - The findings and recommendations were discussed                            with the referring physician. Procedure Code(s):        --- Professional ---                           [phone], Endoscopic retrograde                            cholangiopancreatography (ERCP);  with placement of                            endoscopic stent into biliary or pancreatic duct,                            including pre- and post-dilation and guide wire                            passage, when performed, including sphincterotomy,                            when performed, each stent                           43264, Endoscopic retrograde                            cholangiopancreatography (ERCP); with removal of                            calculi/debris from biliary/pancreatic duct(s)                           43262, 59, Endoscopic retrograde                            cholangiopancreatography (ERCP); with                            sphincterotomy/papillotomy                           [phone], Endoscopic catheterization of the biliary                            ductal system, radiological supervision and                            interpretation Diagnosis Code(s):        --- Professional ---                           K31.89, Other diseases of stomach and duodenum                           K29.80, Duodenitis without bleeding                           R93.2, Abnormal findings on diagnostic imaging of                            liver and biliary tract                           K80.50, Calculus of bile duct without cholangitis  or cholecystitis without obstruction CPT copyright 2022 American Medical Association. All rights reserved. The codes  documented in this report are preliminary and upon coder review may  be revised to meet current compliance requirements. Yong Henle, MD 05/24/2023 5:39:27 PM Number of Addenda: 0

## 2023-05-24 NOTE — Anesthesia Procedure Notes (Signed)
 Procedure Name: Intubation Date/Time: 05/24/2023 3:50 PM  Performed by: Hebert Littler, CRNAPre-anesthesia Checklist: Patient identified, Emergency Drugs available, Suction available, Patient being monitored and Timeout performed Patient Re-evaluated:Patient Re-evaluated prior to induction Oxygen Delivery Method: Circle system utilized Preoxygenation: Pre-oxygenation with 100% oxygen Induction Type: IV induction Ventilation: Mask ventilation without difficulty and Oral airway inserted - appropriate to patient size Laryngoscope Size: Mac and 3 Grade View: Grade I Tube type: Oral Tube size: 7.0 mm Number of attempts: 1 Airway Equipment and Method: Patient positioned with wedge pillow and Stylet Placement Confirmation: ETT inserted through vocal cords under direct vision, positive ETCO2, breath sounds checked- equal and bilateral and CO2 detector Secured at: 22 cm Tube secured with: Tape

## 2023-05-24 NOTE — Interval H&P Note (Signed)
 History and Physical Interval Note:  05/24/2023 3:20 PM  Beth Rivera  has presented today for surgery, with the diagnosis of common bile duct stone.  The various methods of treatment have been discussed with the patient and family. After consideration of risks, benefits and other options for treatment, the patient has consented to  Procedure(s): ERCP, WITH INTERVENTION IF INDICATED (N/A) as a surgical intervention.  The patient's history has been reviewed, patient examined, no change in status, stable for surgery.  I have reviewed the patient's chart and labs.  Questions were answered to the patient's satisfaction.     The risks of an ERCP were discussed at length, including but not limited to the risk of perforation, bleeding, abdominal pain, post-ERCP pancreatitis (while usually mild can be severe and even life threatening).    [name]

## 2023-05-24 NOTE — Progress Notes (Signed)
To Endo via wheelchair. 

## 2023-05-24 NOTE — Progress Notes (Signed)
 Progress Note   Patient: Beth Rivera IHK:[id number] DOB: [birth year] DOA: 05/22/2023     2 DOS: the patient was seen and examined on 05/24/2023   Brief hospital course: Raiza Kiesel is a 53 y.o. female with medical history significant of IgA def, cholecystitis s/p cholecystectomy, and anxiety p/w abd pain. At OSH [facility] in [place]), labs notable for elevated AST/ALT 1000s and T bili 3.4. Pt admitted to [facility] service with GI consultation.  Assessment and Plan: Choledocholithiasis- CBD stone per MRCP. GI evaluation appreciated. Awaiting ERCP scheduled for today. Transaminitis- due to to CBD stone, trend LFT. Her nausea better, but did worsen pain with diet yesterday. NPO for ERCP today.  Hypokalemia - improved.  Anxiety and depression- Home meds resumed.      Out of bed to chair. Incentive spirometry. Nursing supportive care. Fall, aspiration precautions. Diet:  Diet Orders (From admission, onward)     Start     Ordered   05/24/23 0001  Diet NPO time specified Except for: Sips with Meds  Diet effective midnight       Question:  Except for  Answer:  Sips with Meds   05/23/23 1455           DVT prophylaxis: enoxaparin  (LOVENOX ) injection 40 mg Start: 05/25/23 1000  Level of care: Med-Surg   Code Status: Full Code  Subjective: Patient is seen and examined today morning. She is lying in bed. Pain worsened with diet yesterday. She is NPO for ERCP today.  Physical Exam: Vitals:   05/24/23 0600 05/24/23 0806 05/24/23 0810 05/24/23 1236  BP: (!) 143/94  (!) 148/82 (!) 148/98  Pulse: 75  85 91  Resp: 15  17 (!) 22  Temp: 98.4 F (36.9 C)  98.4 F (36.9 C) (!) 97 F (36.1 C)  TempSrc: Oral   Temporal  SpO2: 99% 98% 99% 98%  Weight:      Height:        General - Middle aged Caucasian female, no apparent distress HEENT - PERRLA, EOMI, atraumatic head, non tender sinuses. Lung - Clear, no rales, rhonchi, wheezes. Heart - S1, S2 heard, no murmurs, rubs, no  pedal edema. Abdomen - Soft, non tender, bowel sounds good Neuro - Alert, awake and oriented x 3, non focal exam. Skin - Warm and dry.  Data Reviewed:      Latest Ref Rng & Units 05/22/2023    3:15 PM 07/26/2022    8:26 AM 06/11/2020   10:31 AM  CBC  WBC 4.0 - 10.5 K/uL 6.5  5.4  5.3   Hemoglobin 12.0 - 15.0 g/dL 75.6  43.3  29.5   Hematocrit 36.0 - 46.0 % 39.3  41.7  37.4   Platelets 150 - 400 K/uL 334  279  349.0       Latest Ref Rng & Units 05/24/2023    7:09 AM 05/23/2023    9:25 AM 05/22/2023    3:14 PM  BMP  Glucose 70 - 99 mg/dL 85  75  76   BUN 6 - 20 mg/dL 6  6  7    Creatinine 0.44 - 1.00 mg/dL 1.88  4.16  6.06   Sodium 135 - 145 mmol/L 139  138  135   Potassium 3.5 - 5.1 mmol/L 3.7  4.1  3.4   Chloride 98 - 111 mmol/L 103  104  100   CO2 22 - 32 mmol/L 24  22  22    Calcium 8.9 - 10.3 mg/dL 9.2  9.1  9.0  MR ABDOMEN MRCP W WO CONTAST Result Date: 05/23/2023 CLINICAL DATA:  Elevated LFTs.  Bile duct dilatation. EXAM: MRI ABDOMEN WITHOUT AND WITH CONTRAST (INCLUDING MRCP) TECHNIQUE: Multiplanar multisequence MR imaging of the abdomen was performed both before and after the administration of intravenous contrast. Heavily T2-weighted images of the biliary and pancreatic ducts were obtained, and three-dimensional MRCP images were rendered by post processing. CONTRAST:  6mL GADAVIST  GADOBUTROL  1 MMOL/ML IV SOLN COMPARISON:  None FINDINGS: Lower chest: No acute findings. Hepatobiliary: Mild hepatic steatosis. No focal liver abnormality identified. Status post cholecystectomy. The common bile duct measures up to 6 mm. At the level of the distal CBD there is a stone measuring 3 by 6 mm, image 14/3, image 21/4. Pancreas: No pancreatic inflammation or main duct dilatation. No pancreatic mass identified. Spleen:  Within normal limits in size and appearance. Adrenals/Urinary Tract: No masses identified. No evidence of hydronephrosis. Stomach/Bowel: Visualized portions within the abdomen are  unremarkable. Vascular/Lymphatic: No pathologically enlarged lymph nodes identified. No abdominal aortic aneurysm demonstrated. Other:  None. Musculoskeletal: No suspicious bone lesions identified. IMPRESSION: 1. Status post cholecystectomy. 2. Upper limits of normal common bile duct with stone in the distal common bile duct measuring 3 x 6 mm. 3. Mild hepatic steatosis. Electronically Signed   By: Kimberley Penman M.D.   On: 05/23/2023 05:35   MR 3D Recon At Scanner Result Date: 05/23/2023 CLINICAL DATA:  Elevated LFTs.  Bile duct dilatation. EXAM: MRI ABDOMEN WITHOUT AND WITH CONTRAST (INCLUDING MRCP) TECHNIQUE: Multiplanar multisequence MR imaging of the abdomen was performed both before and after the administration of intravenous contrast. Heavily T2-weighted images of the biliary and pancreatic ducts were obtained, and three-dimensional MRCP images were rendered by post processing. CONTRAST:  6mL GADAVIST  GADOBUTROL  1 MMOL/ML IV SOLN COMPARISON:  None FINDINGS: Lower chest: No acute findings. Hepatobiliary: Mild hepatic steatosis. No focal liver abnormality identified. Status post cholecystectomy. The common bile duct measures up to 6 mm. At the level of the distal CBD there is a stone measuring 3 by 6 mm, image 14/3, image 21/4. Pancreas: No pancreatic inflammation or main duct dilatation. No pancreatic mass identified. Spleen:  Within normal limits in size and appearance. Adrenals/Urinary Tract: No masses identified. No evidence of hydronephrosis. Stomach/Bowel: Visualized portions within the abdomen are unremarkable. Vascular/Lymphatic: No pathologically enlarged lymph nodes identified. No abdominal aortic aneurysm demonstrated. Other:  None. Musculoskeletal: No suspicious bone lesions identified. IMPRESSION: 1. Status post cholecystectomy. 2. Upper limits of normal common bile duct with stone in the distal common bile duct measuring 3 x 6 mm. 3. Mild hepatic steatosis. Electronically Signed   By: Kimberley Penman M.D.   On: 05/23/2023 05:35    Family Communication: Discussed with patient, she understand and agree. All questions answered.  Disposition: Status is: Observation The patient remains OBS appropriate and will d/c before 2 midnights.  Planned Discharge Destination: Home pending ERCP.     Time spent: 39 minutes  Author: Aisha Hove, MD 05/24/2023 3:55 PM Secure chat 7am to 7pm For on call review [email or web address].

## 2023-05-25 ENCOUNTER — Encounter (HOSPITAL_COMMUNITY): Payer: Self-pay | Admitting: Gastroenterology

## 2023-05-25 ENCOUNTER — Telehealth: Payer: Self-pay

## 2023-05-25 ENCOUNTER — Encounter: Payer: Self-pay | Admitting: Hematology and Oncology

## 2023-05-25 ENCOUNTER — Other Ambulatory Visit (HOSPITAL_COMMUNITY): Payer: Self-pay

## 2023-05-25 DIAGNOSIS — T85528A Displacement of other gastrointestinal prosthetic devices, implants and grafts, initial encounter: Secondary | ICD-10-CM

## 2023-05-25 DIAGNOSIS — Z9889 Other specified postprocedural states: Secondary | ICD-10-CM

## 2023-05-25 DIAGNOSIS — Z9049 Acquired absence of other specified parts of digestive tract: Secondary | ICD-10-CM

## 2023-05-25 DIAGNOSIS — K2970 Gastritis, unspecified, without bleeding: Secondary | ICD-10-CM

## 2023-05-25 DIAGNOSIS — K8050 Calculus of bile duct without cholangitis or cholecystitis without obstruction: Secondary | ICD-10-CM | POA: Diagnosis not present

## 2023-05-25 LAB — COMPREHENSIVE METABOLIC PANEL WITH GFR
ALT: 487 U/L — ABNORMAL HIGH (ref 0–44)
AST: 215 U/L — ABNORMAL HIGH (ref 15–41)
Albumin: 3.5 g/dL (ref 3.5–5.0)
Alkaline Phosphatase: 275 U/L — ABNORMAL HIGH (ref 38–126)
Anion gap: 8 (ref 5–15)
BUN: 7 mg/dL (ref 6–20)
CO2: 28 mmol/L (ref 22–32)
Calcium: 9.2 mg/dL (ref 8.9–10.3)
Chloride: 100 mmol/L (ref 98–111)
Creatinine, Ser: 0.84 mg/dL (ref 0.44–1.00)
GFR, Estimated: >60 mL/min (ref >60)
Glucose, Bld: 84 mg/dL (ref 70–99)
Potassium: 4.4 mmol/L (ref 3.5–5.1)
Sodium: 136 mmol/L (ref 135–145)
Total Bilirubin: 1.2 mg/dL (ref 0.0–1.2)
Total Protein: 6.9 g/dL (ref 6.5–8.1)

## 2023-05-25 MED ORDER — PANTOPRAZOLE SODIUM 40 MG PO TBEC
40.0000 mg | DELAYED_RELEASE_TABLET | Freq: Every day | ORAL | 0 refills | Status: DC
  Filled 2023-05-25: qty 30, 30d supply, fill #0

## 2023-05-25 NOTE — Telephone Encounter (Signed)
-----   Message from [facility] sent at 05/24/2023  5:45 PM EDT ----- Regarding: Pancreatic Stent Followup Beth Rivera, Patient needs KUB in 2-weeks for PD stent followup. Thanks. GM

## 2023-05-25 NOTE — Plan of Care (Signed)

## 2023-05-25 NOTE — Progress Notes (Signed)
 [facility] ROUNDING NOTE   Subjective: Patient feeling well this morning.  Denies any abdominal pain, nausea or vomiting.  Just received breakfast tray, but has not eaten.  Liver enzymes downtrending, T. bili normal.   Objective: Vital signs in last 24 hours: Temp:  [97 F (36.1 C)-98.6 F (37 C)] 98.5 F (36.9 C) (05/21 0751) Pulse Rate:  [72-91] 72 (05/21 0751) Resp:  [9-22] 18 (05/21 0751) BP: (110-148)/(78-98) 123/86 (05/21 0751) SpO2:  [95 %-100 %] 98 % (05/21 0751) Last BM Date : 05/20/23 General: NAD, pleasant Caucasian female Lungs:  CTA b/l, no w/r/r Heart:  RRR, no m/r/g Abdomen:  Soft, NT, ND, +BS Ext:  No c/c/e    Intake/Output from previous day: 05/20 0701 - 05/21 0700 In: 900 [I.V.:800; IV Piggyback:100] Out: -  Intake/Output this shift: No intake/output data recorded.   Lab Results: Recent Labs    05/22/23 1515  WBC 6.5  HGB 13.3  PLT 334  MCV 87.5   BMET Recent Labs    05/23/23 0925 05/24/23 0709 05/25/23 0609  NA 138 139 136  K 4.1 3.7 4.4  CL 104 103 100  CO2 22 24 28   GLUCOSE 75 85 84  BUN 6 6 7   CREATININE 0.93 0.77 0.84  CALCIUM 9.1 9.2 9.2   LFT Recent Labs    05/23/23 0925 05/24/23 0709 05/25/23 0609  PROT 7.3 7.0 6.9  ALBUMIN 3.8 3.7 3.5  AST 270* 242* 215*  ALT 609* 533* 487*  ALKPHOS 284* 256* 275*  BILITOT 1.6* 1.4* 1.2   PT/INR Recent Labs    05/22/23 1704  INR 0.9      Imaging/Other results: No results found.    Assessment and Plan:  53 year old female with remote history of cholecystectomy, admitted with symptomatic choledocholithiasis.  Underwent technically difficult ERCP yesterday with sphincterotomy and balloon extraction of common duct stones, pancreatic duct stent placed.  She is feeling well today, no concerns for post ERCP pancreatitis.  Choledocholithiasis status post ERCP -Okay for discharge home from GI standpoint - Resume regular diet - Patient will be scheduled for KUB  in 10-14 days to ensure migration of pancreatic duct stent - Recommend 2-4-week course of pantoprazole for gastritis/duodenitis   Elois Hair, MD  05/25/2023, 10:52 AM [facility]

## 2023-05-25 NOTE — Discharge Instructions (Addendum)
 Beth Rivera,  You were in the hospital with a blockage in your biliary system. This was fixed by the GI doctor and you had a stent placed in your pancreatic duct. Please follow-up with your PCP for repeat lab work and follow-up with the GI doctor for repeat x-ray and, if necessary, removal of the stent.

## 2023-05-25 NOTE — Hospital Course (Signed)
 Beth Rivera is a 53 y.o. female with a history of IgA deficiency, cholecystitis status post cholecystectomy, anxiety, depression.  Patient presented secondary to abdominal pain was found to have evidence of choledocholithiasis.  GI consulted and performed an ERCP with evidence of a filling defect consistent with sludge which was removed.  Pancreatic stent was placed patient improved prior to discharge.

## 2023-05-25 NOTE — Discharge Summary (Signed)
 Physician Discharge Summary   Patient: Beth Rivera MRN: [id number] DOB: Apr 24, 1970  Admit date:     05/22/2023  Discharge date: 05/25/23  Discharge Physician: Aneita Keens, MD   PCP: Aida House, MD   Recommendations at discharge:  PCP visit for hospital follow-up in addition to repeat CMP GI follow-up in 2 to 4 weeks for repeat x-ray imaging to ensure passage of pancreatic stent  Discharge Diagnoses: Principal Problem:   Choledocholithiasis Active Problems:   Gastritis and gastroduodenitis   H/O insertion of pancreatic stent  Resolved Problems:   * No resolved hospital problems. *  Hospital Course: Beth Rivera is a 53 y.o. female with a history of IgA deficiency, cholecystitis status post cholecystectomy, anxiety, depression.  Patient presented secondary to abdominal pain was found to have evidence of choledocholithiasis.  GI consulted and performed an ERCP with evidence of a filling defect consistent with sludge which was removed.  Pancreatic stent was placed patient improved prior to discharge.  Assessment and Plan:  Choledocholithiasis Patient status post cholecystectomy.  Patient with elevated clean phosphatase, AST, ALT, bilirubin.  GI was consulted.  MRCP significant for located in the distal common bile duct measuring 3 x 6 mm.  ERCP was performed on 5/20 removal of stones/sludge in addition to placement of pancreatic stent.  Duodenitis Noted on ERCP.  Patient started on Protonix recommendation for Protonix x 4 weeks.  Hypokalemia Mild.  Resolved with supplementation.  Anxiety Depression Continue Cymbalta  and trazodone .   Consultants: [facility] Procedures performed: ERCP Disposition: Home Diet recommendation: Regular diet   DISCHARGE MEDICATION: Allergies as of 05/25/2023       Reactions   Other    Seasonal, dogs and mold cause mucus and sinus issues        Medication List     STOP taking these medications     busPIRone 5 MG tablet Commonly known as: BUSPAR   norethindrone -ethinyl estradiol  1-20 MG-MCG tablet Commonly known as: Loestrin 1/20 (21)       TAKE these medications    amphetamine -dextroamphetamine  10 MG tablet Commonly known as: Adderall 1 or 2 daily as needed for sleepiness   cetirizine 10 MG tablet Commonly known as: ZYRTEC Take 1 tablet by mouth daily.   cyclobenzaprine  10 MG tablet Commonly known as: FLEXERIL  Take 1 tablet (10 mg total) by mouth at bedtime as needed for muscle spasms.   DULoxetine  60 MG capsule Commonly known as: CYMBALTA  Take 120 mg by mouth daily.   fluticasone -salmeterol 250-50 MCG/ACT Aepb Commonly known as: ADVAIR Inhale 1 puff into the lungs in the morning and at bedtime.   Otezla  30 MG Tabs Generic drug: Apremilast  Take 1 tablet by mouth 2 (two) times daily.   pantoprazole 40 MG tablet Commonly known as: PROTONIX Take 1 tablet (40 mg total) by mouth daily. Start taking on: May 26, 2023   traZODone  150 MG tablet Commonly known as: DESYREL  Take 150 mg by mouth at bedtime.        Follow-up Information     Aida House, MD. Schedule an appointment as soon as possible for a visit in 2 week(s).   Specialty: Family Medicine Why: For hospital follow-up. Repeat complete metabolic panel. Contact information: [address] [place] [address] [phone]         [facility] Follow up.   Specialty: Gastroenterology Contact information: [address] [place]  [address] [phone]  Discharge Exam: BP 123/86 (BP Location: Right Arm)   Pulse 72   Temp 98.5 F (36.9 C) (Oral)   Resp 18   Ht 5\' 6"  (1.676 m)   Wt 60.9 kg   SpO2 98%   BMI 21.68 kg/m   General exam: Appears calm and comfortable Respiratory system: Clear to auscultation. Respiratory effort normal. Cardiovascular system: S1 & S2 heard, RRR. No murmurs, rubs, gallops or  clicks. Gastrointestinal system: Abdomen is nondistended, soft and nontender. Normal bowel sounds heard. Central nervous system: Alert and oriented. No focal neurological deficits. Musculoskeletal: No edema. No calf tenderness Psychiatry: Judgement and insight appear normal. Mood & affect appropriate.   Condition at discharge: stable  The results of significant diagnostics from this hospitalization (including imaging, microbiology, ancillary and laboratory) are listed below for reference.   Imaging Studies: MR ABDOMEN MRCP W WO CONTAST Result Date: 05/23/2023 CLINICAL DATA:  Elevated LFTs.  Bile duct dilatation. EXAM: MRI ABDOMEN WITHOUT AND WITH CONTRAST (INCLUDING MRCP) TECHNIQUE: Multiplanar multisequence MR imaging of the abdomen was performed both before and after the administration of intravenous contrast. Heavily T2-weighted images of the biliary and pancreatic ducts were obtained, and three-dimensional MRCP images were rendered by post processing. CONTRAST:  6mL GADAVIST  GADOBUTROL  1 MMOL/ML IV SOLN COMPARISON:  None FINDINGS: Lower chest: No acute findings. Hepatobiliary: Mild hepatic steatosis. No focal liver abnormality identified. Status post cholecystectomy. The common bile duct measures up to 6 mm. At the level of the distal CBD there is a stone measuring 3 by 6 mm, image 14/3, image 21/4. Pancreas: No pancreatic inflammation or main duct dilatation. No pancreatic mass identified. Spleen:  Within normal limits in size and appearance. Adrenals/Urinary Tract: No masses identified. No evidence of hydronephrosis. Stomach/Bowel: Visualized portions within the abdomen are unremarkable. Vascular/Lymphatic: No pathologically enlarged lymph nodes identified. No abdominal aortic aneurysm demonstrated. Other:  None. Musculoskeletal: No suspicious bone lesions identified. IMPRESSION: 1. Status post cholecystectomy. 2. Upper limits of normal common bile duct with stone in the distal common bile duct  measuring 3 x 6 mm. 3. Mild hepatic steatosis. Electronically Signed   By: Kimberley Penman M.D.   On: 05/23/2023 05:35   MR 3D Recon At Scanner Result Date: 05/23/2023 CLINICAL DATA:  Elevated LFTs.  Bile duct dilatation. EXAM: MRI ABDOMEN WITHOUT AND WITH CONTRAST (INCLUDING MRCP) TECHNIQUE: Multiplanar multisequence MR imaging of the abdomen was performed both before and after the administration of intravenous contrast. Heavily T2-weighted images of the biliary and pancreatic ducts were obtained, and three-dimensional MRCP images were rendered by post processing. CONTRAST:  6mL GADAVIST  GADOBUTROL  1 MMOL/ML IV SOLN COMPARISON:  None FINDINGS: Lower chest: No acute findings. Hepatobiliary: Mild hepatic steatosis. No focal liver abnormality identified. Status post cholecystectomy. The common bile duct measures up to 6 mm. At the level of the distal CBD there is a stone measuring 3 by 6 mm, image 14/3, image 21/4. Pancreas: No pancreatic inflammation or main duct dilatation. No pancreatic mass identified. Spleen:  Within normal limits in size and appearance. Adrenals/Urinary Tract: No masses identified. No evidence of hydronephrosis. Stomach/Bowel: Visualized portions within the abdomen are unremarkable. Vascular/Lymphatic: No pathologically enlarged lymph nodes identified. No abdominal aortic aneurysm demonstrated. Other:  None. Musculoskeletal: No suspicious bone lesions identified. IMPRESSION: 1. Status post cholecystectomy. 2. Upper limits of normal common bile duct with stone in the distal common bile duct measuring 3 x 6 mm. 3. Mild hepatic steatosis. Electronically Signed   By: Kimberley Penman M.D.   On: 05/23/2023  05:35    Microbiology: Results for orders placed or performed during the hospital encounter of 06/26/18  Novel Coronavirus,NAA,(SEND-OUT TO REF LAB - TAT 24-48 hrs); Hosp Order     Status: None   Collection Time: 06/26/18  1:14 PM   Specimen: Nasopharyngeal Swab; Respiratory  Result Value  Ref Range Status   SARS-CoV-2, NAA NOT DETECTED NOT DETECTED Final    Comment: (NOTE) This test was developed and its performance characteristics determined by [employer]. This test has not been FDA cleared or approved. This test has been authorized by FDA under an Emergency Use Authorization (EUA). This test is only authorized for the duration of time the declaration that circumstances exist justifying the authorization of the emergency use of in vitro diagnostic tests for detection of SARS-CoV-2 virus and/or diagnosis of COVID-19 infection under section 564(b)(1) of the Act, 21 U.S.C. 865HQI-6(N)(6), unless the authorization is terminated or revoked sooner. When diagnostic testing is negative, the possibility of a false negative result should be considered in the context of a patient's recent exposures and the presence of clinical signs and symptoms consistent with COVID-19. An individual without symptoms of COVID-19 and who is not shedding SARS-CoV-2 virus would expect to have a negative (not detected) result in this assay. Performed  At: [facility] [address] [place], [place] [id number] Pearlean Botts MD GM:[id number]    Coronavirus Source NASOPHARYNGEAL  Final    Comment: Performed at [facility], [address] [place], [place] [address]    Labs: CBC: Recent Labs  Lab 05/22/23 1515  WBC 6.5  HGB 13.3  HCT 39.3  MCV 87.5  PLT 334   Basic Metabolic Panel: Recent Labs  Lab 05/22/23 1514 05/23/23 0925 05/24/23 0709 05/25/23 0609  NA 135 138 139 136  K 3.4* 4.1 3.7 4.4  CL 100 104 103 100  CO2 22 22 24 28   GLUCOSE 76 75 85 84  BUN 7 6 6 7   CREATININE 0.84 0.93 0.77 0.84  CALCIUM 9.0 9.1 9.2 9.2  MG 1.9  --   --   --    Liver Function Tests: Recent Labs  Lab 05/22/23 1514 05/23/23 0925 05/24/23 0709 05/25/23 0609  AST 486* 270* 242* 215*  ALT 744* 609* 533* 487*  ALKPHOS 262* 284* 256* 275*  BILITOT 4.3* 1.6*  1.4* 1.2  PROT 7.0 7.3 7.0 6.9  ALBUMIN 3.8 3.8 3.7 3.5    Discharge time spent: 35 minutes.  Signed: Aneita Keens, MD [facility] 05/25/2023

## 2023-05-25 NOTE — Telephone Encounter (Signed)
 Order entered and pt to be called in 2 weeks

## 2023-05-26 ENCOUNTER — Telehealth: Payer: Self-pay

## 2023-05-26 LAB — SURGICAL PATHOLOGY

## 2023-05-26 NOTE — Anesthesia Postprocedure Evaluation (Signed)
 Anesthesia Post Note  Patient: Beth Rivera  Procedure(s) Performed: ERCP, WITH INTERVENTION IF INDICATED     Patient location during evaluation: PACU Anesthesia Type: General Level of consciousness: awake and alert Pain management: pain level controlled Vital Signs Assessment: post-procedure vital signs reviewed and stable Respiratory status: spontaneous breathing, nonlabored ventilation, respiratory function stable and patient connected to nasal cannula oxygen Cardiovascular status: blood pressure returned to baseline and stable Postop Assessment: no apparent nausea or vomiting Anesthetic complications: no  No notable events documented.  Last Vitals:  Vitals:   05/25/23 0746 05/25/23 0751  BP:  123/86  Pulse: 82 72  Resp: 16 18  Temp:  36.9 C  SpO2: 98% 98%    Last Pain:  Vitals:   05/25/23 0900  TempSrc:   PainSc: 0-No pain                 Lavonne Kinderman L Tonna Palazzi

## 2023-05-26 NOTE — Transitions of Care (Post Inpatient/ED Visit) (Unsigned)
   05/26/2023  Name: Marinda Tyer [facility] MRN: [id number] DOB: December 21, 1970  Today's TOC FU Call Status: Today's TOC FU Call Status:: Unsuccessful Call (1st Attempt) Unsuccessful Call (1st Attempt) Date: 05/26/23  Attempted to reach the patient regarding the most recent Inpatient/ED visit.  Follow Up Plan: Additional outreach attempts will be made to reach the patient to complete the Transitions of Care (Post Inpatient/ED visit) call.   Signature Darrall Ellison, LPN [facility] Nurse Health Advisor Direct Dial [phone]

## 2023-05-27 ENCOUNTER — Other Ambulatory Visit: Payer: Self-pay

## 2023-05-27 ENCOUNTER — Ambulatory Visit: Payer: Self-pay | Admitting: Gastroenterology

## 2023-05-27 ENCOUNTER — Telehealth: Admitting: Emergency Medicine

## 2023-05-27 DIAGNOSIS — R1084 Generalized abdominal pain: Secondary | ICD-10-CM

## 2023-05-27 DIAGNOSIS — A048 Other specified bacterial intestinal infections: Secondary | ICD-10-CM

## 2023-05-27 DIAGNOSIS — M549 Dorsalgia, unspecified: Secondary | ICD-10-CM

## 2023-05-27 DIAGNOSIS — T85528A Displacement of other gastrointestinal prosthetic devices, implants and grafts, initial encounter: Secondary | ICD-10-CM

## 2023-05-27 MED ORDER — TALICIA 250-12.5-10 MG PO CPDR: 4.0000 | DELAYED_RELEASE_CAPSULE | Freq: Three times a day (TID) | ORAL | 0 refills | Status: DC

## 2023-05-27 NOTE — Progress Notes (Signed)
 This condition requires management by your GI doctor and is outside our scope of practice for e-visits.  Pease call your GI doctor or your PCP or return to the ER if your symptoms are severe.  NOTE: You will NOT be charged for this eVisit.  If you do not have a PCP, [facility] offers a free physician referral service available at [phone]. Our trained staff has the experience, knowledge and resources to put you in touch with a physician who is right for you.    If you are having a true medical emergency please call 911.   Your e-visit answers were reviewed by a board certified advanced clinical practitioner to complete your personal care plan.  Thank you for using e-Visits.

## 2023-05-31 NOTE — Transitions of Care (Post Inpatient/ED Visit) (Unsigned)
   05/31/2023  Name: Beth Rivera [facility] MRN: [id number] DOB: [birth year]  Today's TOC FU Call Status: Today's TOC FU Call Status:: Unsuccessful Call (2nd Attempt) Unsuccessful Call (1st Attempt) Date: 05/26/23 Unsuccessful Call (2nd Attempt) Date: 05/31/23  Attempted to reach the patient regarding the most recent Inpatient/ED visit.  Follow Up Plan: Additional outreach attempts will be made to reach the patient to complete the Transitions of Care (Post Inpatient/ED visit) call.   Signature Darrall Ellison, LPN [facility] Nurse Health Advisor Direct Dial [phone]

## 2023-06-01 NOTE — Transitions of Care (Post Inpatient/ED Visit) (Signed)
   06/01/2023  Name: Beth Rivera MRN: [id number] DOB: 1970-08-30  Today's TOC FU Call Status: Today's TOC FU Call Status:: Successful TOC FU Call Completed Unsuccessful Call (1st Attempt) Date: 05/26/23 Unsuccessful Call (2nd Attempt) Date: 05/31/23 [facility] FU Call Complete Date: 06/01/23 Patient's Name and Date of Birth confirmed.  Transition Care Management Follow-up Telephone Call Date of Discharge: 05/25/23 Discharge Facility: [facility]) Type of Discharge: Inpatient Admission Primary Inpatient Discharge Diagnosis:: cholecystitis How have you been since you were released from the hospital?: Better Any questions or concerns?: No  Items Reviewed: Did you receive and understand the discharge instructions provided?: Yes Medications obtained,verified, and reconciled?: Yes (Medications Reviewed) Any new allergies since your discharge?: No Dietary orders reviewed?: Yes  Medications Reviewed Today: Medications Reviewed Today     Reviewed by Darrall Ellison, LPN (Licensed Practical Nurse) on 06/01/23 at 1141  Med List Status: <None>   Medication Order Taking? Sig Documenting Provider Last Dose Status Informant  Amoxicill-Rifabutin-Omeprazole (TALICIA) 250-12.5-10 MG CPDR [id number]  Take 4 capsules by mouth in the morning, at noon, and at bedtime. Mansouraty, Albino Alu., MD  Active   amphetamine -dextroamphetamine  (ADDERALL) 10 MG tablet [id number] No 1 or 2 daily as needed for sleepiness Faustina Hood, MD 05/21/2023 Active Self, Pharmacy Records  cetirizine (ZYRTEC) 10 MG tablet [id number] No Take 1 tablet by mouth daily. [provider] 05/21/2023 Active Self, Pharmacy Records  cyclobenzaprine  (FLEXERIL ) 10 MG tablet [id number] No Take 1 tablet (10 mg total) by mouth at bedtime as needed for muscle spasms. Patel, Donika K, DO Past Week Active Self, Pharmacy Records  DULoxetine  (CYMBALTA ) 60 MG capsule [id number] No Take 120 mg by mouth daily. [provider]  05/21/2023 Active Self, Pharmacy Records  fluticasone -salmeterol (ADVAIR) 250-50 MCG/ACT AEPB [id number] No Inhale 1 puff into the lungs in the morning and at bedtime. Aida House, MD 05/21/2023 Active Self, Pharmacy Records           Med Note Cornelia Dieter, Arther Bill May 22, 2023  2:53 PM) Patient reports she frequently forgets to take the second dose of this medication  OTEZLA  30 MG TABS [id number] No Take 1 tablet by mouth 2 (two) times daily. [provider] 05/21/2023 Active Self, Pharmacy Records  pantoprazole (PROTONIX) 40 MG tablet [id number]  Take 1 tablet (40 mg total) by mouth daily. Verlyn Goad, MD  Active   traZODone  (DESYREL ) 150 MG tablet [id number] No Take 150 mg by mouth at bedtime. [provider] 05/21/2023 Active Self, Pharmacy Records            Home Care and Equipment/Supplies: Were Home Health Services Ordered?: NA Any new equipment or medical supplies ordered?: NA  Functional Questionnaire: Do you need assistance with bathing/showering or dressing?: No Do you need assistance with meal preparation?: No Do you need assistance with eating?: No Do you have difficulty maintaining continence: No Do you need assistance with getting out of bed/getting out of a chair/moving?: No Do you have difficulty managing or taking your medications?: No  Follow up appointments reviewed: PCP Follow-up appointment confirmed?: No (sent message to staff to schedule) MD Provider Line Number:[phone] Given: No Specialist Hospital Follow-up appointment confirmed?: No Reason Specialist Follow-Up Not Confirmed: Patient has Specialist Provider Number and will Call for Appointment Do you need transportation to your follow-up appointment?: No Do you understand care options if your condition(s) worsen?: Yes-patient verbalized understanding    SIGNATURE Darrall Ellison, LPN [facility] Nurse Health Advisor Direct Dial [phone]

## 2023-06-02 ENCOUNTER — Encounter: Payer: Self-pay | Admitting: Family Medicine

## 2023-06-02 ENCOUNTER — Ambulatory Visit: Admitting: Family Medicine

## 2023-06-03 ENCOUNTER — Encounter: Payer: Self-pay | Admitting: Gastroenterology

## 2023-06-03 NOTE — Telephone Encounter (Signed)
 See additional patient note from 06/03/23.

## 2023-06-03 NOTE — Telephone Encounter (Signed)
 Looks like Dr. Rexie Catena ordered these tests for her already-- should she contact his office instead?

## 2023-06-04 ENCOUNTER — Encounter: Payer: Self-pay | Admitting: Family Medicine

## 2023-06-04 DIAGNOSIS — K8001 Calculus of gallbladder with acute cholecystitis with obstruction: Secondary | ICD-10-CM

## 2023-06-04 MED ORDER — OMEPRAZOLE 40 MG PO CPDR: 40.0000 mg | DELAYED_RELEASE_CAPSULE | Freq: Every day | ORAL | 3 refills | Status: DC

## 2023-06-04 MED ORDER — CLARITHROMYCIN 500 MG PO TABS: 500.0000 mg | ORAL_TABLET | Freq: Two times a day (BID) | ORAL | 0 refills | Status: AC

## 2023-06-04 MED ORDER — AMOXICILLIN 500 MG PO CAPS: 1000.0000 mg | ORAL_CAPSULE | Freq: Two times a day (BID) | ORAL | 0 refills | Status: AC

## 2023-06-07 ENCOUNTER — Inpatient Hospital Stay: Admitting: Family Medicine

## 2023-06-17 ENCOUNTER — Other Ambulatory Visit (INDEPENDENT_AMBULATORY_CARE_PROVIDER_SITE_OTHER)

## 2023-06-17 ENCOUNTER — Ambulatory Visit (INDEPENDENT_AMBULATORY_CARE_PROVIDER_SITE_OTHER)
Admission: RE | Admit: 2023-06-17 | Discharge: 2023-06-17 | Disposition: A | Discharge status: Home / Self Care | Source: Ambulatory Visit | Attending: Gastroenterology | Admitting: Gastroenterology

## 2023-06-17 ENCOUNTER — Ambulatory Visit: Payer: Self-pay | Admitting: Gastroenterology

## 2023-06-17 DIAGNOSIS — A048 Other specified bacterial intestinal infections: Secondary | ICD-10-CM

## 2023-06-17 DIAGNOSIS — T85528A Displacement of other gastrointestinal prosthetic devices, implants and grafts, initial encounter: Secondary | ICD-10-CM

## 2023-06-17 DIAGNOSIS — Z48815 Encounter for surgical aftercare following surgery on the digestive system: Secondary | ICD-10-CM

## 2023-06-17 LAB — HEPATIC FUNCTION PANEL
ALT: 18 U/L (ref 0–35)
AST: 23 U/L (ref 0–37)
Albumin: 4.3 g/dL (ref 3.5–5.2)
Alkaline Phosphatase: 87 U/L (ref 39–117)
Bilirubin, Direct: 0.1 mg/dL (ref 0.0–0.3)
Total Bilirubin: 0.4 mg/dL (ref 0.2–1.2)
Total Protein: 7.3 g/dL (ref 6.0–8.3)

## 2023-06-18 ENCOUNTER — Ambulatory Visit: Payer: Self-pay | Admitting: Gastroenterology

## 2023-06-18 LAB — COMPREHENSIVE METABOLIC PANEL WITH GFR
ALT: 20 IU/L (ref 0–32)
AST: 25 IU/L (ref 0–40)
Albumin: 4.2 g/dL (ref 3.8–4.9)
Alkaline Phosphatase: 103 IU/L (ref 44–121)
BUN/Creatinine Ratio: 10 (ref 9–23)
BUN: 9 mg/dL (ref 6–24)
Bilirubin Total: 0.3 mg/dL (ref 0.0–1.2)
CO2: 24 mmol/L (ref 20–29)
Calcium: 9.6 mg/dL (ref 8.7–10.2)
Chloride: 101 mmol/L (ref 96–106)
Creatinine, Ser: 0.88 mg/dL (ref 0.57–1.00)
Globulin, Total: 2.7 g/dL (ref 1.5–4.5)
Glucose: 76 mg/dL (ref 70–99)
Potassium: 4.4 mmol/L (ref 3.5–5.2)
Sodium: 141 mmol/L (ref 134–144)
Total Protein: 6.9 g/dL (ref 6.0–8.5)
eGFR: 80 mL/min/{1.73_m2} (ref >59)

## 2023-06-27 ENCOUNTER — Ambulatory Visit: Payer: Self-pay | Admitting: Gastroenterology

## 2023-07-01 ENCOUNTER — Ambulatory Visit (INDEPENDENT_AMBULATORY_CARE_PROVIDER_SITE_OTHER): Admitting: Family Medicine

## 2023-07-01 ENCOUNTER — Encounter: Payer: Self-pay | Admitting: Family Medicine

## 2023-07-01 VITALS — BP 104/70 | HR 76 | Temp 98.3°F | Ht 66.0 in | Wt 135.5 lb

## 2023-07-01 DIAGNOSIS — J4489 Other specified chronic obstructive pulmonary disease: Secondary | ICD-10-CM

## 2023-07-01 DIAGNOSIS — J029 Acute pharyngitis, unspecified: Secondary | ICD-10-CM

## 2023-07-01 LAB — POCT RAPID STREP A (OFFICE): Rapid Strep A Screen: NEGATIVE

## 2023-07-01 MED ORDER — METHYLPREDNISOLONE 4 MG PO TBPK: ORAL_TABLET | ORAL | 0 refills | Status: DC

## 2023-07-01 MED ORDER — DOXYCYCLINE HYCLATE 100 MG PO TABS: 100.0000 mg | ORAL_TABLET | Freq: Two times a day (BID) | ORAL | 0 refills | Status: AC

## 2023-07-01 NOTE — Progress Notes (Unsigned)
 Established Patient Office Visit  Subjective   Patient ID: Beth Rivera, female    DOB: 04/15/1970  Age: 53 y.o. MRN: [id number]  Chief Complaint  Patient presents with   Jaw Pain    Patient complains of left jaw pain, feeling og fiberglass in the throat x3 days, denies sore throat and states she has had an URI    Pt is here for follow up today after having an acute illness. Had been diagnosed with H pylori and had to complete a prolonged course of antibiotics. No symptoms up until about 4 days ago, started having sore throat, scratchy, stinging, no sx of acid reflux. Then she went to [place], when she returned she had URI type symptoms, that was about 2 weeks ago, states that her husband was also sick. Since then has a residual cough but the URI symptoms are essentially gone. Left side of neck and jaw are tender/feels swollen, there is pain with swallowing as well. No fever or chills, no difficulty breathing. States that there is some ear pain on the left side, maybe some dizziness as well. Covid test at home was negative.    Current Outpatient Medications  Medication Instructions   amphetamine -dextroamphetamine  (ADDERALL) 10 MG tablet 1 or 2 daily as needed for sleepiness   cetirizine (ZYRTEC) 10 MG tablet 1 tablet, Daily   cyclobenzaprine  (FLEXERIL ) 10 mg, Oral, At bedtime PRN   doxycycline (VIBRA-TABS) 100 mg, Oral, 2 times daily   DULoxetine  (CYMBALTA ) 120 mg, Daily   fluticasone -salmeterol (ADVAIR) 250-50 MCG/ACT AEPB 1 puff, Inhalation, 2 times daily   methylPREDNISolone  (MEDROL  DOSEPAK) 4 MG TBPK tablet Take package as directed.   omeprazole  (PRILOSEC) 40 mg, Oral, Daily   OTEZLA  30 MG TABS 1 tablet, 2 times daily   traZODone  (DESYREL ) 150 mg, Daily at bedtime    Patient Active Problem List   Diagnosis Date Noted   Gastritis and gastroduodenitis 05/25/2023   H/O insertion of pancreatic stent 05/25/2023   Choledocholithiasis 05/22/2023   Perimenopausal disorder  03/15/2022   IDA (iron  deficiency anemia) 01/23/2022   Anemia 12/15/2018   Neck pain 10/20/2017   Pelvic swelling, RLQ 09/07/2017   Asthmatic bronchitis , chronic (HCC) 06/28/2017   Immunodeficiency, unspecified (HCC) 06/28/2017   Unexplained weight loss 06/28/2017   Psoriasis 06/28/2017   IgA deficiency (HCC) 03/27/2017   Other allergic rhinitis 03/27/2017   Trigeminal neuralgia 03/11/2017   Chronic left shoulder pain 02/03/2017   Seasonal affective disorder (HCC) 12/11/2015   Hypersomnia 12/11/2015   Non-intractable vomiting with nausea 12/11/2015   Globus pharyngeus 12/11/2015   Cholelithiasis with chronic cholecystitis 02/02/2015      Review of Systems  Reason unable to perform ROS: ss HPI above.  All other systems reviewed and are negative.     Objective:     BP 104/70   Pulse 76   Temp 98.3 F (36.8 C) (Oral)   Ht 5' 6 (1.676 m)   Wt 135 lb 8 oz (61.5 kg)   LMP 06/03/2023   SpO2 97%   BMI 21.87 kg/m    Physical Exam Vitals reviewed.  Constitutional:      Appearance: Normal appearance. She is normal weight.  HENT:     Right Ear: Tympanic membrane normal.     Left Ear: Tympanic membrane normal.     Nose: Nose normal. No rhinorrhea.     Mouth/Throat:     Mouth: Mucous membranes are moist.     Pharynx: Posterior oropharyngeal erythema present.  Cardiovascular:     Rate and Rhythm: Normal rate and regular rhythm.     Pulses: Normal pulses.  Pulmonary:     Effort: Pulmonary effort is normal.     Breath sounds: Wheezing (inspiratory and expiratory wheezing in all lung fields) present.   Musculoskeletal:     Cervical back: Tenderness (left side under the jawline) present. No rigidity.  Lymphadenopathy:     Cervical: No cervical adenopathy.   Neurological:     Mental Status: She is alert.     Results for orders placed or performed in visit on 07/01/23  POC Rapid Strep A  Result Value Ref Range   Rapid Strep A Screen Negative Negative       The 10-year ASCVD risk score (Arnett DK, et al., 2019) is: 0.9%    Assessment & Plan:  Asthmatic bronchitis , chronic (HCC) -     methylPREDNISolone ; Take package as directed.  Dispense: 21 each; Refill: 0 -     Doxycycline Hyclate; Take 1 tablet (100 mg total) by mouth 2 (two) times daily for 7 days.  Dispense: 14 tablet; Refill: 0  Sore throat -     POCT rapid strep A   She is wheezing excessively today, strep negative. Will treat with steroids and empiric abx therapy despite the negative strep test. Pt is to let me know if her symptoms persist after treatment.   No follow-ups on file.    Heron CHRISTELLA Sharper, MD

## 2023-07-27 ENCOUNTER — Telehealth: Payer: Self-pay

## 2023-07-27 NOTE — Telephone Encounter (Signed)
-----   Message from Nurse Leshay Desaulniers P sent at 05/27/2023 10:14 AM EDT ----- Pt needs diatherix

## 2023-07-27 NOTE — Telephone Encounter (Signed)
 Pt has f/u with Dr Wilhelmenia to establish care since the retirement of Dr Aneita

## 2023-07-28 ENCOUNTER — Inpatient Hospital Stay
Payer: No Typology Code available for payment source | Attending: Hematology and Oncology | Admitting: Hematology and Oncology

## 2023-07-28 ENCOUNTER — Inpatient Hospital Stay

## 2023-07-28 VITALS — BP 121/87 | HR 73 | Temp 98.8°F | Resp 18 | Wt 134.1 lb

## 2023-07-28 DIAGNOSIS — D509 Iron deficiency anemia, unspecified: Secondary | ICD-10-CM | POA: Insufficient documentation

## 2023-07-28 DIAGNOSIS — Z79899 Other long term (current) drug therapy: Secondary | ICD-10-CM | POA: Diagnosis not present

## 2023-07-28 LAB — CBC WITH DIFFERENTIAL/PLATELET
Abs Immature Granulocytes: 0.01 K/uL (ref 0.00–0.07)
Basophils Absolute: 0 K/uL (ref 0.0–0.1)
Basophils Relative: 1 %
Eosinophils Absolute: 0.1 K/uL (ref 0.0–0.5)
Eosinophils Relative: 2 %
HCT: 40 % (ref 36.0–46.0)
Hemoglobin: 13.2 g/dL (ref 12.0–15.0)
Immature Granulocytes: 0 %
Lymphocytes Relative: 46 %
Lymphs Abs: 2.6 K/uL (ref 0.7–4.0)
MCH: 29.3 pg (ref 26.0–34.0)
MCHC: 33 g/dL (ref 30.0–36.0)
MCV: 88.7 fL (ref 80.0–100.0)
Monocytes Absolute: 0.4 K/uL (ref 0.1–1.0)
Monocytes Relative: 7 %
Neutro Abs: 2.5 K/uL (ref 1.7–7.7)
Neutrophils Relative %: 44 %
Platelets: 315 K/uL (ref 150–400)
RBC: 4.51 MIL/uL (ref 3.87–5.11)
RDW: 13.6 % (ref 11.5–15.5)
WBC: 5.8 K/uL (ref 4.0–10.5)
nRBC: 0 % (ref 0.0–0.2)

## 2023-07-28 LAB — IRON AND IRON BINDING CAPACITY (CC-WL,HP ONLY)
Iron: 56 ug/dL (ref 28–170)
Saturation Ratios: 14 % (ref 10.4–31.8)
TIBC: 405 ug/dL (ref 250–450)
UIBC: 349 ug/dL (ref 148–442)

## 2023-07-28 LAB — FERRITIN: Ferritin: 18 ng/mL (ref 11–307)

## 2023-07-28 NOTE — Progress Notes (Signed)
 [facility] CONSULT NOTE  Patient Care Team: Ozell Heron HERO, MD as PCP - General (Family Medicine) Georg Gaskins, MD as Referring Physician (Psychiatry) Lynnell Nottingham, MD as Consulting Physician (Dermatology) Armond Cape, MD as Consulting Physician (Obstetrics and Gynecology) Tobie Tonita POUR, DO as Consulting Physician (Neurology)  CHIEF COMPLAINTS/PURPOSE OF CONSULTATION:  IDA  ASSESSMENT & PLAN:   This is a very pleasant 53 year old perimenopausal female patient with chronic iron  deficiency, required intravenous iron  infusion in the past, is on chronic oral iron  supplementation referred back to hematology for consideration of IV iron  since she has persistent iron  deficiency.  Assessment & Plan Iron  deficiency anemia Managed with iron  supplementation every other day to reduce gastrointestinal side effects. - Order CBC, iron  panel and ferritin test.  Choledocholithiasis Recent stone extraction with stent placement.  Chronic gastritis with H. pylori Previously treated with quadruple antibiotics. No follow-up testing for H. pylori eradication. Pt to follow up with GI  General Health Maintenance Due for mammogram within three months. - Schedule mammogram.  Follow-up Plan follow-up based on lab results. Further action if iron  levels are low. - Schedule follow-up appointment for next year if lab results are normal. - Contact if lab results indicate low iron  levels.    HISTORY OF PRESENTING ILLNESS:  Beth Rivera 53 y.o. female is here because of IDA  This is a very pleasant 53 yr old perimenopausal female with chronic IDA, required ferraheme in 2019, Ig A deficiency and trigeminal neuralgia referred to hematology for evaluation of iron  deficiency anemia.    Discussed the use of AI scribe software for clinical note transcription with the patient, who gave verbal consent to proceed.  History of Present Illness Beth Rivera is  a 53 year old female who presents for follow-up regarding her hemoglobin levels.  She feels well and is optimistic about her hemoglobin levels. She has been taking oral iron  supplements every other day to minimize gastrointestinal discomfort. She last received intravenous iron  in 2019 and has managed her condition well since then.  In May 2025, she was hospitalized due to stones in her biliary and pancreatic ducts, leading to a diagnosis of choledocholithiasis. Her gallbladder was removed and a stent was placed to extract the stones. This was her second stent placement, the first occurring eight years ago. She experienced significant pain that led her to the emergency room but has since recovered well from the procedure.  She has a history of gastritis due to H. pylori infection, for which she was treated with a quadruple antibiotic regimen. She had to switch medications due to an allergic reaction to one of the antibiotics, identified as Telsia, though the specific component causing the allergy is unknown. She has not been retested for H. pylori eradication since the treatment.  She reports no brittle nails, hair loss, or blood loss.    MEDICAL HISTORY:  Past Medical History:  Diagnosis Date   Anemia    currently on iron    Anxiety    on meds   Asthma    with respiratory illnesses-uses inhaler PRN   Chronic cholecystitis    Closed fracture of phalanx of left fifth toe with delayed healing 12/14/2017   Complication of anesthesia    PONV   Cough LAST 3 WEEKS   CLEAR SPUTUM, NO FEVER    Depression    on meds   Eczema    Family history of adverse reaction to anesthesia    SON HAS PONV   Guttate  psoriasis 1993   IgA deficiency (HCC) 2019   Neck pain    PMS (premenstrual syndrome)    PONV (postoperative nausea and vomiting)    Poor sleep pattern    Psoriasis    GUTATE, MOSTLY ON LIMBS   Recurrent upper respiratory infection (URI)    Seasonal allergies    Shoulder pain    Sinus  mucosal thickening    Tinnitus    BOTH EARS   Trigeminal neuralgia 03/11/2017    SURGICAL HISTORY: Past Surgical History:  Procedure Laterality Date   BREAST BIOPSY Left 04/02/2022   MM LT BREAST BX W LOC DEV 1ST LESION IMAGE BX SPEC STEREO GUIDE 04/02/2022 [facility] MAMMOGRAPHY   CHOLECYSTECTOMY N/A 02/03/2015   Procedure: LAPAROSCOPIC CHOLECYSTECTOMY WITH INTRAOPERATIVE CHOLANGIOGRAM;  Surgeon: Krystal Spinner, MD;  Location: [facility] ORS;  Service: General;  Laterality: N/A;   ERCP N/A 05/24/2023   Procedure: ERCP, WITH INTERVENTION IF INDICATED;  Surgeon: Wilhelmenia Aloha Raddle., MD;  Location: [facility] ENDOSCOPY;  Service: Gastroenterology;  Laterality: N/A;   UPPER GASTROINTESTINAL ENDOSCOPY  2019   MS   WISDOM TOOTH EXTRACTION      SOCIAL HISTORY: Social History   Socioeconomic History   Marital status: Married    Spouse name: Not on file   Number of children: 2   Years of education: BA   Highest education level: Bachelor's degree (e.g., BA, AB, BS)  Occupational History   Not on file  Tobacco Use   Smoking status: Never   Smokeless tobacco: Never  Vaping Use   Vaping status: Never Used  Substance and Sexual Activity   Alcohol use: Yes    Alcohol/week: 2.0 - 4.0 standard drinks of alcohol    Types: 2 - 4 Standard drinks or equivalent per week    Comment: OCCASIONAL   Drug use: No   Sexual activity: Yes    Partners: Male    Birth control/protection: Surgical    Comment: husband vasectomy   Other Topics Concern   Not on file  Social History Narrative   Fun: Biking, hiking and gardening.   Denies abuse and feels safe at home.    Drinks 1 cup of coffee and green tea a day       Right Handed and Left Handed    Lives in a two story home    Social Drivers of Health   Financial Resource Strain: Low Risk  (03/15/2022)   Overall Financial Resource Strain (CARDIA)    Difficulty of Paying Living Expenses: Not hard at all  Food Insecurity: No Food Insecurity (05/22/2023)   Hunger  Vital Sign    Worried About Running Out of Food in the Last Year: Never true    Ran Out of Food in the Last Year: Never true  Transportation Needs: No Transportation Needs (05/22/2023)   PRAPARE - Administrator, Civil Service (Medical): No    Lack of Transportation (Non-Medical): No  Physical Activity: Sufficiently Active (03/15/2022)   Exercise Vital Sign    Days of Exercise per Week: 2 days    Minutes of Exercise per Session: 90 min  Stress: No Stress Concern Present (03/15/2022)   [employer] of Occupational Health - Occupational Stress Questionnaire    Feeling of Stress : Not at all  Social Connections: Moderately Isolated (03/15/2022)   Social Connection and Isolation Panel    Frequency of Communication with Friends and Family: Once a week    Frequency of Social Gatherings with Friends and Family: Twice a  week    Attends Religious Services: Never    Active Member of Clubs or Organizations: No    Attends Engineer, structural: Not on file    Marital Status: Married  Catering manager Violence: Not At Risk (05/22/2023)   Humiliation, Afraid, Rape, and Kick questionnaire    Fear of Current or Ex-Partner: No    Emotionally Abused: No    Physically Abused: No    Sexually Abused: No    FAMILY HISTORY: Family History  Problem Relation Age of Onset   Thyroid  disease Mother    Hepatitis C Mother        contracted from blood transfusion   Skin cancer Mother    Obesity Father    CAD Father        3 stents   Depression Brother    Thyroid  disease Maternal Aunt    Breast cancer Maternal Aunt    Thyroid  disease Maternal Aunt    Other Paternal Aunt        joint problems   Heart disease Paternal Grandmother    Diabetes Paternal Grandmother    Heart disease Paternal Grandfather    Diabetes Paternal Grandfather    Colon cancer Neg Hx    Colon polyps Neg Hx    Esophageal cancer Neg Hx    Rectal cancer Neg Hx    Stomach cancer Neg Hx     ALLERGIES:  is  allergic to other and talicia  [amoxicill-rifabutin-omeprazole ].  MEDICATIONS:  Current Outpatient Medications  Medication Sig Dispense Refill   amphetamine -dextroamphetamine  (ADDERALL) 10 MG tablet 1 or 2 daily as needed for sleepiness 60 tablet 0   cetirizine (ZYRTEC) 10 MG tablet Take 1 tablet by mouth daily.     cyclobenzaprine  (FLEXERIL ) 10 MG tablet Take 1 tablet (10 mg total) by mouth at bedtime as needed for muscle spasms. 90 tablet 1   DULoxetine  (CYMBALTA ) 60 MG capsule Take 120 mg by mouth daily.     fluticasone -salmeterol (ADVAIR) 250-50 MCG/ACT AEPB Inhale 1 puff into the lungs in the morning and at bedtime. 60 each 11   methylPREDNISolone  (MEDROL  DOSEPAK) 4 MG TBPK tablet Take package as directed. 21 each 0   omeprazole  (PRILOSEC) 40 MG capsule Take 1 capsule (40 mg total) by mouth daily. 30 capsule 3   OTEZLA  30 MG TABS Take 1 tablet by mouth 2 (two) times daily.     traZODone  (DESYREL ) 150 MG tablet Take 150 mg by mouth at bedtime.     No current facility-administered medications for this visit.     PHYSICAL EXAMINATION: ECOG PERFORMANCE STATUS: 0 - Asymptomatic  Vitals:   07/28/23 1031  BP: 121/87  Pulse: 73  Resp: 18  Temp: 98.8 F (37.1 C)  SpO2: 100%   Filed Weights   07/28/23 1031  Weight: 134 lb 1.6 oz (60.8 kg)    GENERAL:alert, no distress and comfortable SKIN: skin color, texture, turgor are normal, no rashes or significant lesions EYES: normal, conjunctiva are pink and non-injected, sclera clear OROPHARYNX:no exudate, no erythema and lips, buccal mucosa, and tongue normal  NECK: supple, thyroid  normal size, non-tender, without nodularity LYMPH:  no palpable lymphadenopathy in the cervical, axillary  LUNGS: clear to auscultation and percussion with normal breathing effort HEART: regular rate & rhythm and no murmurs and no lower extremity edema ABDOMEN:abdomen soft, non-tender and normal bowel sounds Musculoskeletal:no cyanosis of digits and no  clubbing  PSYCH: alert & oriented x 3 with fluent speech NEURO: no focal motor/sensory deficits  LABORATORY  DATA:  I have reviewed the data as listed Lab Results  Component Value Date   WBC 6.5 05/22/2023   HGB 13.3 05/22/2023   HCT 39.3 05/22/2023   MCV 87.5 05/22/2023   PLT 334 05/22/2023     Chemistry      Component Value Date/Time   NA 141 06/17/2023 0000   K 4.4 06/17/2023 0000   CL 101 06/17/2023 0000   CO2 24 06/17/2023 0000   BUN 9 06/17/2023 0000   CREATININE 0.88 06/17/2023 0000   CREATININE 0.87 07/26/2022 0826      Component Value Date/Time   CALCIUM 9.6 06/17/2023 0000   ALKPHOS 87 06/17/2023 1153   AST 23 06/17/2023 1153   AST 17 07/26/2022 0826   ALT 18 06/17/2023 1153   ALT 12 07/26/2022 0826   BILITOT 0.4 06/17/2023 1153   BILITOT 0.3 06/17/2023 0000   BILITOT 0.3 07/26/2022 0826     Labs pending from today  RADIOGRAPHIC STUDIES: I have personally reviewed the radiological images as listed and agreed with the findings in the report. No results found.  All questions were answered. The patient knows to call the clinic with any problems, questions or concerns. I spent 20 minutes in the care of this patient including H and P, review of records, counseling and coordination of care.     Amber Stalls, MD 07/28/2023 11:42 AM

## 2023-08-18 ENCOUNTER — Other Ambulatory Visit: Payer: Self-pay | Admitting: Neurology

## 2023-08-18 DIAGNOSIS — G500 Trigeminal neuralgia: Secondary | ICD-10-CM

## 2023-08-19 ENCOUNTER — Ambulatory Visit: Admitting: Gastroenterology

## 2023-08-23 ENCOUNTER — Other Ambulatory Visit: Payer: Self-pay | Admitting: Family Medicine

## 2023-08-23 DIAGNOSIS — Z1231 Encounter for screening mammogram for malignant neoplasm of breast: Secondary | ICD-10-CM

## 2023-08-27 ENCOUNTER — Other Ambulatory Visit: Payer: Self-pay | Admitting: Medical Genetics

## 2023-08-30 ENCOUNTER — Other Ambulatory Visit: Payer: Self-pay | Admitting: Family Medicine

## 2023-09-01 ENCOUNTER — Ambulatory Visit
Admission: RE | Admit: 2023-09-01 | Discharge: 2023-09-01 | Disposition: A | Discharge status: Home / Self Care | Source: Ambulatory Visit

## 2023-09-01 DIAGNOSIS — Z1231 Encounter for screening mammogram for malignant neoplasm of breast: Secondary | ICD-10-CM

## 2023-09-08 ENCOUNTER — Other Ambulatory Visit: Payer: Self-pay | Admitting: Family Medicine

## 2023-09-08 DIAGNOSIS — R928 Other abnormal and inconclusive findings on diagnostic imaging of breast: Secondary | ICD-10-CM

## 2023-09-15 ENCOUNTER — Other Ambulatory Visit: Payer: Self-pay | Admitting: Family Medicine

## 2023-09-15 ENCOUNTER — Ambulatory Visit
Admission: RE | Admit: 2023-09-15 | Discharge: 2023-09-15 | Disposition: A | Discharge status: Home / Self Care | Source: Ambulatory Visit | Attending: Family Medicine | Admitting: Family Medicine

## 2023-09-15 ENCOUNTER — Other Ambulatory Visit

## 2023-09-15 DIAGNOSIS — R921 Mammographic calcification found on diagnostic imaging of breast: Secondary | ICD-10-CM

## 2023-09-15 DIAGNOSIS — R928 Other abnormal and inconclusive findings on diagnostic imaging of breast: Secondary | ICD-10-CM

## 2023-09-19 ENCOUNTER — Ambulatory Visit
Admission: RE | Admit: 2023-09-19 | Discharge: 2023-09-19 | Disposition: A | Discharge status: Home / Self Care | Source: Ambulatory Visit | Attending: Family Medicine | Admitting: Family Medicine

## 2023-09-19 DIAGNOSIS — R921 Mammographic calcification found on diagnostic imaging of breast: Secondary | ICD-10-CM

## 2023-09-19 HISTORY — PX: BREAST BIOPSY: SHX20

## 2023-09-20 LAB — SURGICAL PATHOLOGY

## 2023-10-19 ENCOUNTER — Ambulatory Visit: Admitting: Gastroenterology
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
## 2023-11-07 ENCOUNTER — Other Ambulatory Visit: Payer: Self-pay | Admitting: Medical Genetics

## 2023-11-07 DIAGNOSIS — Z006 Encounter for examination for normal comparison and control in clinical research program: Secondary | ICD-10-CM

## 2023-11-15 ENCOUNTER — Encounter: Payer: Self-pay | Admitting: Family Medicine

## 2023-11-15 ENCOUNTER — Ambulatory Visit: Payer: Self-pay | Admitting: Family Medicine

## 2023-11-15 ENCOUNTER — Telehealth: Payer: Self-pay

## 2023-11-15 ENCOUNTER — Telehealth: Admitting: Family Medicine

## 2023-11-15 ENCOUNTER — Ambulatory Visit: Admitting: Family Medicine

## 2023-11-15 ENCOUNTER — Ambulatory Visit: Payer: Self-pay

## 2023-11-15 VITALS — BP 110/76 | HR 121 | Temp 101.8°F | Wt 135.4 lb

## 2023-11-15 DIAGNOSIS — R509 Fever, unspecified: Secondary | ICD-10-CM | POA: Diagnosis not present

## 2023-11-15 LAB — POC URINALSYSI DIPSTICK (AUTOMATED)
Bilirubin, UA: NEGATIVE
Blood, UA: POSITIVE
Glucose, UA: NEGATIVE
Ketones, UA: NEGATIVE
Nitrite, UA: POSITIVE
Protein, UA: POSITIVE — AB
Spec Grav, UA: 1.015 (ref 1.010–1.025)
Urobilinogen, UA: 0.2 U/dL
pH, UA: 6 (ref 5.0–8.0)

## 2023-11-15 LAB — CBC WITH DIFFERENTIAL/PLATELET
Basophils Absolute: 0.1 K/uL (ref 0.0–0.1)
Basophils Relative: 0.7 % (ref 0.0–3.0)
Eosinophils Absolute: 0 K/uL (ref 0.0–0.7)
Eosinophils Relative: 0.1 % (ref 0.0–5.0)
HCT: 37.7 % (ref 36.0–46.0)
Hemoglobin: 12.4 g/dL (ref 12.0–15.0)
Lymphocytes Relative: 11.3 % — ABNORMAL LOW (ref 12.0–46.0)
Lymphs Abs: 2.3 K/uL (ref 0.7–4.0)
MCHC: 32.9 g/dL (ref 30.0–36.0)
MCV: 85.8 fl (ref 78.0–100.0)
Monocytes Absolute: 1.7 K/uL — ABNORMAL HIGH (ref 0.1–1.0)
Monocytes Relative: 8.6 % (ref 3.0–12.0)
Neutro Abs: 16.1 K/uL — ABNORMAL HIGH (ref 1.4–7.7)
Neutrophils Relative %: 79.3 % — ABNORMAL HIGH (ref 43.0–77.0)
Platelets: 530 K/uL — ABNORMAL HIGH (ref 150.0–400.0)
RBC: 4.39 Mil/uL (ref 3.87–5.11)
RDW: 13.9 % (ref 11.5–15.5)
WBC: 20.3 K/uL (ref 4.0–10.5)

## 2023-11-15 LAB — COMPREHENSIVE METABOLIC PANEL WITH GFR
ALT: 37 U/L — ABNORMAL HIGH (ref 0–35)
AST: 28 U/L (ref 0–37)
Albumin: 4 g/dL (ref 3.5–5.2)
Alkaline Phosphatase: 192 U/L — ABNORMAL HIGH (ref 39–117)
BUN: 8 mg/dL (ref 6–23)
CO2: 30 meq/L (ref 19–32)
Calcium: 9.5 mg/dL (ref 8.4–10.5)
Chloride: 93 meq/L — ABNORMAL LOW (ref 96–112)
Creatinine, Ser: 0.92 mg/dL (ref 0.40–1.20)
GFR: 71.75 mL/min (ref >60.00)
Glucose, Bld: 101 mg/dL — ABNORMAL HIGH (ref 70–99)
Potassium: 4.1 meq/L (ref 3.5–5.1)
Sodium: 133 meq/L — ABNORMAL LOW (ref 135–145)
Total Bilirubin: 0.4 mg/dL (ref 0.2–1.2)
Total Protein: 8.3 g/dL (ref 6.0–8.3)

## 2023-11-15 LAB — C-REACTIVE PROTEIN: CRP: 23 mg/dL — ABNORMAL HIGH (ref 0.5–20.0)

## 2023-11-15 LAB — SEDIMENTATION RATE: Sed Rate: 92 mm/h — ABNORMAL HIGH (ref 0–30)

## 2023-11-15 MED ORDER — ONDANSETRON HCL 4 MG PO TABS: 4.0000 mg | ORAL_TABLET | Freq: Three times a day (TID) | ORAL | 0 refills | Status: AC | PRN

## 2023-11-15 MED ORDER — CEFDINIR 300 MG PO CAPS: 300.0000 mg | ORAL_CAPSULE | Freq: Two times a day (BID) | ORAL | 0 refills | Status: DC

## 2023-11-15 NOTE — Progress Notes (Signed)
 Established Patient Office Visit  Subjective   Patient ID: Beth Rivera, female    DOB: 12/17/1970  Age: 53 y.o. MRN: [id number]  Chief Complaint  Patient presents with   Fever    HPI   Dulce had called earlier this morning and we actually had brief virtual visit and then decided to convert this to an office.  She relates that around October 31 she had fever until about 4 November up to 101 or so range.  No other major symptoms at that time.  She then had no fever for a few days and then couple days ago had recurrent fever up to 103.8.  Home COVID test negative.  Had some chills and bodyaches but no significant cough.  No urinary symptoms.  No sinus pressure.  She had some mild nausea without vomiting but no abdominal pain.  No localizing flank pain.  No recent tick bites.  No known sick exposures.  Denied any skin rash.  She does have history of chronic asthma but feels like her asthma is stable and no recent obvious wheezing.  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  Denies any significant arthralgias.  Past Medical History:  Diagnosis Date   Anemia    currently on iron    Anxiety    on meds   Asthma    with respiratory illnesses-uses inhaler PRN   Chronic cholecystitis    Closed fracture of phalanx of left fifth toe with delayed healing 12/14/2017   Complication of anesthesia    PONV   Cough LAST 3 WEEKS   CLEAR SPUTUM, NO FEVER    Depression    on meds   Eczema    Family history of adverse reaction to anesthesia    SON HAS PONV   Guttate psoriasis 1993   IgA deficiency (HCC) 2019   Neck pain    PMS (premenstrual syndrome)    PONV (postoperative nausea and vomiting)    Poor sleep pattern    Psoriasis    GUTATE, MOSTLY ON LIMBS   Recurrent upper respiratory infection (URI)    Seasonal allergies    Shoulder pain    Sinus mucosal thickening    Tinnitus    BOTH EARS   Trigeminal neuralgia 03/11/2017   Past Surgical History:  Procedure Laterality Date    BREAST BIOPSY Left 04/02/2022   MM LT BREAST BX W LOC DEV 1ST LESION IMAGE BX SPEC STEREO GUIDE 04/02/2022 [facility]   BREAST BIOPSY Left 09/19/2023   MM LT BREAST BX W LOC DEV 1ST LESION IMAGE BX SPEC STEREO GUIDE 09/19/2023 [facility]   CHOLECYSTECTOMY N/A 02/03/2015   Procedure: LAPAROSCOPIC CHOLECYSTECTOMY WITH INTRAOPERATIVE CHOLANGIOGRAM;  Surgeon: Krystal Spinner, MD;  Location: [facility];  Service: General;  Laterality: N/A;   ERCP N/A 05/24/2023   Procedure: ERCP, WITH INTERVENTION IF INDICATED;  Surgeon: Wilhelmenia Aloha Raddle., MD;  Location: [facility];  Service: Gastroenterology;  Laterality: N/A;   UPPER GASTROINTESTINAL ENDOSCOPY  2019   MS   WISDOM TOOTH EXTRACTION      reports that she has never smoked. She has never used smokeless tobacco. She reports current alcohol use of about 2.0 - 4.0 standard drinks of alcohol per week. She reports that she does not use drugs. family history includes Breast cancer in her maternal aunt; CAD in her father; Depression in her brother; Diabetes in her paternal grandfather and paternal grandmother; Heart disease in her paternal grandfather and paternal grandmother; Hepatitis C in her mother; Obesity in her  father; Other in her paternal aunt; Skin cancer in her mother; Thyroid  disease in her maternal aunt, maternal aunt, and mother. Allergies  Allergen Reactions   Other     Seasonal, dogs and mold cause mucus and sinus issues   Talicia  [Amoxicill-Rifabutin-Omeprazole ]     Lumps-burning tongue  ] Review of Systems  Constitutional:  Positive for chills and fever.  HENT:  Negative for sinus pain and sore throat.   Respiratory:  Negative for cough and shortness of breath.   Gastrointestinal:  Positive for nausea. Negative for abdominal pain, blood in stool, diarrhea and vomiting.  Genitourinary:  Negative for dysuria, flank pain and hematuria.  Skin:  Negative for rash.  Neurological:  Negative for headaches.      Objective:      BP 110/76   Pulse (!) 121   Temp (!) 101.8 F (38.8 C) (Oral)   Wt 135 lb 6.4 oz (61.4 kg)   SpO2 95%   BMI 21.85 kg/m  BP Readings from Last 3 Encounters:  11/15/23 110/76  07/28/23 121/87  07/01/23 104/70   Wt Readings from Last 3 Encounters:  11/15/23 135 lb 6.4 oz (61.4 kg)  07/28/23 134 lb 1.6 oz (60.8 kg)  07/01/23 135 lb 8 oz (61.5 kg)      Physical Exam Vitals reviewed.  Constitutional:      General: She is not in acute distress.    Appearance: She is not ill-appearing.  HENT:     Right Ear: Tympanic membrane and external ear normal.     Left Ear: Tympanic membrane and external ear normal.     Mouth/Throat:     Mouth: Mucous membranes are moist.     Pharynx: Oropharynx is clear. No oropharyngeal exudate or posterior oropharyngeal erythema.  Cardiovascular:     Rate and Rhythm: Normal rate and regular rhythm.  Pulmonary:     Effort: Pulmonary effort is normal.     Breath sounds: Normal breath sounds. No wheezing or rales.  Abdominal:     General: Bowel sounds are normal. There is no distension.     Palpations: Abdomen is soft.     Tenderness: There is no abdominal tenderness. There is no guarding.  Musculoskeletal:     Cervical back: Neck supple.  Lymphadenopathy:     Cervical: No cervical adenopathy.  Skin:    Findings: No rash.  Neurological:     Mental Status: She is alert.      Results for orders placed or performed in visit on 11/15/23  POCT Urinalysis Dipstick (Automated)  Result Value Ref Range   Color, UA Yellow    Clarity, UA Clear    Glucose, UA Negative Negative   Bilirubin, UA Negative    Ketones, UA Negative    Spec Grav, UA 1.015 1.010 - 1.025   Blood, UA Positive    pH, UA 6.0 5.0 - 8.0   Protein, UA Positive (A) Negative   Urobilinogen, UA 0.2 0.2 or 1.0 E.U./dL   Nitrite, UA Positive    Leukocytes, UA Large (3+) (A) Negative      The 10-year ASCVD risk score (Arnett DK, et al., 2019) is: 1.1%    Assessment & Plan:    Problem List Items Addressed This Visit   None Visit Diagnoses       Fever, unspecified fever cause    -  Primary   Relevant Orders   POCT Urinalysis Dipstick (Automated) (Completed)   Urine Culture   Urine Culture   CBC  with Differential/Platelet   CMP   C-reactive Protein   Sedimentation rate     Prolonged fever.  Patient really has paucity of symptoms other than fever and chills and some bodyaches.  We discussed breath of possibilities for fever including infectious, autoimmune, malignancy.  She has nonfocal exam and is nontoxic in appearance.  Symptoms have been slightly less than 2 weeks in duration.  Her urine dipstick in office did show leukocytes, blood, and nitrites.  Urine culture sent.  She does not have any flank tenderness.  Obtain additional labs including CBC with differential, CMP, sed rate, CRP.  Consider coverage with cefdinir 300 mg twice daily pending culture result. Follow-up immediately for any new symptoms.  We sent in Zofran  4 mg every 8 hours as needed for nausea  No follow-ups on file.    Wolm Scarlet, MD

## 2023-11-15 NOTE — Telephone Encounter (Signed)
 FYI Only or Action Required?: FYI only for provider: appointment scheduled on 11/15/2023 virtual per patient request.  Patient was last seen in primary care on 07/01/2023 by Ozell Heron HERO, MD.  Called Nurse Triage reporting Fever.  Symptoms began weekend of 11/04/2023 and worsening.  Interventions attempted: Nothing.  Symptoms are: gradually worsening.  Triage Disposition: See HCP Within 4 Hours (Or PCP Triage)  Patient/caregiver understands and will follow disposition?: Yes   Copied from CRM [id number]. Topic: Clinical - Red Word Triage >> Nov 15, 2023 10:54 AM Leila BROCKS wrote: Red Word that prompted transfer to Nurse Triage: Patient [phone] states has a fever since Halloween weekend, last night 104.5 and has a fever 102.6 now and wants to have a virtual visit with Dr. Heron Ozell. Patient states body aches, nausea, chills, shivers, headaches, and wheezing. Patient is negative for Covid. Patient called pulmonary and needs to to go to primary. Please advise. Reason for Disposition  Fever > 104 F (40 C)  Answer Assessment - Initial Assessment Questions 1. TEMPERATURE: What is the most recent temperature?  How was it measured?      102.6 2. ONSET: When did the fever start?      Ongoing since 11/04/2023 weekend and worsening 3. CHILLS: Do you have chills? If yes: How bad are they?  (e.g., none, mild, moderate, severe)     Moderate to severe 4. OTHER SYMPTOMS: Do you have any other symptoms besides the fever?  (e.g., abdomen pain, cough, diarrhea, earache, headache, sore throat, urination pain)     Headache, wheezing, body aches 5. CAUSE: If there are no symptoms, ask: What do you think is causing the fever?      no 6. CONTACTS: Does anyone else in the family have an infection?     na 7. TREATMENT: What have you done so far to treat this fever? (e.g., OTC fever medicines)     na 8. IMMUNOCOMPROMISE: Do you have any of the following: diabetes, HIV  positive, splenectomy, cancer chemotherapy, chronic steroid treatment, transplant patient, etc.?     na 9. PREGNANCY: Is there any chance you are pregnant? When was your last menstrual period?     na 10. TRAVEL: Have you traveled out of the country in the last month? (e.g., travel history, exposures)       na  Protocols used: [facility]

## 2023-11-15 NOTE — Telephone Encounter (Signed)
 Appt scheduled

## 2023-11-15 NOTE — Telephone Encounter (Signed)
 CRITICAL VALUE STICKER  CRITICAL VALUE: WBC 20.3   RECEIVER (on-site recipient of call):  Beth Rivera   DATE & TIME NOTIFIED: 11/15/2023 4:00  MESSENGER (representative from lab): Harlene   MD NOTIFIED: Via epic  TIME OF NOTIFICATION: 4:03   RESPONSE:  N/A

## 2023-11-16 NOTE — Progress Notes (Signed)
 Patient ID: Beth Rivera, female   DOB: 07-06-1970, 53 y.o.   MRN: [id number]  This virtual visit was converted to in office.  Wolm LELON Scarlet MD [facility]

## 2023-11-17 LAB — URINE CULTURE
MICRO NUMBER:: 17219880
SPECIMEN QUALITY:: ADEQUATE

## 2023-11-21 ENCOUNTER — Encounter: Payer: Self-pay | Admitting: Family Medicine

## 2023-12-13 ENCOUNTER — Encounter: Payer: Self-pay | Admitting: Gastroenterology

## 2023-12-13 ENCOUNTER — Other Ambulatory Visit

## 2023-12-13 ENCOUNTER — Ambulatory Visit: Admitting: Gastroenterology

## 2023-12-13 VITALS — BP 118/68 | HR 105 | Ht 66.0 in | Wt 140.0 lb

## 2023-12-13 DIAGNOSIS — K649 Unspecified hemorrhoids: Secondary | ICD-10-CM

## 2023-12-13 DIAGNOSIS — K625 Hemorrhage of anus and rectum: Secondary | ICD-10-CM

## 2023-12-13 DIAGNOSIS — Z8619 Personal history of other infectious and parasitic diseases: Secondary | ICD-10-CM

## 2023-12-13 DIAGNOSIS — R7989 Other specified abnormal findings of blood chemistry: Secondary | ICD-10-CM

## 2023-12-13 DIAGNOSIS — K8050 Calculus of bile duct without cholangitis or cholecystitis without obstruction: Secondary | ICD-10-CM

## 2023-12-13 LAB — COMPREHENSIVE METABOLIC PANEL WITH GFR
ALT: 14 U/L (ref 0–35)
AST: 18 U/L (ref 0–37)
Albumin: 4.2 g/dL (ref 3.5–5.2)
Alkaline Phosphatase: 88 U/L (ref 39–117)
BUN: 12 mg/dL (ref 6–23)
CO2: 32 meq/L (ref 19–32)
Calcium: 9.1 mg/dL (ref 8.4–10.5)
Chloride: 100 meq/L (ref 96–112)
Creatinine, Ser: 0.77 mg/dL (ref 0.40–1.20)
GFR: 88.79 mL/min (ref >60.00)
Glucose, Bld: 81 mg/dL (ref 70–99)
Potassium: 4.3 meq/L (ref 3.5–5.1)
Sodium: 140 meq/L (ref 135–145)
Total Bilirubin: 0.2 mg/dL (ref 0.2–1.2)
Total Protein: 7.3 g/dL (ref 6.0–8.3)

## 2023-12-13 NOTE — Patient Instructions (Addendum)
 Your provider has requested that you go to the basement level for lab work before leaving today. Press B on the elevator. The lab is located at the first door on the left as you exit the elevator.   Your provider has ordered Diatherix stool testing for you. You have received a kit from our office today containing all necessary supplies to complete this test. Please carefully read the stool collection instructions provided in the kit before opening the accompanying materials. In addition, be sure there is a label providing your full name and date of birth on the puritan opti-swab tube that is supplied in the kit (if you do not see a label with this information on your test tube, please make us  aware before test collection!). After completing the test, you should secure the purtian tube into the specimen biohazard bag. The [facility] E-Req sheet (including date and time of specimen collection) should be placed into the outside pocket of the specimen biohazard bag and returned to the [facility] lab (basement floor of [facility]) within 3 days of collection. Please make sure to give the specimen to a staff member at the lab. DO NOT leave the specimen on the counter.   If the specimen date and time (can be found in the upper right boxed portion of the sheet) are not filled out on the E-Req sheet, the test will NOT be performed.   You will be due for a recall colonoscopy in 2030 . We will send you a reminder in the mail when it gets closer to that time.  _______________________________________________________  If your blood pressure at your visit was 140/90 or greater, please contact your primary care physician to follow up on this.  _______________________________________________________  If you are age 52 or older, your body mass index should be between 23-30. Your Body mass index is 22.6 kg/m. If this is out of the aforementioned range listed, please consider follow  up with your Primary Care Provider.  If you are age 52 or younger, your body mass index should be between 19-25. Your Body mass index is 22.6 kg/m. If this is out of the aformentioned range listed, please consider follow up with your Primary Care Provider.   ________________________________________________________  The [facility] providers would like to encourage you to use MYCHART to communicate with providers for non-urgent requests or questions.  Due to long hold times on the telephone, sending your provider a message by [facility] may be a faster and more efficient way to get a response.  Please allow 48 business hours for a response.  Please remember that this is for non-urgent requests.  _______________________________________________________  [facility] is using a team-based approach to care.  Your team is made up of your doctor and two to three APPS. Our APPS (Nurse Practitioners and Physician Assistants) work with your physician to ensure care continuity for you. They are fully qualified to address your health concerns and develop a treatment plan. They communicate directly with your gastroenterologist to care for you. Seeing the Advanced Practice Practitioners on your physician's team can help you by facilitating care more promptly, often allowing for earlier appointments, access to diagnostic testing, procedures, and other specialty referrals.   Due to recent changes in healthcare laws, you may see the results of your imaging and laboratory studies on MyChart before your provider has had a chance to review them.  We understand that in some cases there may be results that are confusing or concerning  to you. Not all laboratory results come back in the same time frame and the provider may be waiting for multiple results in order to interpret others.  Please give us  48 hours in order for your provider to thoroughly review all the results before contacting the office for clarification of  your results.   Thank you for choosing me and [facility].  Dr. Wilhelmenia

## 2023-12-13 NOTE — Progress Notes (Unsigned)
 GASTROENTEROLOGY OUTPATIENT CLINIC VISIT   Primary Care Provider Ozell Heron HERO, MD [address] [place] [address] [phone]  Referring Provider Ozell Heron HERO, MD [address] [place],  [place] [address] [phone]  Patient Profile: Beth Rivera is a 53 y.o. female with a pmh significant for  The patient presents to the [facility] for an evaluation and management of problem(s) noted below:  Problem List No diagnosis found.  History of Present Illness    The patient does/does not take NSAIDs or BC/Goody Powder. Patient has/has not had an EGD. Patient has/has not had a Colonoscopy.  GI Review of Systems Positive as above Negative for  Pyrosis; Reflux; Regurgitation; Dysphagia; Odynophagia; Globus; Post-prandial cough; Nocturnal cough; Nasal regurgitation; Epigastric pain; Nausea; Vomiting; Hematemesis; Jaundice; Change in Appetite; Early satiety; Abdominal pain; Abdominal bloating; Eructation; Flatulence; Change in BM Frequency; Change in BM Consistency; Constipation; Diarrhea; Incontinence; Urgency; Tenesmus; Hematochezia; Melena  Review of Systems General: Denies fevers/chills/weight loss unintentionally Cardiovascular: Denies chest pain Pulmonary: Denies shortness of breath Gastroenterological: See HPI Genitourinary: Denies darkened urine Hematological: Denies easy bruising/bleeding Endocrine: Denies temperature intolerance Dermatological: Denies jaundice Psychological: Mood is stable  Medications Current Outpatient Medications  Medication Sig Dispense Refill   amphetamine -dextroamphetamine  (ADDERALL) 10 MG tablet 1 or 2 daily as needed for sleepiness 60 tablet 0   cefdinir  (OMNICEF ) 300 MG capsule Take 1 capsule (300 mg total) by mouth 2 (two) times daily. 14 capsule 0   cetirizine (ZYRTEC) 10 MG tablet Take 1 tablet by mouth daily.     cyclobenzaprine  (FLEXERIL ) 10 MG tablet TAKE 1 TABLET BY  MOUTH AT BEDTIME AS NEEDED FOR MUSCLE SPASMS 90 tablet 1   DULoxetine  (CYMBALTA ) 60 MG capsule Take 120 mg by mouth daily.     fluticasone -salmeterol (ADVAIR) 250-50 MCG/ACT AEPB Inhale 1 puff into the lungs in the morning and at bedtime. 60 each 11   methylPREDNISolone  (MEDROL  DOSEPAK) 4 MG TBPK tablet Take package as directed. 21 each 0   omeprazole  (PRILOSEC) 40 MG capsule TAKE 1 CAPSULE (40 MG TOTAL) BY MOUTH DAILY. 90 capsule 1   ondansetron  (ZOFRAN ) 4 MG tablet Take 1 tablet (4 mg total) by mouth every 8 (eight) hours as needed for nausea or vomiting. 15 tablet 0   OTEZLA  30 MG TABS Take 1 tablet by mouth 2 (two) times daily.     traZODone  (DESYREL ) 150 MG tablet Take 150 mg by mouth at bedtime.     busPIRone (BUSPAR) 5 MG tablet Take by mouth.     Cyanocobalamin (VITAMIN B12) 1000 MCG TBCR 1 tablet Orally Once a day; Duration: 30 day(s)     Ferrous Sulfate (IRON ) 325 (65 Fe) MG TABS 1 tablet Orally Once a day; Duration: 30 day(s)     fluticasone  (FLONASE ) 50 MCG/ACT nasal spray Nasal; Duration: 84     Magnesium 300 MG CAPS 1 capsule with a meal Orally Once a day; Duration: 30 day(s)     montelukast  (SINGULAIR ) 10 MG tablet Oral; Duration: 90     No current facility-administered medications for this visit.    Allergies Allergies  Allergen Reactions   Other     Seasonal, dogs and mold cause mucus and sinus issues   Talicia  [Amoxicill-Rifabutin-Omeprazole ]     Lumps-burning tongue    Histories Past Medical History:  Diagnosis Date   Anemia    currently on iron    Anxiety    on meds   Asthma    with respiratory illnesses-uses inhaler  PRN   Chronic cholecystitis    Closed fracture of phalanx of left fifth toe with delayed healing 12/14/2017   Complication of anesthesia    PONV   Cough LAST 3 WEEKS   CLEAR SPUTUM, NO FEVER    Depression    on meds   Eczema    Family history of adverse reaction to anesthesia    SON HAS PONV   Guttate psoriasis 1993   IgA deficiency  (HCC) 2019   Neck pain    PMS (premenstrual syndrome)    PONV (postoperative nausea and vomiting)    Poor sleep pattern    Psoriasis    GUTATE, MOSTLY ON LIMBS   Recurrent upper respiratory infection (URI)    Seasonal allergies    Shoulder pain    Sinus mucosal thickening    Tinnitus    BOTH EARS   Trigeminal neuralgia 03/11/2017   Past Surgical History:  Procedure Laterality Date   BREAST BIOPSY Left 04/02/2022   MM LT BREAST BX W LOC DEV 1ST LESION IMAGE BX SPEC STEREO GUIDE 04/02/2022 [facility]   BREAST BIOPSY Left 09/19/2023   MM LT BREAST BX W LOC DEV 1ST LESION IMAGE BX SPEC STEREO GUIDE 09/19/2023 [facility]   CHOLECYSTECTOMY N/A 02/03/2015   Procedure: LAPAROSCOPIC CHOLECYSTECTOMY WITH INTRAOPERATIVE CHOLANGIOGRAM;  Surgeon: Krystal Spinner, MD;  Location: [facility];  Service: General;  Laterality: N/A;   ERCP N/A 05/24/2023   Procedure: ERCP, WITH INTERVENTION IF INDICATED;  Surgeon: Wilhelmenia Aloha Raddle., MD;  Location: [facility] END[facility]COPY;  Service: Gastroenterology;  Laterality: N/A;   UPPER GASTROINTESTINAL ENDOSCOPY  2019   MS   WISDOM TOOTH EXTRACTION     Social History   Socioeconomic History   Marital status: Married    Spouse name: Not on file   Number of children: 2   Years of education: BA   Highest education level: Bachelor's degree (e.g., BA, AB, BS)  Occupational History   Not on file  Tobacco Use   Smoking status: Never   Smokeless tobacco: Never  Vaping Use   Vaping status: Never Used  Substance and Sexual Activity   Alcohol use: Yes    Alcohol/week: 2.0 - 4.0 standard drinks of alcohol    Types: 2 - 4 Standard drinks or equivalent per week    Comment: OCCASIONAL   Drug use: No   Sexual activity: Yes    Partners: Male    Birth control/protection: Surgical    Comment: husband vasectomy   Other Topics Concern   Not on file  Social History Narrative   Fun: Biking, hiking and gardening.   Denies abuse and feels safe at home.    Drinks 1  cup of coffee and green tea a day       Right Handed and Left Handed    Lives in a two story home    Social Drivers of Health   Financial Resource Strain: Low Risk  (03/15/2022)   Overall Financial Resource Strain (CARDIA)    Difficulty of Paying Living Expenses: Not hard at all  Food Insecurity: No Food Insecurity (05/22/2023)   Hunger Vital Sign    Worried About Running Out of Food in the Last Year: Never true    Ran Out of Food in the Last Year: Never true  Transportation Needs: No Transportation Needs (05/22/2023)   PRAPARE - Administrator, Civil Service (Medical): No    Lack of Transportation (Non-Medical): No  Physical Activity: Sufficiently Active (03/15/2022)  Exercise Vital Sign    Days of Exercise per Week: 2 days    Minutes of Exercise per Session: 90 min  Stress: No Stress Concern Present (03/15/2022)   [employer] of Occupational Health - Occupational Stress Questionnaire    Feeling of Stress : Not at all  Social Connections: Moderately Isolated (03/15/2022)   Social Connection and Isolation Panel    Frequency of Communication with Friends and Family: Once a week    Frequency of Social Gatherings with Friends and Family: Twice a week    Attends Religious Services: Never    Database Administrator or Organizations: No    Attends Engineer, Structural: Not on file    Marital Status: Married  Catering Manager Violence: Not At Risk (05/22/2023)   Humiliation, Afraid, Rape, and Kick questionnaire    Fear of Current or Ex-Partner: No    Emotionally Abused: No    Physically Abused: No    Sexually Abused: No   Family History  Problem Relation Age of Onset   Thyroid  disease Mother    Hepatitis C Mother        contracted from blood transfusion   Skin cancer Mother    Obesity Father    CAD Father        3 stents   Depression Brother    Thyroid  disease Maternal Aunt    Breast cancer Maternal Aunt    Thyroid  disease Maternal Aunt    Other  Paternal Aunt        joint problems   Heart disease Paternal Grandmother    Diabetes Paternal Grandmother    Heart disease Paternal Grandfather    Diabetes Paternal Grandfather    Colon cancer Neg Hx    Colon polyps Neg Hx    Esophageal cancer Neg Hx    Rectal cancer Neg Hx    Stomach cancer Neg Hx    I have reviewed her medical, social, and family history in detail and updated the electronic medical record as necessary.    PHYSICAL EXAMINATION  BP 118/68   Pulse (!) 105   Ht 5' 6 (1.676 m)   Wt 140 lb (63.5 kg)   BMI 22.60 kg/m  Wt Readings from Last 3 Encounters:  12/13/23 140 lb (63.5 kg)  11/15/23 135 lb 6.4 oz (61.4 kg)  07/28/23 134 lb 1.6 oz (60.8 kg)   GEN: NAD, appears stated age, doesn't appear chronically ill PSYCH: Cooperative, without pressured speech EYE: Conjunctivae pink, sclerae anicteric ENT: MMM CV: Nontachycardic RESP: No audible wheezing GI: NABS, soft, NT/ND, without rebound or guarding, no HSM appreciated GU: DRE shows MSK/EXT: No significant lower extremity edema SKIN: No jaundice, no spider angiomata NEURO:  Alert & Oriented x 3, no focal deficits, no evidence of asterixis   REVIEW OF DATA  I reviewed the following data at the time of this encounter:  GI Procedures and Studies  ***  Laboratory Studies  ***  Imaging Studies  ***   ASSESSMENT  Ms. Broman is a 53 y.o. female.  The patient is seen today for evaluation and management of:  No diagnosis found.  ***   PLAN  There are no diagnoses linked to this encounter.   No orders of the defined types were placed in this encounter.   New Prescriptions   No medications on file   Modified Medications   No medications on file    Planned Follow Up No follow-ups on file.   Total Time in Face-to-Face  and in Coordination of Care for patient including independent/personal interpretation/review of prior testing, medical history, examination, medication adjustment,  communicating results with the patient directly, and documentation within the EHR is ***.   Aloha Finner, MD [facility] Office # [phone]

## 2023-12-14 ENCOUNTER — Encounter: Payer: Self-pay | Admitting: Gastroenterology

## 2023-12-14 ENCOUNTER — Ambulatory Visit: Payer: Self-pay | Admitting: Gastroenterology

## 2023-12-14 DIAGNOSIS — R7989 Other specified abnormal findings of blood chemistry: Secondary | ICD-10-CM | POA: Insufficient documentation

## 2023-12-14 DIAGNOSIS — Z8619 Personal history of other infectious and parasitic diseases: Secondary | ICD-10-CM | POA: Insufficient documentation

## 2023-12-14 DIAGNOSIS — K625 Hemorrhage of anus and rectum: Secondary | ICD-10-CM | POA: Insufficient documentation

## 2023-12-14 DIAGNOSIS — K649 Unspecified hemorrhoids: Secondary | ICD-10-CM | POA: Insufficient documentation

## 2023-12-14 DIAGNOSIS — K760 Fatty (change of) liver, not elsewhere classified: Secondary | ICD-10-CM | POA: Insufficient documentation

## 2024-02-08 ENCOUNTER — Ambulatory Visit: Payer: No Typology Code available for payment source | Admitting: Neurology

## 2024-02-08 ENCOUNTER — Encounter: Payer: Self-pay | Admitting: Neurology

## 2024-02-08 VITALS — BP 132/84 | HR 74 | Ht 66.0 in | Wt 144.0 lb

## 2024-02-08 DIAGNOSIS — M542 Cervicalgia: Secondary | ICD-10-CM | POA: Diagnosis not present

## 2024-02-08 DIAGNOSIS — G500 Trigeminal neuralgia: Secondary | ICD-10-CM | POA: Diagnosis not present

## 2024-02-08 NOTE — Progress Notes (Signed)
 "   Follow-up Visit   Date: 02/08/2024    Beth Rivera MRN: [id number] DOB: February 07, 1970    Beth Rivera is a 54 y.o. Caucasian female returning to the clinic for follow-up of left trigeminal neuralgia and new neck pain.  The patient was accompanied to the clinic by self.   IMPRESSION/PLAN: Left trigeminal neuralgia, well-controlled  - Continue Cymbalta  60mg  BID  Cervicalgia, improved with PT. - OK to take flexeril  5mg  daily as needed  - Continue home exercises  Return to clinic as needed  --------------------------------------------- History of present illness: She was diagnosed with left trigeminal neuralgia in 2019. MRI brain was negative for trigeminal nerve compression. She has tried carbamazepine, gabapentin, and Cymbalta . She has been taking Cymbalta  60mg  BID since January 2022 which has helped, but she continues to have daily episodic left facial pain lasting about 20 seconds.  She also complains of generalized pain, numbness, tingling involving all the limbs, worse on the right side. No weakness. MRI cervical spine from 2021 shows degenerative spondylosis with canal stenosis at C3-4 through C5-6, along with biforaminal narrowing at C4-5, right C5-6, and bilateral C6-7.  She has not done physical therapy.   She does not work.  Nonsmoker. Drink alcohol several times per week.   UPDATE 09/14/2021:  She continues to have episodic pain flares with left trigeminal neuralgia, which is overall stable.  Today, she complains of new numbness of the 3rd and 4th fingers which has been constant since August 1st.  She noticed the numbness during a 3-hr road trip.  She denies weakness of the hand.  She has chronic neck pain, no shooting pain down the arms.  UPDATE 03/15/2022:  She is here for follow-up.   She is doing well and reports that facial pain has improved.  Pain typically occurs about twice per week.  She remains on Cymbalta  60mg  BID and tolerating it well.   Her neck pain and right hand tingling has significantly improved with PT, dry needling, and flexeril  10mg .  She takes flexeril  10mg  qhs about 2-3 times per week as needed.  No new complaints.   UPDATE 02/07/2023:  She is her for follow-up.  Her facial pain has been under good control on Cymbalta  60mg  twice diaily.  Since November, she has left sided neck soreness, pain, and reduced ROM.  She has tried flexeril  10mg  as needed, which has not provided much relief.   UPDATE 02/08/2024:  She is here for follow-up visit.  Her facial pain is well-controlled on Cymbalta  60mg  twice daily.   She continues to go to PT which has helped her neck pain.  She uses flexeril  5mg  1-2 tablets every 3 weeks when her pain is severe, but overall is using this much less.    Medications:  Current Outpatient Medications on File Prior to Visit  Medication Sig Dispense Refill   amphetamine -dextroamphetamine  (ADDERALL) 10 MG tablet 1 or 2 daily as needed for sleepiness 60 tablet 0   busPIRone (BUSPAR) 5 MG tablet Take by mouth.     cetirizine (ZYRTEC) 10 MG tablet Take 1 tablet by mouth daily.     Cyanocobalamin (VITAMIN B12) 1000 MCG TBCR 1 tablet Orally Once a day; Duration: 30 day(s)     cyclobenzaprine  (FLEXERIL ) 10 MG tablet TAKE 1 TABLET BY MOUTH AT BEDTIME AS NEEDED FOR MUSCLE SPASMS 90 tablet 1   DULoxetine  (CYMBALTA ) 60 MG capsule Take 120 mg by mouth daily.     Ferrous Sulfate (IRON ) 325 (65 Fe) MG  TABS 1 tablet Orally Once a day; Duration: 30 day(s)     fluticasone -salmeterol (ADVAIR) 250-50 MCG/ACT AEPB Inhale 1 puff into the lungs in the morning and at bedtime. 60 each 11   Magnesium 300 MG CAPS 1 capsule with a meal Orally Once a day; Duration: 30 day(s)     montelukast  (SINGULAIR ) 10 MG tablet Oral; Duration: 90     omeprazole  (PRILOSEC) 40 MG capsule TAKE 1 CAPSULE (40 MG TOTAL) BY MOUTH DAILY. 90 capsule 1   ondansetron  (ZOFRAN ) 4 MG tablet Take 1 tablet (4 mg total) by mouth every 8 (eight) hours as needed for  nausea or vomiting. 15 tablet 0   OTEZLA  30 MG TABS Take 1 tablet by mouth 2 (two) times daily.     traZODone  (DESYREL ) 150 MG tablet Take 150 mg by mouth at bedtime.     No current facility-administered medications on file prior to visit.    Allergies:  Allergies  Allergen Reactions   Other     Seasonal, dogs and mold cause mucus and sinus issues   Talicia  [Amoxicill-Rifabutin-Omeprazole ]     Lumps-burning tongue    Vital Signs:  BP 132/84   Pulse 74   Ht 5' 6 (1.676 m)   Wt 144 lb (65.3 kg)   SpO2 98%   BMI 23.24 kg/m    Neurological Exam: MENTAL STATUS including orientation to time, place, person, recent and remote memory, attention span and concentration, language, and fund of knowledge is normal.  Speech is not dysarthric.  CRANIAL NERVES:   Pupils equal round and reactive to light.  Normal conjugate, extra-ocular eye movements in all directions of gaze.  No ptosis.  Facial sensation is intact. Face is symmetric.   MOTOR:  Motor strength is 5/5 in all extremities.    SENSORY:  Vibration intact throughout   REFLEXES:  Reflexes are 2+/4 throughout  COORDINATION/GAIT:    Gait narrow based and stable.   Data: NCS/EMG of the left arm and leg 01/13/2021:  normal  MRI brain wwo contrast 07/25/2017: 1. No acute intracranial abnormality. Please note that this exam does not evaluate the cranial nerves in detail.  2. Circumferential mucosal thickening at the left maxillary sinus. Correlate for sinus disease.  MRI brain wwo contrast 07/25/2017: Normal pre- and postcontrast MRI of the brain. No abnormality to correspond with left sided trigeminal neuralgia.   There is symmetric abnormal signal intensity hyperintense on T2 in the premaxillary soft tissues, above the upper lip and above the nasal bridge most consistent with prior cosmetic injections. Correlate clinically.  MRI cervical spine 08/31/2019: 1. Degenerative spondylosis from C3-4 through C6-7. Canal narrowing with AP  diameter of the canal 8.8 mm at the C3-4 level, 7.6 mm at  C4-5, 7.4 mm at C5-6 and 7.8 mm at C6-7. Mild cord deformity on the right at the C5-6 level. No abnormal cord signal.  2. Foraminal narrowing that could cause neural compression bilaterally at C4-5, on the right at C5-6 and bilateral at C6-7.  3. Mild discogenic endplate marrow changes at C4-5 and C5-6 that could relate to neck pain.   NCS/EMG of the right arm 09/29/2021:  Normal   Thank you for allowing me to participate in patient's care.  If I can answer any additional questions, I would be pleased to do so.    Sincerely,    Maxwell Martorano K. Tobie, DO  "

## 2024-07-31 ENCOUNTER — Ambulatory Visit: Admitting: Hematology and Oncology
